# Patient Record
Sex: Male | Born: 1945 | Race: White | Hispanic: No | Marital: Married | Smoking: Never smoker
Health system: Southern US, Community
[De-identification: ages and names within clinical notes are randomized; demographics above are authoritative.]

## PROBLEM LIST (undated history)

## (undated) DIAGNOSIS — I480 Paroxysmal atrial fibrillation: Secondary | ICD-10-CM

## (undated) DIAGNOSIS — I441 Atrioventricular block, second degree: Secondary | ICD-10-CM

## (undated) DIAGNOSIS — I10 Essential (primary) hypertension: Secondary | ICD-10-CM

## (undated) DIAGNOSIS — E7800 Pure hypercholesterolemia, unspecified: Secondary | ICD-10-CM

## (undated) DIAGNOSIS — J90 Pleural effusion, not elsewhere classified: Secondary | ICD-10-CM

## (undated) HISTORY — DX: Paroxysmal atrial fibrillation: I48.0

## (undated) HISTORY — PX: BIOPSY PROSTATE: PRO28

## (undated) HISTORY — DX: Atrioventricular block, second degree: I44.1

## (undated) HISTORY — DX: Pleural effusion, not elsewhere classified: J90

## (undated) HISTORY — PX: COLONOSCOPY: SHX174

---

## 2014-08-10 DIAGNOSIS — G4710 Hypersomnia, unspecified: Secondary | ICD-10-CM

## 2014-08-10 DIAGNOSIS — G4733 Obstructive sleep apnea (adult) (pediatric): Secondary | ICD-10-CM | POA: Insufficient documentation

## 2014-08-10 DIAGNOSIS — R0683 Snoring: Secondary | ICD-10-CM | POA: Insufficient documentation

## 2014-08-10 HISTORY — DX: Obstructive sleep apnea (adult) (pediatric): G47.33

## 2014-08-10 HISTORY — DX: Snoring: R06.83

## 2014-08-10 HISTORY — DX: Hypersomnia, unspecified: G47.10

## 2016-02-14 DIAGNOSIS — G4733 Obstructive sleep apnea (adult) (pediatric): Secondary | ICD-10-CM | POA: Diagnosis not present

## 2016-02-21 DIAGNOSIS — G4710 Hypersomnia, unspecified: Secondary | ICD-10-CM | POA: Diagnosis not present

## 2016-02-21 DIAGNOSIS — G4733 Obstructive sleep apnea (adult) (pediatric): Secondary | ICD-10-CM | POA: Diagnosis not present

## 2016-02-27 DIAGNOSIS — H348312 Tributary (branch) retinal vein occlusion, right eye, stable: Secondary | ICD-10-CM | POA: Diagnosis not present

## 2016-05-29 DIAGNOSIS — N182 Chronic kidney disease, stage 2 (mild): Secondary | ICD-10-CM | POA: Diagnosis not present

## 2016-05-29 DIAGNOSIS — Z87898 Personal history of other specified conditions: Secondary | ICD-10-CM | POA: Diagnosis not present

## 2016-05-29 DIAGNOSIS — E785 Hyperlipidemia, unspecified: Secondary | ICD-10-CM | POA: Diagnosis not present

## 2016-05-29 DIAGNOSIS — Z79899 Other long term (current) drug therapy: Secondary | ICD-10-CM | POA: Diagnosis not present

## 2016-05-29 DIAGNOSIS — I129 Hypertensive chronic kidney disease with stage 1 through stage 4 chronic kidney disease, or unspecified chronic kidney disease: Secondary | ICD-10-CM | POA: Diagnosis not present

## 2016-09-11 DIAGNOSIS — Z23 Encounter for immunization: Secondary | ICD-10-CM | POA: Diagnosis not present

## 2016-09-25 DIAGNOSIS — E785 Hyperlipidemia, unspecified: Secondary | ICD-10-CM | POA: Diagnosis not present

## 2016-09-25 DIAGNOSIS — I10 Essential (primary) hypertension: Secondary | ICD-10-CM | POA: Diagnosis not present

## 2016-09-25 DIAGNOSIS — Z87898 Personal history of other specified conditions: Secondary | ICD-10-CM | POA: Diagnosis not present

## 2016-09-25 DIAGNOSIS — M79674 Pain in right toe(s): Secondary | ICD-10-CM | POA: Diagnosis not present

## 2016-12-25 DIAGNOSIS — Z125 Encounter for screening for malignant neoplasm of prostate: Secondary | ICD-10-CM | POA: Diagnosis not present

## 2016-12-25 DIAGNOSIS — E785 Hyperlipidemia, unspecified: Secondary | ICD-10-CM | POA: Diagnosis not present

## 2016-12-25 DIAGNOSIS — N182 Chronic kidney disease, stage 2 (mild): Secondary | ICD-10-CM | POA: Diagnosis not present

## 2016-12-25 DIAGNOSIS — Z1389 Encounter for screening for other disorder: Secondary | ICD-10-CM | POA: Diagnosis not present

## 2016-12-25 DIAGNOSIS — Z0000 Encounter for general adult medical examination without abnormal findings: Secondary | ICD-10-CM | POA: Diagnosis not present

## 2016-12-25 DIAGNOSIS — Z79899 Other long term (current) drug therapy: Secondary | ICD-10-CM | POA: Diagnosis not present

## 2016-12-25 DIAGNOSIS — R946 Abnormal results of thyroid function studies: Secondary | ICD-10-CM | POA: Diagnosis not present

## 2016-12-25 DIAGNOSIS — Z1211 Encounter for screening for malignant neoplasm of colon: Secondary | ICD-10-CM | POA: Diagnosis not present

## 2016-12-25 DIAGNOSIS — Z9181 History of falling: Secondary | ICD-10-CM | POA: Diagnosis not present

## 2016-12-25 DIAGNOSIS — I129 Hypertensive chronic kidney disease with stage 1 through stage 4 chronic kidney disease, or unspecified chronic kidney disease: Secondary | ICD-10-CM | POA: Diagnosis not present

## 2016-12-31 DIAGNOSIS — Z1389 Encounter for screening for other disorder: Secondary | ICD-10-CM | POA: Diagnosis not present

## 2016-12-31 DIAGNOSIS — E785 Hyperlipidemia, unspecified: Secondary | ICD-10-CM | POA: Diagnosis not present

## 2016-12-31 DIAGNOSIS — Z0000 Encounter for general adult medical examination without abnormal findings: Secondary | ICD-10-CM | POA: Diagnosis not present

## 2016-12-31 DIAGNOSIS — Z136 Encounter for screening for cardiovascular disorders: Secondary | ICD-10-CM | POA: Diagnosis not present

## 2017-01-11 DIAGNOSIS — N401 Enlarged prostate with lower urinary tract symptoms: Secondary | ICD-10-CM | POA: Diagnosis not present

## 2017-01-11 DIAGNOSIS — R972 Elevated prostate specific antigen [PSA]: Secondary | ICD-10-CM | POA: Diagnosis not present

## 2017-02-18 DIAGNOSIS — G4733 Obstructive sleep apnea (adult) (pediatric): Secondary | ICD-10-CM | POA: Diagnosis not present

## 2017-02-19 DIAGNOSIS — G4733 Obstructive sleep apnea (adult) (pediatric): Secondary | ICD-10-CM | POA: Diagnosis not present

## 2017-03-11 DIAGNOSIS — H348312 Tributary (branch) retinal vein occlusion, right eye, stable: Secondary | ICD-10-CM | POA: Diagnosis not present

## 2017-03-12 DIAGNOSIS — G4733 Obstructive sleep apnea (adult) (pediatric): Secondary | ICD-10-CM | POA: Diagnosis not present

## 2017-04-06 DIAGNOSIS — N401 Enlarged prostate with lower urinary tract symptoms: Secondary | ICD-10-CM | POA: Diagnosis not present

## 2017-04-06 DIAGNOSIS — D075 Carcinoma in situ of prostate: Secondary | ICD-10-CM | POA: Diagnosis not present

## 2017-04-06 DIAGNOSIS — R972 Elevated prostate specific antigen [PSA]: Secondary | ICD-10-CM | POA: Diagnosis not present

## 2017-04-13 DIAGNOSIS — R972 Elevated prostate specific antigen [PSA]: Secondary | ICD-10-CM | POA: Diagnosis not present

## 2017-04-13 DIAGNOSIS — N401 Enlarged prostate with lower urinary tract symptoms: Secondary | ICD-10-CM | POA: Diagnosis not present

## 2017-05-28 DIAGNOSIS — Z79899 Other long term (current) drug therapy: Secondary | ICD-10-CM | POA: Diagnosis not present

## 2017-05-28 DIAGNOSIS — N182 Chronic kidney disease, stage 2 (mild): Secondary | ICD-10-CM | POA: Diagnosis not present

## 2017-05-28 DIAGNOSIS — I129 Hypertensive chronic kidney disease with stage 1 through stage 4 chronic kidney disease, or unspecified chronic kidney disease: Secondary | ICD-10-CM | POA: Diagnosis not present

## 2017-05-28 DIAGNOSIS — E785 Hyperlipidemia, unspecified: Secondary | ICD-10-CM | POA: Diagnosis not present

## 2017-07-05 DIAGNOSIS — G4733 Obstructive sleep apnea (adult) (pediatric): Secondary | ICD-10-CM | POA: Diagnosis not present

## 2017-07-15 DIAGNOSIS — R972 Elevated prostate specific antigen [PSA]: Secondary | ICD-10-CM | POA: Diagnosis not present

## 2017-09-10 DIAGNOSIS — Z23 Encounter for immunization: Secondary | ICD-10-CM | POA: Diagnosis not present

## 2017-10-20 DIAGNOSIS — R972 Elevated prostate specific antigen [PSA]: Secondary | ICD-10-CM | POA: Diagnosis not present

## 2017-10-20 DIAGNOSIS — N401 Enlarged prostate with lower urinary tract symptoms: Secondary | ICD-10-CM | POA: Diagnosis not present

## 2017-12-14 DIAGNOSIS — G4733 Obstructive sleep apnea (adult) (pediatric): Secondary | ICD-10-CM | POA: Diagnosis not present

## 2017-12-29 DIAGNOSIS — Z125 Encounter for screening for malignant neoplasm of prostate: Secondary | ICD-10-CM | POA: Diagnosis not present

## 2017-12-29 DIAGNOSIS — R946 Abnormal results of thyroid function studies: Secondary | ICD-10-CM | POA: Diagnosis not present

## 2017-12-29 DIAGNOSIS — I129 Hypertensive chronic kidney disease with stage 1 through stage 4 chronic kidney disease, or unspecified chronic kidney disease: Secondary | ICD-10-CM | POA: Diagnosis not present

## 2017-12-29 DIAGNOSIS — Z1211 Encounter for screening for malignant neoplasm of colon: Secondary | ICD-10-CM | POA: Diagnosis not present

## 2017-12-29 DIAGNOSIS — Z1331 Encounter for screening for depression: Secondary | ICD-10-CM | POA: Diagnosis not present

## 2017-12-29 DIAGNOSIS — Z0000 Encounter for general adult medical examination without abnormal findings: Secondary | ICD-10-CM | POA: Diagnosis not present

## 2017-12-29 DIAGNOSIS — E785 Hyperlipidemia, unspecified: Secondary | ICD-10-CM | POA: Diagnosis not present

## 2018-01-04 DIAGNOSIS — E785 Hyperlipidemia, unspecified: Secondary | ICD-10-CM | POA: Diagnosis not present

## 2018-01-04 DIAGNOSIS — Z136 Encounter for screening for cardiovascular disorders: Secondary | ICD-10-CM | POA: Diagnosis not present

## 2018-01-04 DIAGNOSIS — Z1331 Encounter for screening for depression: Secondary | ICD-10-CM | POA: Diagnosis not present

## 2018-01-04 DIAGNOSIS — Z0000 Encounter for general adult medical examination without abnormal findings: Secondary | ICD-10-CM | POA: Diagnosis not present

## 2018-02-17 DIAGNOSIS — R972 Elevated prostate specific antigen [PSA]: Secondary | ICD-10-CM | POA: Diagnosis not present

## 2018-02-17 DIAGNOSIS — N401 Enlarged prostate with lower urinary tract symptoms: Secondary | ICD-10-CM | POA: Diagnosis not present

## 2018-03-02 ENCOUNTER — Other Ambulatory Visit (HOSPITAL_COMMUNITY): Payer: Self-pay | Admitting: Urology

## 2018-03-02 DIAGNOSIS — R972 Elevated prostate specific antigen [PSA]: Secondary | ICD-10-CM

## 2018-03-22 ENCOUNTER — Ambulatory Visit (HOSPITAL_COMMUNITY)
Admission: RE | Admit: 2018-03-22 | Discharge: 2018-03-22 | Disposition: A | Discharge status: Home / Self Care | Payer: Medicare Other | Source: Ambulatory Visit | Attending: Urology | Admitting: Urology

## 2018-03-22 DIAGNOSIS — K4090 Unilateral inguinal hernia, without obstruction or gangrene, not specified as recurrent: Secondary | ICD-10-CM | POA: Insufficient documentation

## 2018-03-22 DIAGNOSIS — N400 Enlarged prostate without lower urinary tract symptoms: Secondary | ICD-10-CM | POA: Diagnosis not present

## 2018-03-22 DIAGNOSIS — R972 Elevated prostate specific antigen [PSA]: Secondary | ICD-10-CM

## 2018-03-22 LAB — POCT I-STAT CREATININE: CREATININE: 1 mg/dL (ref 0.61–1.24)

## 2018-03-22 MED ORDER — GADOBENATE DIMEGLUMINE 529 MG/ML IV SOLN
20.0000 mL | Freq: Once | INTRAVENOUS | Status: AC | PRN
  Administered 2018-03-22: 15 mL via INTRAVENOUS

## 2018-03-24 DIAGNOSIS — H348312 Tributary (branch) retinal vein occlusion, right eye, stable: Secondary | ICD-10-CM | POA: Diagnosis not present

## 2018-04-11 DIAGNOSIS — K5730 Diverticulosis of large intestine without perforation or abscess without bleeding: Secondary | ICD-10-CM | POA: Diagnosis not present

## 2018-04-11 DIAGNOSIS — K644 Residual hemorrhoidal skin tags: Secondary | ICD-10-CM | POA: Diagnosis not present

## 2018-05-10 DIAGNOSIS — Z7982 Long term (current) use of aspirin: Secondary | ICD-10-CM | POA: Diagnosis not present

## 2018-05-10 DIAGNOSIS — Z79899 Other long term (current) drug therapy: Secondary | ICD-10-CM | POA: Diagnosis not present

## 2018-05-10 DIAGNOSIS — Z86010 Personal history of colonic polyps: Secondary | ICD-10-CM | POA: Diagnosis not present

## 2018-05-10 DIAGNOSIS — K5730 Diverticulosis of large intestine without perforation or abscess without bleeding: Secondary | ICD-10-CM | POA: Diagnosis not present

## 2018-05-10 DIAGNOSIS — D1779 Benign lipomatous neoplasm of other sites: Secondary | ICD-10-CM | POA: Diagnosis not present

## 2018-05-10 DIAGNOSIS — K635 Polyp of colon: Secondary | ICD-10-CM | POA: Diagnosis not present

## 2018-05-10 DIAGNOSIS — I10 Essential (primary) hypertension: Secondary | ICD-10-CM | POA: Diagnosis not present

## 2018-05-10 DIAGNOSIS — D120 Benign neoplasm of cecum: Secondary | ICD-10-CM | POA: Diagnosis not present

## 2018-05-10 DIAGNOSIS — E7800 Pure hypercholesterolemia, unspecified: Secondary | ICD-10-CM | POA: Diagnosis not present

## 2018-05-10 DIAGNOSIS — D126 Benign neoplasm of colon, unspecified: Secondary | ICD-10-CM | POA: Diagnosis not present

## 2018-05-10 DIAGNOSIS — Z1211 Encounter for screening for malignant neoplasm of colon: Secondary | ICD-10-CM | POA: Diagnosis not present

## 2018-05-10 DIAGNOSIS — K648 Other hemorrhoids: Secondary | ICD-10-CM | POA: Diagnosis not present

## 2018-05-10 DIAGNOSIS — G4730 Sleep apnea, unspecified: Secondary | ICD-10-CM | POA: Diagnosis not present

## 2018-05-16 DIAGNOSIS — G4733 Obstructive sleep apnea (adult) (pediatric): Secondary | ICD-10-CM | POA: Diagnosis not present

## 2018-05-17 DIAGNOSIS — R0683 Snoring: Secondary | ICD-10-CM | POA: Diagnosis not present

## 2018-05-17 DIAGNOSIS — G4733 Obstructive sleep apnea (adult) (pediatric): Secondary | ICD-10-CM | POA: Diagnosis not present

## 2018-06-03 DIAGNOSIS — G4733 Obstructive sleep apnea (adult) (pediatric): Secondary | ICD-10-CM | POA: Diagnosis not present

## 2018-06-21 DIAGNOSIS — R972 Elevated prostate specific antigen [PSA]: Secondary | ICD-10-CM | POA: Diagnosis not present

## 2018-06-21 DIAGNOSIS — N401 Enlarged prostate with lower urinary tract symptoms: Secondary | ICD-10-CM | POA: Diagnosis not present

## 2018-06-24 DIAGNOSIS — I129 Hypertensive chronic kidney disease with stage 1 through stage 4 chronic kidney disease, or unspecified chronic kidney disease: Secondary | ICD-10-CM | POA: Diagnosis not present

## 2018-06-24 DIAGNOSIS — Z1339 Encounter for screening examination for other mental health and behavioral disorders: Secondary | ICD-10-CM | POA: Diagnosis not present

## 2018-06-24 DIAGNOSIS — E785 Hyperlipidemia, unspecified: Secondary | ICD-10-CM | POA: Diagnosis not present

## 2018-06-24 DIAGNOSIS — R739 Hyperglycemia, unspecified: Secondary | ICD-10-CM | POA: Diagnosis not present

## 2018-07-15 DIAGNOSIS — H6121 Impacted cerumen, right ear: Secondary | ICD-10-CM | POA: Diagnosis not present

## 2018-09-02 DIAGNOSIS — Z23 Encounter for immunization: Secondary | ICD-10-CM | POA: Diagnosis not present

## 2018-10-03 DIAGNOSIS — G4733 Obstructive sleep apnea (adult) (pediatric): Secondary | ICD-10-CM | POA: Diagnosis not present

## 2018-10-25 DIAGNOSIS — N401 Enlarged prostate with lower urinary tract symptoms: Secondary | ICD-10-CM | POA: Diagnosis not present

## 2018-10-25 DIAGNOSIS — R69 Illness, unspecified: Secondary | ICD-10-CM | POA: Diagnosis not present

## 2018-10-25 DIAGNOSIS — R972 Elevated prostate specific antigen [PSA]: Secondary | ICD-10-CM | POA: Diagnosis not present

## 2019-01-06 DIAGNOSIS — G4733 Obstructive sleep apnea (adult) (pediatric): Secondary | ICD-10-CM | POA: Diagnosis not present

## 2019-02-15 DIAGNOSIS — R972 Elevated prostate specific antigen [PSA]: Secondary | ICD-10-CM | POA: Diagnosis not present

## 2019-02-15 DIAGNOSIS — N401 Enlarged prostate with lower urinary tract symptoms: Secondary | ICD-10-CM | POA: Diagnosis not present

## 2019-04-12 DIAGNOSIS — G4733 Obstructive sleep apnea (adult) (pediatric): Secondary | ICD-10-CM | POA: Diagnosis not present

## 2019-04-17 DIAGNOSIS — R739 Hyperglycemia, unspecified: Secondary | ICD-10-CM | POA: Diagnosis not present

## 2019-04-17 DIAGNOSIS — I129 Hypertensive chronic kidney disease with stage 1 through stage 4 chronic kidney disease, or unspecified chronic kidney disease: Secondary | ICD-10-CM | POA: Diagnosis not present

## 2019-04-17 DIAGNOSIS — N401 Enlarged prostate with lower urinary tract symptoms: Secondary | ICD-10-CM | POA: Diagnosis not present

## 2019-04-17 DIAGNOSIS — E785 Hyperlipidemia, unspecified: Secondary | ICD-10-CM | POA: Diagnosis not present

## 2019-04-26 DIAGNOSIS — I129 Hypertensive chronic kidney disease with stage 1 through stage 4 chronic kidney disease, or unspecified chronic kidney disease: Secondary | ICD-10-CM | POA: Diagnosis not present

## 2019-04-26 DIAGNOSIS — E785 Hyperlipidemia, unspecified: Secondary | ICD-10-CM | POA: Diagnosis not present

## 2019-04-26 DIAGNOSIS — R739 Hyperglycemia, unspecified: Secondary | ICD-10-CM | POA: Diagnosis not present

## 2019-05-17 DIAGNOSIS — Z9181 History of falling: Secondary | ICD-10-CM | POA: Diagnosis not present

## 2019-05-17 DIAGNOSIS — Z125 Encounter for screening for malignant neoplasm of prostate: Secondary | ICD-10-CM | POA: Diagnosis not present

## 2019-05-17 DIAGNOSIS — Z1331 Encounter for screening for depression: Secondary | ICD-10-CM | POA: Diagnosis not present

## 2019-05-17 DIAGNOSIS — Z0000 Encounter for general adult medical examination without abnormal findings: Secondary | ICD-10-CM | POA: Diagnosis not present

## 2019-05-22 DIAGNOSIS — H348312 Tributary (branch) retinal vein occlusion, right eye, stable: Secondary | ICD-10-CM | POA: Diagnosis not present

## 2019-06-22 DIAGNOSIS — H6120 Impacted cerumen, unspecified ear: Secondary | ICD-10-CM | POA: Diagnosis not present

## 2019-06-22 DIAGNOSIS — I129 Hypertensive chronic kidney disease with stage 1 through stage 4 chronic kidney disease, or unspecified chronic kidney disease: Secondary | ICD-10-CM | POA: Diagnosis not present

## 2019-06-22 DIAGNOSIS — Z139 Encounter for screening, unspecified: Secondary | ICD-10-CM | POA: Diagnosis not present

## 2019-06-22 DIAGNOSIS — N182 Chronic kidney disease, stage 2 (mild): Secondary | ICD-10-CM | POA: Diagnosis not present

## 2019-06-29 DIAGNOSIS — N401 Enlarged prostate with lower urinary tract symptoms: Secondary | ICD-10-CM | POA: Diagnosis not present

## 2019-06-29 DIAGNOSIS — R972 Elevated prostate specific antigen [PSA]: Secondary | ICD-10-CM | POA: Diagnosis not present

## 2019-07-10 DIAGNOSIS — G4733 Obstructive sleep apnea (adult) (pediatric): Secondary | ICD-10-CM | POA: Diagnosis not present

## 2019-07-10 DIAGNOSIS — R0683 Snoring: Secondary | ICD-10-CM | POA: Diagnosis not present

## 2019-07-13 DIAGNOSIS — G4733 Obstructive sleep apnea (adult) (pediatric): Secondary | ICD-10-CM | POA: Diagnosis not present

## 2019-10-04 DIAGNOSIS — G4733 Obstructive sleep apnea (adult) (pediatric): Secondary | ICD-10-CM | POA: Diagnosis not present

## 2019-11-03 DIAGNOSIS — G4733 Obstructive sleep apnea (adult) (pediatric): Secondary | ICD-10-CM | POA: Diagnosis not present

## 2019-11-06 DIAGNOSIS — G4733 Obstructive sleep apnea (adult) (pediatric): Secondary | ICD-10-CM | POA: Diagnosis not present

## 2019-12-04 DIAGNOSIS — G4733 Obstructive sleep apnea (adult) (pediatric): Secondary | ICD-10-CM | POA: Diagnosis not present

## 2019-12-26 DIAGNOSIS — N401 Enlarged prostate with lower urinary tract symptoms: Secondary | ICD-10-CM | POA: Diagnosis not present

## 2019-12-26 DIAGNOSIS — R739 Hyperglycemia, unspecified: Secondary | ICD-10-CM | POA: Diagnosis not present

## 2019-12-26 DIAGNOSIS — Z125 Encounter for screening for malignant neoplasm of prostate: Secondary | ICD-10-CM | POA: Diagnosis not present

## 2019-12-26 DIAGNOSIS — I129 Hypertensive chronic kidney disease with stage 1 through stage 4 chronic kidney disease, or unspecified chronic kidney disease: Secondary | ICD-10-CM | POA: Diagnosis not present

## 2019-12-26 DIAGNOSIS — N182 Chronic kidney disease, stage 2 (mild): Secondary | ICD-10-CM | POA: Diagnosis not present

## 2019-12-26 DIAGNOSIS — E785 Hyperlipidemia, unspecified: Secondary | ICD-10-CM | POA: Diagnosis not present

## 2019-12-28 DIAGNOSIS — N401 Enlarged prostate with lower urinary tract symptoms: Secondary | ICD-10-CM | POA: Diagnosis not present

## 2019-12-28 DIAGNOSIS — R972 Elevated prostate specific antigen [PSA]: Secondary | ICD-10-CM | POA: Diagnosis not present

## 2020-01-04 DIAGNOSIS — G4733 Obstructive sleep apnea (adult) (pediatric): Secondary | ICD-10-CM | POA: Diagnosis not present

## 2020-02-06 DIAGNOSIS — G4733 Obstructive sleep apnea (adult) (pediatric): Secondary | ICD-10-CM | POA: Diagnosis not present

## 2020-03-08 DIAGNOSIS — H903 Sensorineural hearing loss, bilateral: Secondary | ICD-10-CM | POA: Diagnosis not present

## 2020-05-23 DIAGNOSIS — H348312 Tributary (branch) retinal vein occlusion, right eye, stable: Secondary | ICD-10-CM | POA: Diagnosis not present

## 2020-05-23 DIAGNOSIS — H2513 Age-related nuclear cataract, bilateral: Secondary | ICD-10-CM | POA: Diagnosis not present

## 2020-05-28 DIAGNOSIS — Z9181 History of falling: Secondary | ICD-10-CM | POA: Diagnosis not present

## 2020-05-28 DIAGNOSIS — Z1331 Encounter for screening for depression: Secondary | ICD-10-CM | POA: Diagnosis not present

## 2020-05-28 DIAGNOSIS — E785 Hyperlipidemia, unspecified: Secondary | ICD-10-CM | POA: Diagnosis not present

## 2020-05-28 DIAGNOSIS — Z0000 Encounter for general adult medical examination without abnormal findings: Secondary | ICD-10-CM | POA: Diagnosis not present

## 2020-07-08 DIAGNOSIS — N401 Enlarged prostate with lower urinary tract symptoms: Secondary | ICD-10-CM | POA: Diagnosis not present

## 2020-07-08 DIAGNOSIS — E785 Hyperlipidemia, unspecified: Secondary | ICD-10-CM | POA: Diagnosis not present

## 2020-07-08 DIAGNOSIS — R739 Hyperglycemia, unspecified: Secondary | ICD-10-CM | POA: Diagnosis not present

## 2020-07-08 DIAGNOSIS — N182 Chronic kidney disease, stage 2 (mild): Secondary | ICD-10-CM | POA: Diagnosis not present

## 2020-07-08 DIAGNOSIS — I129 Hypertensive chronic kidney disease with stage 1 through stage 4 chronic kidney disease, or unspecified chronic kidney disease: Secondary | ICD-10-CM | POA: Diagnosis not present

## 2020-07-09 DIAGNOSIS — G4733 Obstructive sleep apnea (adult) (pediatric): Secondary | ICD-10-CM | POA: Diagnosis not present

## 2020-07-09 DIAGNOSIS — R0683 Snoring: Secondary | ICD-10-CM | POA: Diagnosis not present

## 2020-07-10 DIAGNOSIS — N401 Enlarged prostate with lower urinary tract symptoms: Secondary | ICD-10-CM | POA: Diagnosis not present

## 2020-07-10 DIAGNOSIS — R972 Elevated prostate specific antigen [PSA]: Secondary | ICD-10-CM | POA: Diagnosis not present

## 2020-07-24 DIAGNOSIS — R7989 Other specified abnormal findings of blood chemistry: Secondary | ICD-10-CM | POA: Diagnosis not present

## 2020-09-16 ENCOUNTER — Inpatient Hospital Stay (HOSPITAL_COMMUNITY)
Admission: EM | Admit: 2020-09-16 | Discharge: 2020-09-18 | DRG: 244 | Disposition: A | Discharge status: Home / Self Care | Payer: Medicare Other | Attending: Cardiology | Admitting: Cardiology

## 2020-09-16 ENCOUNTER — Encounter (HOSPITAL_COMMUNITY): Payer: Self-pay

## 2020-09-16 ENCOUNTER — Emergency Department (HOSPITAL_COMMUNITY): Payer: Medicare Other

## 2020-09-16 ENCOUNTER — Other Ambulatory Visit: Payer: Self-pay | Admitting: Cardiology

## 2020-09-16 ENCOUNTER — Inpatient Hospital Stay (HOSPITAL_COMMUNITY): Payer: Medicare Other

## 2020-09-16 DIAGNOSIS — Y92009 Unspecified place in unspecified non-institutional (private) residence as the place of occurrence of the external cause: Secondary | ICD-10-CM | POA: Diagnosis not present

## 2020-09-16 DIAGNOSIS — I10 Essential (primary) hypertension: Secondary | ICD-10-CM | POA: Diagnosis present

## 2020-09-16 DIAGNOSIS — N400 Enlarged prostate without lower urinary tract symptoms: Secondary | ICD-10-CM | POA: Diagnosis present

## 2020-09-16 DIAGNOSIS — R55 Syncope and collapse: Secondary | ICD-10-CM | POA: Diagnosis not present

## 2020-09-16 DIAGNOSIS — E785 Hyperlipidemia, unspecified: Secondary | ICD-10-CM | POA: Diagnosis present

## 2020-09-16 DIAGNOSIS — E7800 Pure hypercholesterolemia, unspecified: Secondary | ICD-10-CM | POA: Diagnosis present

## 2020-09-16 DIAGNOSIS — Z7982 Long term (current) use of aspirin: Secondary | ICD-10-CM

## 2020-09-16 DIAGNOSIS — Z79899 Other long term (current) drug therapy: Secondary | ICD-10-CM

## 2020-09-16 DIAGNOSIS — S0083XA Contusion of other part of head, initial encounter: Secondary | ICD-10-CM | POA: Diagnosis present

## 2020-09-16 DIAGNOSIS — Z83438 Family history of other disorder of lipoprotein metabolism and other lipidemia: Secondary | ICD-10-CM

## 2020-09-16 DIAGNOSIS — W1830XA Fall on same level, unspecified, initial encounter: Secondary | ICD-10-CM | POA: Diagnosis present

## 2020-09-16 DIAGNOSIS — I442 Atrioventricular block, complete: Secondary | ICD-10-CM

## 2020-09-16 DIAGNOSIS — Z8249 Family history of ischemic heart disease and other diseases of the circulatory system: Secondary | ICD-10-CM | POA: Diagnosis not present

## 2020-09-16 DIAGNOSIS — Z20822 Contact with and (suspected) exposure to covid-19: Secondary | ICD-10-CM | POA: Diagnosis present

## 2020-09-16 DIAGNOSIS — Z959 Presence of cardiac and vascular implant and graft, unspecified: Secondary | ICD-10-CM

## 2020-09-16 HISTORY — DX: Essential (primary) hypertension: I10

## 2020-09-16 HISTORY — DX: Pure hypercholesterolemia, unspecified: E78.00

## 2020-09-16 HISTORY — DX: Atrioventricular block, complete: I44.2

## 2020-09-16 LAB — BASIC METABOLIC PANEL
Anion gap: 10 (ref 5–15)
BUN: 20 mg/dL (ref 8–23)
CO2: 25 mmol/L (ref 22–32)
Calcium: 8.6 mg/dL — ABNORMAL LOW (ref 8.9–10.3)
Chloride: 101 mmol/L (ref 98–111)
Creatinine, Ser: 1.09 mg/dL (ref 0.61–1.24)
GFR, Estimated: >60 mL/min (ref >60)
Glucose, Bld: 111 mg/dL — ABNORMAL HIGH (ref 70–99)
Potassium: 4.4 mmol/L (ref 3.5–5.1)
Sodium: 136 mmol/L (ref 135–145)

## 2020-09-16 LAB — COMPREHENSIVE METABOLIC PANEL
ALT: 67 U/L — ABNORMAL HIGH (ref 0–44)
AST: 32 U/L (ref 15–41)
Albumin: 3.2 g/dL — ABNORMAL LOW (ref 3.5–5.0)
Alkaline Phosphatase: 51 U/L (ref 38–126)
Anion gap: 9 (ref 5–15)
BUN: 17 mg/dL (ref 8–23)
CO2: 24 mmol/L (ref 22–32)
Calcium: 8.4 mg/dL — ABNORMAL LOW (ref 8.9–10.3)
Chloride: 102 mmol/L (ref 98–111)
Creatinine, Ser: 1 mg/dL (ref 0.61–1.24)
GFR, Estimated: >60 mL/min (ref >60)
Glucose, Bld: 126 mg/dL — ABNORMAL HIGH (ref 70–99)
Potassium: 4 mmol/L (ref 3.5–5.1)
Sodium: 135 mmol/L (ref 135–145)
Total Bilirubin: 0.9 mg/dL (ref 0.3–1.2)
Total Protein: 5.8 g/dL — ABNORMAL LOW (ref 6.5–8.1)

## 2020-09-16 LAB — CBC
HCT: 41.8 % (ref 39.0–52.0)
HCT: 39.8 % (ref 39.0–52.0)
Hemoglobin: 13.8 g/dL (ref 13.0–17.0)
Hemoglobin: 13.3 g/dL (ref 13.0–17.0)
MCH: 31.1 pg (ref 26.0–34.0)
MCH: 31 pg (ref 26.0–34.0)
MCHC: 33 g/dL (ref 30.0–36.0)
MCHC: 33.4 g/dL (ref 30.0–36.0)
MCV: 94.1 fL (ref 80.0–100.0)
MCV: 92.8 fL (ref 80.0–100.0)
Platelets: 193 10*3/uL (ref 150–400)
Platelets: 174 10*3/uL (ref 150–400)
RBC: 4.44 MIL/uL (ref 4.22–5.81)
RBC: 4.29 MIL/uL (ref 4.22–5.81)
RDW: 12.5 % (ref 11.5–15.5)
RDW: 12.7 % (ref 11.5–15.5)
WBC: 9 10*3/uL (ref 4.0–10.5)
WBC: 11.5 10*3/uL — ABNORMAL HIGH (ref 4.0–10.5)
nRBC: 0 % (ref 0.0–0.2)
nRBC: 0 % (ref 0.0–0.2)

## 2020-09-16 LAB — RESPIRATORY PANEL BY RT PCR (FLU A&B, COVID)
Influenza A by PCR: NEGATIVE
Influenza B by PCR: NEGATIVE
SARS Coronavirus 2 by RT PCR: NEGATIVE

## 2020-09-16 LAB — TSH: TSH: 2.953 u[IU]/mL (ref 0.350–4.500)

## 2020-09-16 MED ORDER — HEPARIN SODIUM (PORCINE) 5000 UNIT/ML IJ SOLN
5000.0000 [IU] | Freq: Three times a day (TID) | INTRAMUSCULAR | Status: DC
  Administered 2020-09-16 – 2020-09-17 (×2): 5000 [IU] via SUBCUTANEOUS
  Filled 2020-09-16 (×2): qty 1

## 2020-09-16 MED ORDER — ONDANSETRON HCL 4 MG/2ML IJ SOLN: 4.0000 mg | Freq: Four times a day (QID) | INTRAMUSCULAR | Status: DC | PRN

## 2020-09-16 MED ORDER — SODIUM CHLORIDE 0.9 % IV SOLN: INTRAVENOUS | Status: DC

## 2020-09-16 MED ORDER — SODIUM CHLORIDE 0.9% FLUSH: 3.0000 mL | INTRAVENOUS | Status: DC | PRN

## 2020-09-16 MED ORDER — HYDRALAZINE HCL 25 MG PO TABS: 25.0000 mg | ORAL_TABLET | Freq: Three times a day (TID) | ORAL | Status: DC | PRN

## 2020-09-16 MED ORDER — ACETAMINOPHEN 325 MG PO TABS: 650.0000 mg | ORAL_TABLET | ORAL | Status: DC | PRN

## 2020-09-16 MED ORDER — SODIUM CHLORIDE 0.9% FLUSH
3.0000 mL | Freq: Two times a day (BID) | INTRAVENOUS | Status: DC
  Administered 2020-09-16: 3 mL via INTRAVENOUS

## 2020-09-16 MED ORDER — SODIUM CHLORIDE 0.9 % IV SOLN: 250.0000 mL | INTRAVENOUS | Status: DC | PRN

## 2020-09-16 NOTE — ED Provider Notes (Addendum)
[facility] EMERGENCY DEPARTMENT Provider Note   CSN: [id number] Arrival date & time: 09/16/20  1714     History Chief Complaint  Patient presents with  . Fall    Ricky Hood is a 74 y.o. male.  HPI Patient here for evaluation of fall with suspected syncope.  The patient was walking to his car, when his wife heard him fall.  He reportedly fell face first, while he was walking down some steps.  His wife describes a ground-level fall.  Patient was able to ambulate, afterwards, and presents for evaluation here by EMS with a cervical collar on.  The patient had been going to see his doctor today, when he fell.  He has had periods of high and low blood pressure, diastolic only, when he measures it.  [place] and had more difficulty doing that than usual and felt short of breath while he was walking.  He is not having chest pain.  He denies headache, weakness or paresthesia.  No prior similar problems.  He is taking his usual medications as directed.  He denies fever, chills, cough, shortness of breath, abdominal pain, nausea or vomiting.  There have been no change or symptoms with defecation and urination.  There are no other known modifying factors.  Past Medical History:  Diagnosis Date  . Hypercholesteremia   . Hypertension       History reviewed. No pertinent surgical history.     No family history on file.  Social History   Tobacco Use  . Smoking status: Not on file  Substance Use Topics  . Alcohol use: Not on file  . Drug use: Not on file    Home Medications Prior to Admission medications   Medication Sig Start Date End Date Taking? Authorizing Provider  aspirin 81 MG EC tablet Take 81 mg by mouth at bedtime.    Yes [provider]  Cholecalciferol 50 MCG (2000 UT) CHEW Chew 2,000 Units by mouth at bedtime.    Yes [provider]  rosuvastatin (CRESTOR) 5 MG tablet Take 5 mg by mouth at  bedtime.  07/26/20  Yes [provider]  TART CHERRY PO Take 10 mLs by mouth in the morning and at bedtime.   Yes [provider]  finasteride (PROSCAR) 5 MG tablet Take by mouth.    [provider]  fish oil-omega-3 fatty acids 1000 MG capsule Take by mouth.    [provider]  lisinopril (ZESTRIL) 10 MG tablet Take by mouth.    [provider]  loratadine (CLARITIN) 10 MG tablet Take by mouth.    [provider]  metaproterenol (ALUPENT) 10 MG tablet Take by mouth.    [provider]    Allergies    Patient has no known allergies.  Review of Systems   Review of Systems  All other systems reviewed and are negative.   Physical Exam Updated Vital Signs BP 138/75   Pulse (!) 28   Temp 98.7 F (37.1 C) (Oral)   Resp 20   SpO2 99%   Physical Exam Vitals and nursing note reviewed.  Constitutional:      General: He is not in acute distress.    Appearance: He is well-developed and normal weight. He is not ill-appearing.  HENT:     Head: Normocephalic.     Comments: Minor contusions midface, nose and cheeks.  No instability or crepitation.  No trismus.  No apparent  dental injury.  No other injuries of the head appreciated.    Right Ear: External ear normal.     Left Ear: External ear normal.     Nose: No congestion.     Comments: No nosebleeding Eyes:     Conjunctiva/sclera: Conjunctivae normal.     Pupils: Pupils are equal, round, and reactive to light.  Neck:     Trachea: Phonation normal.  Cardiovascular:     Rate and Rhythm: Normal rate and regular rhythm.     Heart sounds: Normal heart sounds.  Pulmonary:     Effort: Pulmonary effort is normal.     Breath sounds: Normal breath sounds.  Abdominal:     Palpations: Abdomen is soft.     Tenderness: There is no abdominal tenderness.  Musculoskeletal:        General: No swelling, tenderness, deformity or signs of injury. Normal range of motion.     Cervical  back: Normal range of motion and neck supple.  Skin:    General: Skin is warm and dry.  Neurological:     Mental Status: He is alert and oriented to person, place, and time.     Cranial Nerves: No cranial nerve deficit.     Sensory: No sensory deficit.     Motor: No abnormal muscle tone.     Coordination: Coordination normal.  Psychiatric:        Mood and Affect: Mood normal.        Behavior: Behavior normal.        Thought Content: Thought content normal.        Judgment: Judgment normal.     ED Results / Procedures / Treatments   Labs (all labs ordered are listed, but only abnormal results are displayed) Labs Reviewed  BASIC METABOLIC PANEL - Abnormal; Notable for the following components:      Result Value   Glucose, Bld 111 (*)    Calcium 8.6 (*)    All other components within normal limits  RESPIRATORY PANEL BY RT PCR (FLU A&B, COVID)  CBC    EKG EKG Interpretation  Date/Time:  Monday September 16 2020 17:46:09 EDT Ventricular Rate:  36 PR Interval:  206 QRS Duration: 126 QT Interval:  452 QTC Calculation: 349 R Axis:   -129 Text Interpretation: 3rd degree heart block Right superior axis deviation Non-specific intra-ventricular conduction block Cannot rule out Anteroseptal infarct , age undetermined Abnormal ECG No old tracing for comparison Confirmed by Alona Bene [phone]) on 09/16/2020 5:53:18 PM   Radiology CT Head Wo Contrast  Result Date: 09/16/2020 CLINICAL DATA:  Fall EXAM: CT HEAD WITHOUT CONTRAST TECHNIQUE: Contiguous axial images were obtained from the base of the skull through the vertex without intravenous contrast. COMPARISON:  None. FINDINGS: Brain: There is atrophy and chronic small vessel disease changes. No acute intracranial abnormality. Specifically, no hemorrhage, hydrocephalus, mass lesion, acute infarction, or significant intracranial injury. Vascular: No hyperdense vessel or unexpected calcification. Skull: No acute calvarial abnormality.  Sinuses/Orbits: Air-fluid levels within the maxillary sinuses bilaterally. Other: None IMPRESSION: Atrophy, chronic microvascular disease. No acute intracranial abnormality. Bilateral acute maxillary sinusitis. Electronically Signed   By: Charlett Nose M.D.   On: 09/16/2020 19:19   CT Cervical Spine Wo Contrast  Result Date: 09/16/2020 CLINICAL DATA:  Fall EXAM: CT CERVICAL SPINE WITHOUT CONTRAST TECHNIQUE: Multidetector CT imaging of the cervical spine was performed without intravenous contrast. Multiplanar CT image reconstructions were also generated. COMPARISON:  None. FINDINGS: Alignment: No subluxation. Skull base and  vertebrae: No acute fracture. No primary bone lesion or focal pathologic process. Soft tissues and spinal canal: No prevertebral fluid or swelling. No visible canal hematoma. 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 Upper chest: No acute findings Other: None IMPRESSION: Diffuse degenerative disc and facet disease. No acute bony abnormality. Electronically Signed   By: Charlett Nose M.D.   On: 09/16/2020 19:22   CT Maxillofacial WO CM  Result Date: 09/16/2020 CLINICAL DATA:  Fall, facial trauma.  Laceration under right eye EXAM: CT MAXILLOFACIAL WITHOUT CONTRAST TECHNIQUE: Multidetector CT imaging of the maxillofacial structures was performed. Multiplanar CT image reconstructions were also generated. COMPARISON:  None. FINDINGS: Osseous: No fracture or mandibular dislocation. No destructive process. Orbits: Negative. No traumatic or inflammatory finding. Sinuses: Air-fluid level seen within both maxillary sinuses. Mild mucosal thickening in the ethmoid air cells. Mastoid air cells clear. Soft tissues: Mild soft tissue swelling in the right cheek region. Limited intracranial: No acute findings IMPRESSION: No facial or orbital fracture. Bilateral acute maxillary sinusitis. Electronically Signed   By: Charlett Nose M.D.   On: 09/16/2020 19:21     Procedures .Critical Care Performed by: Mancel Bale, MD Authorized by: Mancel Bale, MD   Critical care provider statement:    Critical care time (minutes):  35   Critical care start time:  09/16/2020 7:10 PM   Critical care end time:  09/16/2020 9:59 PM   Critical care time was exclusive of:  Separately billable procedures and treating other patients   Critical care was necessary to treat or prevent imminent or life-threatening deterioration of the following conditions:  Cardiac failure   Critical care was time spent personally by me on the following activities:  Blood draw for specimens, development of treatment plan with patient or surrogate, discussions with consultants, evaluation of patient's response to treatment, examination of patient, obtaining history from patient or surrogate, ordering and performing treatments and interventions, ordering and review of laboratory studies, pulse oximetry, re-evaluation of patient's condition, review of old charts and ordering and review of radiographic studies   (including critical care time)  Medications Ordered in ED Medications  0.9 %  sodium chloride infusion ( Intravenous New Bag/Given 09/16/20 1841)    ED Course  I have reviewed the triage vital signs and the nursing notes.  Pertinent labs & imaging results that were available during my care of the patient were reviewed by me and considered in my medical decision making (see chart for details).  Clinical Course as of Sep 16 2199  Mon Sep 16, 2020  2008 Normal  Respiratory Panel by RT PCR (Flu A&B, Covid) - Nasopharyngeal Swab [EW]  2008 Normal except glucose high, calcium low  Basic metabolic panel(!) [EW]  2008 Normal  CBC [EW]  2017 normal  Respiratory Panel by RT PCR (Flu A&B, Covid) - Nasopharyngeal Swab [EW]    Clinical Course User Index [EW] Mancel Bale, MD   MDM Rules/Calculators/A&P                           Patient Vitals for the past 24 hrs:  BP  Temp Temp src Pulse Resp SpO2  09/16/20 2100 138/75 -- -- (!) 28 20 99 %  09/16/20 2045 -- -- -- (!) 31 17 100 %  09/16/20 2030 -- -- -- (!) 28 (!) 21 97 %  09/16/20 2015 -- -- -- (!) 41 16 98 %  09/16/20 2000 (!) 165/78 -- -- Marland Kitchen  42 19 98 %  09/16/20 1930 (!) 158/75 -- -- (!) 40 (!) 23 99 %  09/16/20 1915 (!) 174/58 -- -- (!) 32 (!) 23 99 %  09/16/20 1819 -- -- -- (!) 31 14 99 %  09/16/20 1818 -- -- -- (!) 30 (!) 27 99 %  09/16/20 1817 -- -- -- (!) 59 (!) 28 99 %  09/16/20 1816 -- -- -- 89 19 98 %  09/16/20 1815 (!) 162/61 -- -- -- 16 --  09/16/20 1814 -- -- -- -- 20 --  09/16/20 1813 -- -- -- -- 16 --  09/16/20 1812 (!) 153/64 -- -- -- 11 --  09/16/20 1718 (!) 146/62 98.7 F (37.1 C) Oral 81 17 96 %    8:26 PM Reevaluation with update and discussion. After initial assessment and treatment, an updated evaluation reveals he remains alert and fairly comfortable.  He is complaining of nasal congestion that started after the facial injury.  He denies preceding sinus symptoms, in the last days weeks or months.  Patient and wife updated on findings and plan. Mancel BaleElliott Gessica Jawad   Medical Decision Making:  This patient is presenting for evaluation of injuries from fall with syncope, which does require a range of treatment options, and is a complaint that involves a high risk of morbidity and mortality. The differential diagnoses include cardiopulmonary instability, traumatic injury to head or cervical spine, metabolic disorder, acute infectious process.  Nursing Notes Reviewed/ Care Coordinated, and agree without changes. Applicable Imaging Reviewed.  Interpretation of Laboratory Data incorporated into ED treatment. I decided to review old records, and in summary patient with history of high blood pressure, and elevated lipids, presenting with dyspnea, weakness and syncope.  He does not take metoprolol or other beta-blockers..  I obtained additional historical information from his wife at the  bedside.  Clinical Laboratory Tests Ordered, included CBC, Metabolic panel and Covid test. Review indicates normal findings. Radiologic Tests Ordered, included CT head, CT cervical spine, CT face.  I independently Visualized: Radiographic images, which show no acute injuries or stroke  Cardiac Monitor Tracing which shows complete heart block, rate around 35, with occasional nonperfused beats.  Critical Interventions-clinical evaluation, laboratory testing, radiologic imaging, EKG monitoring, interpretation of EKG, observation reassessment  After These Interventions, the Patient was reevaluated and was found stable with persistent complete heart block.  No medication etiology discerned.  He requires cardiology evaluation with likely pacemaker placement.  Patient did not require intervention in the ED.  CRITICAL CARE-yes Performed by: Mancel BaleElliott Tiffney Haughton  Nursing Notes Reviewed/ Care Coordinated Applicable Imaging Reviewed Interpretation of Laboratory Data incorporated into ED treatment  8:15 PM-consultation cardiology for admission.  9:45 PM-Dr. Jacinto HalimGanji returned call for unassigned cardiology and will admit the patient. The patient remained stable at this time.    Final Clinical Impression(s) / ED Diagnoses Final diagnoses:  Complete heart block (HCC)  Syncope, unspecified syncope type  Contusion of face, initial encounter    Rx / DC Orders ED Discharge Orders    None           Mancel BaleWentz, Brighten Buzzelli, MD 09/16/20 2200

## 2020-09-16 NOTE — ED Triage Notes (Signed)
Pt from home with ems for a fall, pt does not remember what happened, small lac to under right eye, bleeding controlled, pt only takes a baby ASA. C collar placed in triage but denies neck pain. Pt c.o facial pain

## 2020-09-16 NOTE — Progress Notes (Addendum)
Patient with stable escape rhythm with no hemodynamic compromise and will admit and start Dopa if needed or place temp pacemaker if needed. 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 D/W Dr. Effie Shy. No change in acute care management tonight and will keep NPO for pacemaker placement tomorrow.      Yates Decamp, MD, [facility] 09/16/2020, 10:01 PM Office: [phone] Pager: [phone]

## 2020-09-16 NOTE — ED Notes (Addendum)
This RN got pt hooked up to the cardiac monitor and pt HR dip down the 30s bouncing around in lows 40, This RN notified MD, MD at beside evaluating pt   1900 This RN accompany pt to CT

## 2020-09-17 ENCOUNTER — Inpatient Hospital Stay (HOSPITAL_COMMUNITY): Payer: Medicare Other

## 2020-09-17 ENCOUNTER — Encounter (HOSPITAL_COMMUNITY): Payer: Self-pay | Admitting: Cardiology

## 2020-09-17 ENCOUNTER — Inpatient Hospital Stay (HOSPITAL_COMMUNITY)
Admission: EM | Disposition: A | Discharge status: Home / Self Care | Payer: Self-pay | Source: Home / Self Care | Attending: Cardiology

## 2020-09-17 ENCOUNTER — Other Ambulatory Visit: Payer: Self-pay

## 2020-09-17 DIAGNOSIS — Z950 Presence of cardiac pacemaker: Secondary | ICD-10-CM | POA: Insufficient documentation

## 2020-09-17 DIAGNOSIS — I442 Atrioventricular block, complete: Secondary | ICD-10-CM | POA: Diagnosis not present

## 2020-09-17 HISTORY — DX: Presence of cardiac pacemaker: Z95.0

## 2020-09-17 HISTORY — PX: PACEMAKER IMPLANT: EP1218

## 2020-09-17 HISTORY — PX: INSERT / REPLACE / REMOVE PACEMAKER: SUR710

## 2020-09-17 LAB — ECHOCARDIOGRAM COMPLETE
Area-P 1/2: 4.36 cm2
S' Lateral: 3.45 cm

## 2020-09-17 LAB — SURGICAL PCR SCREEN
MRSA, PCR: NEGATIVE
Staphylococcus aureus: NEGATIVE

## 2020-09-17 SURGERY — PACEMAKER IMPLANT

## 2020-09-17 MED ORDER — IOHEXOL 350 MG/ML SOLN
INTRAVENOUS | Status: DC | PRN
  Administered 2020-09-17: 15 mL

## 2020-09-17 MED ORDER — CHLORHEXIDINE GLUCONATE 4 % EX LIQD
60.0000 mL | Freq: Once | CUTANEOUS | Status: AC
  Administered 2020-09-17: 4 via TOPICAL
  Filled 2020-09-17 (×2): qty 60

## 2020-09-17 MED ORDER — CEFAZOLIN SODIUM-DEXTROSE 1-4 GM/50ML-% IV SOLN
1.0000 g | Freq: Four times a day (QID) | INTRAVENOUS | Status: AC
  Administered 2020-09-17 – 2020-09-18 (×3): 1 g via INTRAVENOUS
  Filled 2020-09-17 (×3): qty 50

## 2020-09-17 MED ORDER — CEFAZOLIN SODIUM-DEXTROSE 2-4 GM/100ML-% IV SOLN
INTRAVENOUS | Status: AC
  Filled 2020-09-17: qty 100

## 2020-09-17 MED ORDER — SODIUM CHLORIDE 0.9 % IV SOLN
80.0000 mg | INTRAVENOUS | Status: AC
  Administered 2020-09-17: 80 mg
  Filled 2020-09-17: qty 2

## 2020-09-17 MED ORDER — SODIUM CHLORIDE 0.9 % IV SOLN: INTRAVENOUS | Status: DC

## 2020-09-17 MED ORDER — SODIUM CHLORIDE 0.9% FLUSH: 3.0000 mL | INTRAVENOUS | Status: DC | PRN

## 2020-09-17 MED ORDER — LIDOCAINE HCL 1 % IJ SOLN
INTRAMUSCULAR | Status: AC
  Filled 2020-09-17: qty 20

## 2020-09-17 MED ORDER — HEPARIN (PORCINE) IN NACL 1000-0.9 UT/500ML-% IV SOLN
INTRAVENOUS | Status: AC
  Filled 2020-09-17: qty 500

## 2020-09-17 MED ORDER — LISINOPRIL 10 MG PO TABS: 10.0000 mg | ORAL_TABLET | Freq: Every day | ORAL | Status: DC

## 2020-09-17 MED ORDER — SODIUM CHLORIDE 0.9 % IV SOLN
INTRAVENOUS | Status: AC
  Filled 2020-09-17: qty 2

## 2020-09-17 MED ORDER — PSYLLIUM 51.7 % PO PACK: PACK | ORAL | Status: DC

## 2020-09-17 MED ORDER — SODIUM CHLORIDE 0.9% FLUSH
3.0000 mL | Freq: Two times a day (BID) | INTRAVENOUS | Status: DC
  Administered 2020-09-17 – 2020-09-18 (×2): 3 mL via INTRAVENOUS

## 2020-09-17 MED ORDER — LIDOCAINE HCL 1 % IJ SOLN
INTRAMUSCULAR | Status: AC
  Filled 2020-09-17: qty 60

## 2020-09-17 MED ORDER — HYDROCODONE-ACETAMINOPHEN 5-325 MG PO TABS: 1.0000 | ORAL_TABLET | ORAL | Status: DC | PRN

## 2020-09-17 MED ORDER — FINASTERIDE 5 MG PO TABS
5.0000 mg | ORAL_TABLET | Freq: Every day | ORAL | Status: DC
  Administered 2020-09-17: 5 mg via ORAL
  Filled 2020-09-17: qty 1

## 2020-09-17 MED ORDER — ACETAMINOPHEN 325 MG PO TABS
325.0000 mg | ORAL_TABLET | ORAL | Status: DC | PRN
  Administered 2020-09-17: 650 mg via ORAL
  Filled 2020-09-17: qty 2

## 2020-09-17 MED ORDER — ROSUVASTATIN CALCIUM 5 MG PO TABS
5.0000 mg | ORAL_TABLET | Freq: Every day | ORAL | Status: DC
  Administered 2020-09-17: 5 mg via ORAL
  Filled 2020-09-17: qty 1

## 2020-09-17 MED ORDER — MUPIROCIN 2 % EX OINT
TOPICAL_OINTMENT | CUTANEOUS | Status: AC
  Administered 2020-09-17: 1
  Filled 2020-09-17: qty 22

## 2020-09-17 MED ORDER — PSYLLIUM 95 % PO PACK
1.0000 | PACK | Freq: Every day | ORAL | Status: DC
  Filled 2020-09-17 (×2): qty 1

## 2020-09-17 MED ORDER — HEPARIN (PORCINE) IN NACL 1000-0.9 UT/500ML-% IV SOLN
INTRAVENOUS | Status: DC | PRN
  Administered 2020-09-17: 500 mL

## 2020-09-17 MED ORDER — ONDANSETRON HCL 4 MG/2ML IJ SOLN: 4.0000 mg | Freq: Four times a day (QID) | INTRAMUSCULAR | Status: DC | PRN

## 2020-09-17 MED ORDER — SODIUM CHLORIDE 0.9 % IV SOLN: 250.0000 mL | INTRAVENOUS | Status: DC | PRN

## 2020-09-17 MED ORDER — LIDOCAINE HCL (PF) 1 % IJ SOLN
INTRAMUSCULAR | Status: DC | PRN
  Administered 2020-09-17: 60 mL

## 2020-09-17 MED ORDER — CEFAZOLIN SODIUM-DEXTROSE 2-4 GM/100ML-% IV SOLN
2.0000 g | INTRAVENOUS | Status: AC
  Administered 2020-09-17: 2 g via INTRAVENOUS

## 2020-09-17 SURGICAL SUPPLY — 7 items
CABLE SURGICAL S-101-97-12 (CABLE) ×3 IMPLANT
LEAD TENDRIL MRI 46CM LPA1200M (Lead) ×1 IMPLANT
LEAD TENDRIL MRI 58CM LPA1200M (Lead) ×1 IMPLANT
PACEMAKER ASSURITY DR-RF (Pacemaker) ×1 IMPLANT
PAD PRO RADIOLUCENT 2001M-C (PAD) ×2 IMPLANT
SHEATH 8FR PRELUDE SNAP 13 (SHEATH) ×2 IMPLANT
TRAY PACEMAKER INSERTION (PACKS) ×2 IMPLANT

## 2020-09-17 NOTE — Interval H&P Note (Signed)
History and Physical Interval Note:  09/17/2020 2:08 PM  Ricky Hood  has presented today for surgery, with the diagnosis of heart block.  The various methods of treatment have been discussed with the patient and family. After consideration of risks, benefits and other options for treatment, the patient has consented to  Procedure(s): PACEMAKER IMPLANT (N/A) as a surgical intervention.  The patient's history has been reviewed, patient examined, no change in status, stable for surgery.  I have reviewed the patient's chart and labs.  Questions were answered to the patient's satisfaction.     Hillis Range

## 2020-09-17 NOTE — ED Notes (Signed)
Obtained consent for procedure 

## 2020-09-17 NOTE — Consult Note (Addendum)
ELECTROPHYSIOLOGY CONSULT NOTE    Patient ID: Ricky Hood MRN: [id number], DOB/AGE: 1946-09-01 74 y.o.  Admit date: 09/16/2020 Date of Consult: 09/17/2020  Primary Physician: Marlyn Corporal, PA Primary Cardiologist: No primary care provider on file.  Electrophysiologist: New  Referring Provider: Dr. Rosemary Holms  Patient Profile: Ricky Hood is a 74 y.o. male with a history of HTN, HLD, prostate enlargement, h/o retinal vein occlusion who is being seen today for the evaluation of CHB at the request of Dr. Rosemary Holms.  HPI:  Ricky Hood is a 74 y.o. male with medical history as above. He is a retired Sport and exercise psychologist. At baseline, walks 3 miles and does 40 minutes spin sessions on indoor bike. He was at his USOH until Friday of last week.  He did spin class 10/13 without difficulties. On Friday, 10/15, he had a fall backwards, but did not remember how he fell. He took fell again 10/18 while walking to his car. He collapsed suddenly and suffered a laceration to his face. Fall was partly witnessed by his wife.   EKG on arrival to ED showed CHB with escape at 36 bpm. Labs unremarkable. CT head/maxillofacial/cervical with no acute abnormality or fractures.   Pt is feeling OK currently. Denies any symptoms at rest, but with lightheadedness with standing. He repeats story above. After the episode 10/15 he was "shaky and slightly confused" for a few minutes. They made a PCP appointment which they were getting into the car to go to on 10/18 when he had his second syncopal episode. He denies chest pain, palpitations, dyspnea, PND, orthopnea, nausea, vomiting, edema, weight gain, or early satiety before or after either event, or in the recent weeks.   Past Medical History:  Diagnosis Date  . Hypercholesteremia   . Hypertension      Surgical History: History reviewed. No pertinent surgical history.   (Not in a hospital admission)   Inpatient Medications:  . finasteride  5 mg Oral QHS  .  heparin  5,000 Units Subcutaneous Q8H  . [START ON 09/18/2020] lisinopril  10 mg Oral QHS  . psyllium  1 packet Oral Q supper  . rosuvastatin  5 mg Oral QHS  . sodium chloride flush  3 mL Intravenous Q12H    Allergies: No Known Allergies  Social History   Socioeconomic History  . Marital status: Married    Spouse name: Not on file  . Number of children: Not on file  . Years of education: Not on file  . Highest education level: Not on file  Occupational History  . Not on file  Tobacco Use  . Smoking status: Never Smoker  Substance and Sexual Activity  . Alcohol use: Never  . Drug use: Not on file  . Sexual activity: Not on file  Other Topics Concern  . Not on file  Social History Narrative  . Not on file   Social Determinants of Health   Financial Resource Strain:   . Difficulty of Paying Living Expenses: Not on file  Food Insecurity:   . Worried About Programme researcher, broadcasting/film/video in the Last Year: Not on file  . Ran Out of Food in the Last Year: Not on file  Transportation Needs:   . Lack of Transportation (Medical): Not on file  . Lack of Transportation (Non-Medical): Not on file  Physical Activity:   . Days of Exercise per Week: Not on file  . Minutes of Exercise per Session: Not on file  Stress:   . Feeling of  Stress : Not on file  Social Connections:   . Frequency of Communication with Friends and Family: Not on file  . Frequency of Social Gatherings with Friends and Family: Not on file  . Attends Religious Services: Not on file  . Active Member of Clubs or Organizations: Not on file  . Attends Banker Meetings: Not on file  . Marital Status: Not on file  Intimate Partner Violence:   . Fear of Current or Ex-Partner: Not on file  . Emotionally Abused: Not on file  . Physically Abused: Not on file  . Sexually Abused: Not on file     Family History  Problem Relation Age of Onset  . Hypertension Mother   . Hyperlipidemia Mother   . Other Mother         Carotid endarterectomy  . Hypertension Father   . Hyperlipidemia Father      Review of Systems: All other systems reviewed and are otherwise negative except as noted above.  Physical Exam: Vitals:   09/17/20 0530 09/17/20 0545 09/17/20 0600 09/17/20 0700  BP: 119/73 115/72 (!) 144/58 138/60  Pulse: (!) 39 (!) 29 (!) 32 83  Resp: 16 19 12 20   Temp:      TempSrc:      SpO2: 98% 98% 99% 99%    GEN- The patient is well appearing, alert and oriented x 3 today.   HEENT: normocephalic, atraumatic; sclera clear, conjunctiva pink; hearing intact; oropharynx clear; neck supple Lungs- Clear to ausculation bilaterally, normal work of breathing.  No wheezes, rales, rhonchi Heart- Regular rate and rhythm, no murmurs, rubs or gallops GI- soft, non-tender, non-distended, bowel sounds present Extremities- no clubbing, cyanosis, or edema; DP/PT/radial pulses 2+ bilaterally MS- no significant deformity or atrophy Skin- warm and dry, no rash or lesion Psych- euthymic mood, full affect Neuro- strength and sensation are intact  Labs:   Lab Results  Component Value Date   WBC 11.5 (H) 09/16/2020   HGB 13.3 09/16/2020   HCT 39.8 09/16/2020   MCV 92.8 09/16/2020   PLT 174 09/16/2020    Recent Labs  Lab 09/16/20 2200  NA 135  K 4.0  CL 102  CO2 24  BUN 17  CREATININE 1.00  CALCIUM 8.4*  PROT 5.8*  BILITOT 0.9  ALKPHOS 51  ALT 67*  AST 32  GLUCOSE 126*      Radiology/Studies: CT Head Wo Contrast  Result Date: 09/16/2020 CLINICAL DATA:  Fall EXAM: CT HEAD WITHOUT CONTRAST TECHNIQUE: Contiguous axial images were obtained from the base of the skull through the vertex without intravenous contrast. COMPARISON:  None. FINDINGS: Brain: There is atrophy and chronic small vessel disease changes. No acute intracranial abnormality. Specifically, no hemorrhage, hydrocephalus, mass lesion, acute infarction, or significant intracranial injury. Vascular: No hyperdense vessel or unexpected  calcification. Skull: No acute calvarial abnormality. Sinuses/Orbits: Air-fluid levels within the maxillary sinuses bilaterally. Other: None IMPRESSION: Atrophy, chronic microvascular disease. No acute intracranial abnormality. Bilateral acute maxillary sinusitis. Electronically Signed   By: 09/18/2020 M.D.   On: 09/16/2020 19:19   CT Cervical Spine Wo Contrast  Result Date: 09/16/2020 CLINICAL DATA:  Fall EXAM: CT CERVICAL SPINE WITHOUT CONTRAST TECHNIQUE: Multidetector CT imaging of the cervical spine was performed without intravenous contrast. Multiplanar CT image reconstructions were also generated. COMPARISON:  None. FINDINGS: Alignment: No subluxation. Skull base and vertebrae: No acute fracture. No primary bone lesion or focal pathologic process. Soft tissues and spinal canal: No prevertebral fluid or swelling. No  visible canal hematoma. 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 Upper chest: No acute findings Other: None IMPRESSION: Diffuse degenerative disc and facet disease. No acute bony abnormality. Electronically Signed   By: Charlett Nose M.D.   On: 09/16/2020 19:22   DG Chest Port 1 View  Result Date: 09/16/2020 CLINICAL DATA:  Syncope EXAM: PORTABLE CHEST 1 VIEW COMPARISON:  None. FINDINGS: Cardiomegaly. No confluent opacities or effusions. No acute bony abnormality. IMPRESSION: Cardiomegaly.  No active disease. Electronically Signed   By: Charlett Nose M.D.   On: 09/16/2020 22:32   CT Maxillofacial WO CM  Result Date: 09/16/2020 CLINICAL DATA:  Fall, facial trauma.  Laceration under right eye EXAM: CT MAXILLOFACIAL WITHOUT CONTRAST TECHNIQUE: Multidetector CT imaging of the maxillofacial structures was performed. Multiplanar CT image reconstructions were also generated. COMPARISON:  None. FINDINGS: Osseous: No fracture or mandibular dislocation. No destructive process. Orbits: Negative. No traumatic or inflammatory finding. Sinuses:  Air-fluid level seen within both maxillary sinuses. Mild mucosal thickening in the ethmoid air cells. Mastoid air cells clear. Soft tissues: Mild soft tissue swelling in the right cheek region. Limited intracranial: No acute findings IMPRESSION: No facial or orbital fracture. Bilateral acute maxillary sinusitis. Electronically Signed   By: Charlett Nose M.D.   On: 09/16/2020 19:21    EKG: on admission showed CHB with escape at 36 bpm. QRS 126 ms. No baseline EKG (personally reviewed)  TELEMETRY: appears to show CHB vs 3:1 AV block with rates in 20-50s (personally reviewed)  Assessment/Plan: 1.  Advanced AV block with syncope  Not on AV nodal blocking agents.  Echo with LVEF 65-70%. If EF down, consider ischemic evaluation. Otherwise, pt indicated for PPM.  K 4.4. Cr 1.09. COVID negative TSH WNL.  Explained risks, benefits, and alternatives to PPM implantation, including but not limited to bleeding, infection, pneumothorax, pericardial effusion, lead dislodgement, heart attack, stroke, or death.  Pt verbalized understanding and agrees to proceed if indicated.   2. HTN BP running 110-140s  Pt is NPO and will hydrate for potential PPM this afternoon.   For questions or updates, please contact [facility] Please consult [email or web address] for contact info under Cardiology/STEMI.  Signed, Graciella Freer, PA-C  09/17/2020 8:58 AM  I have seen, examined the patient, and reviewed the above assessment and plan.  Changes to above are made where necessary.  On exam, bradycardic.  Ill appearing. The patient has persistent complete heart block since Friday.  No reversible causes. I would therefore recommend pacemaker implantation at this time.  Risks, benefits, alternatives to pacemaker implantation were discussed in detail with the patient today. The patient understands that the risks include but are not limited to bleeding, infection, pneumothorax, perforation, tamponade, vascular damage,  renal failure, MI, stroke, death,  and lead dislodgement and wishes to proceed. We will therefore schedule the procedure at the next available time.   Hillis Range MD, [facility] 09/17/2020 2:07 PM     Co Sign: Hillis Range, MD 09/17/2020 2:07 PM

## 2020-09-17 NOTE — H&P (View-Only) (Signed)
ELECTROPHYSIOLOGY CONSULT NOTE    Patient ID: Ricky Hood MRN: [id number], DOB/AGE: 1946-09-01 74 y.o.  Admit date: 09/16/2020 Date of Consult: 09/17/2020  Primary Physician: Marlyn Corporal, PA Primary Cardiologist: No primary care provider on file.  Electrophysiologist: New  Referring Provider: Dr. Rosemary Holms  Patient Profile: Ricky Hood is a 74 y.o. male with a history of HTN, HLD, prostate enlargement, h/o retinal vein occlusion who is being seen today for the evaluation of CHB at the request of Dr. Rosemary Holms.  HPI:  Ricky Hood is a 74 y.o. male with medical history as above. He is a retired Sport and exercise psychologist. At baseline, walks 3 miles and does 40 minutes spin sessions on indoor bike. He was at his USOH until Friday of last week.  He did spin class 10/13 without difficulties. On Friday, 10/15, he had a fall backwards, but did not remember how he fell. He took fell again 10/18 while walking to his car. He collapsed suddenly and suffered a laceration to his face. Fall was partly witnessed by his wife.   EKG on arrival to ED showed CHB with escape at 36 bpm. Labs unremarkable. CT head/maxillofacial/cervical with no acute abnormality or fractures.   Pt is feeling OK currently. Denies any symptoms at rest, but with lightheadedness with standing. He repeats story above. After the episode 10/15 he was "shaky and slightly confused" for a few minutes. They made a PCP appointment which they were getting into the car to go to on 10/18 when he had his second syncopal episode. He denies chest pain, palpitations, dyspnea, PND, orthopnea, nausea, vomiting, edema, weight gain, or early satiety before or after either event, or in the recent weeks.   Past Medical History:  Diagnosis Date  . Hypercholesteremia   . Hypertension      Surgical History: History reviewed. No pertinent surgical history.   (Not in a hospital admission)   Inpatient Medications:  . finasteride  5 mg Oral QHS  .  heparin  5,000 Units Subcutaneous Q8H  . [START ON 09/18/2020] lisinopril  10 mg Oral QHS  . psyllium  1 packet Oral Q supper  . rosuvastatin  5 mg Oral QHS  . sodium chloride flush  3 mL Intravenous Q12H    Allergies: No Known Allergies  Social History   Socioeconomic History  . Marital status: Married    Spouse name: Not on file  . Number of children: Not on file  . Years of education: Not on file  . Highest education level: Not on file  Occupational History  . Not on file  Tobacco Use  . Smoking status: Never Smoker  Substance and Sexual Activity  . Alcohol use: Never  . Drug use: Not on file  . Sexual activity: Not on file  Other Topics Concern  . Not on file  Social History Narrative  . Not on file   Social Determinants of Health   Financial Resource Strain:   . Difficulty of Paying Living Expenses: Not on file  Food Insecurity:   . Worried About Programme researcher, broadcasting/film/video in the Last Year: Not on file  . Ran Out of Food in the Last Year: Not on file  Transportation Needs:   . Lack of Transportation (Medical): Not on file  . Lack of Transportation (Non-Medical): Not on file  Physical Activity:   . Days of Exercise per Week: Not on file  . Minutes of Exercise per Session: Not on file  Stress:   . Feeling of  Stress : Not on file  Social Connections:   . Frequency of Communication with Friends and Family: Not on file  . Frequency of Social Gatherings with Friends and Family: Not on file  . Attends Religious Services: Not on file  . Active Member of Clubs or Organizations: Not on file  . Attends Banker Meetings: Not on file  . Marital Status: Not on file  Intimate Partner Violence:   . Fear of Current or Ex-Partner: Not on file  . Emotionally Abused: Not on file  . Physically Abused: Not on file  . Sexually Abused: Not on file     Family History  Problem Relation Age of Onset  . Hypertension Mother   . Hyperlipidemia Mother   . Other Mother         Carotid endarterectomy  . Hypertension Father   . Hyperlipidemia Father      Review of Systems: All other systems reviewed and are otherwise negative except as noted above.  Physical Exam: Vitals:   09/17/20 0530 09/17/20 0545 09/17/20 0600 09/17/20 0700  BP: 119/73 115/72 (!) 144/58 138/60  Pulse: (!) 39 (!) 29 (!) 32 83  Resp: 16 19 12 20   Temp:      TempSrc:      SpO2: 98% 98% 99% 99%    GEN- The patient is well appearing, alert and oriented x 3 today.   HEENT: normocephalic, atraumatic; sclera clear, conjunctiva pink; hearing intact; oropharynx clear; neck supple Lungs- Clear to ausculation bilaterally, normal work of breathing.  No wheezes, rales, rhonchi Heart- Regular rate and rhythm, no murmurs, rubs or gallops GI- soft, non-tender, non-distended, bowel sounds present Extremities- no clubbing, cyanosis, or edema; DP/PT/radial pulses 2+ bilaterally MS- no significant deformity or atrophy Skin- warm and dry, no rash or lesion Psych- euthymic mood, full affect Neuro- strength and sensation are intact  Labs:   Lab Results  Component Value Date   WBC 11.5 (H) 09/16/2020   HGB 13.3 09/16/2020   HCT 39.8 09/16/2020   MCV 92.8 09/16/2020   PLT 174 09/16/2020    Recent Labs  Lab 09/16/20 2200  NA 135  K 4.0  CL 102  CO2 24  BUN 17  CREATININE 1.00  CALCIUM 8.4*  PROT 5.8*  BILITOT 0.9  ALKPHOS 51  ALT 67*  AST 32  GLUCOSE 126*      Radiology/Studies: CT Head Wo Contrast  Result Date: 09/16/2020 CLINICAL DATA:  Fall EXAM: CT HEAD WITHOUT CONTRAST TECHNIQUE: Contiguous axial images were obtained from the base of the skull through the vertex without intravenous contrast. COMPARISON:  None. FINDINGS: Brain: There is atrophy and chronic small vessel disease changes. No acute intracranial abnormality. Specifically, no hemorrhage, hydrocephalus, mass lesion, acute infarction, or significant intracranial injury. Vascular: No hyperdense vessel or unexpected  calcification. Skull: No acute calvarial abnormality. Sinuses/Orbits: Air-fluid levels within the maxillary sinuses bilaterally. Other: None IMPRESSION: Atrophy, chronic microvascular disease. No acute intracranial abnormality. Bilateral acute maxillary sinusitis. Electronically Signed   By: 09/18/2020 M.D.   On: 09/16/2020 19:19   CT Cervical Spine Wo Contrast  Result Date: 09/16/2020 CLINICAL DATA:  Fall EXAM: CT CERVICAL SPINE WITHOUT CONTRAST TECHNIQUE: Multidetector CT imaging of the cervical spine was performed without intravenous contrast. Multiplanar CT image reconstructions were also generated. COMPARISON:  None. FINDINGS: Alignment: No subluxation. Skull base and vertebrae: No acute fracture. No primary bone lesion or focal pathologic process. Soft tissues and spinal canal: No prevertebral fluid or swelling. No  visible canal hematoma. 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 Upper chest: No acute findings Other: None IMPRESSION: Diffuse degenerative disc and facet disease. No acute bony abnormality. Electronically Signed   By: Charlett Nose M.D.   On: 09/16/2020 19:22   DG Chest Port 1 View  Result Date: 09/16/2020 CLINICAL DATA:  Syncope EXAM: PORTABLE CHEST 1 VIEW COMPARISON:  None. FINDINGS: Cardiomegaly. No confluent opacities or effusions. No acute bony abnormality. IMPRESSION: Cardiomegaly.  No active disease. Electronically Signed   By: Charlett Nose M.D.   On: 09/16/2020 22:32   CT Maxillofacial WO CM  Result Date: 09/16/2020 CLINICAL DATA:  Fall, facial trauma.  Laceration under right eye EXAM: CT MAXILLOFACIAL WITHOUT CONTRAST TECHNIQUE: Multidetector CT imaging of the maxillofacial structures was performed. Multiplanar CT image reconstructions were also generated. COMPARISON:  None. FINDINGS: Osseous: No fracture or mandibular dislocation. No destructive process. Orbits: Negative. No traumatic or inflammatory finding. Sinuses:  Air-fluid level seen within both maxillary sinuses. Mild mucosal thickening in the ethmoid air cells. Mastoid air cells clear. Soft tissues: Mild soft tissue swelling in the right cheek region. Limited intracranial: No acute findings IMPRESSION: No facial or orbital fracture. Bilateral acute maxillary sinusitis. Electronically Signed   By: Charlett Nose M.D.   On: 09/16/2020 19:21    EKG: on admission showed CHB with escape at 36 bpm. QRS 126 ms. No baseline EKG (personally reviewed)  TELEMETRY: appears to show CHB vs 3:1 AV block with rates in 20-50s (personally reviewed)  Assessment/Plan: 1.  Advanced AV block with syncope  Not on AV nodal blocking agents.  Echo with LVEF 65-70%. If EF down, consider ischemic evaluation. Otherwise, pt indicated for PPM.  K 4.4. Cr 1.09. COVID negative TSH WNL.  Explained risks, benefits, and alternatives to PPM implantation, including but not limited to bleeding, infection, pneumothorax, pericardial effusion, lead dislodgement, heart attack, stroke, or death.  Pt verbalized understanding and agrees to proceed if indicated.   2. HTN BP running 110-140s  Pt is NPO and will hydrate for potential PPM this afternoon.   For questions or updates, please contact [facility] Please consult [email or web address] for contact info under Cardiology/STEMI.  Signed, Graciella Freer, PA-C  09/17/2020 8:58 AM  I have seen, examined the patient, and reviewed the above assessment and plan.  Changes to above are made where necessary.  On exam, bradycardic.  Ill appearing. The patient has persistent complete heart block since Friday.  No reversible causes. I would therefore recommend pacemaker implantation at this time.  Risks, benefits, alternatives to pacemaker implantation were discussed in detail with the patient today. The patient understands that the risks include but are not limited to bleeding, infection, pneumothorax, perforation, tamponade, vascular damage,  renal failure, MI, stroke, death,  and lead dislodgement and wishes to proceed. We will therefore schedule the procedure at the next available time.   Hillis Range MD, [facility] 09/17/2020 2:07 PM     Co Sign: Hillis Range, MD 09/17/2020 2:07 PM

## 2020-09-17 NOTE — Progress Notes (Signed)
Bladder scan performed as patient states he has not voided in several hours. Bladder scan showed . In and out catheterization performed per protocol, urine drained.

## 2020-09-17 NOTE — Progress Notes (Addendum)
   09/17/20 1300  Assess: MEWS Score  Temp 98.6 F (37 C)  BP (!) 124/57  Pulse Rate (!) 31  ECG Heart Rate (!) 31  Resp 17  SpO2 98 %  O2 Device Room Air  Assess: MEWS Score  MEWS Temp 0  MEWS Systolic 0  MEWS Pulse 2  MEWS RR 0  MEWS LOC 0  MEWS Score 2  MEWS Score Color Yellow  Assess: if the MEWS score is Yellow or Red  Were vital signs taken at a resting state? Yes  Focused Assessment No change from prior assessment  Early Detection of Sepsis Score *See Row Information* Low  MEWS guidelines implemented *See Row Information* Yes  Treat  Pain Scale 0-10  Pain Score 0  Patients Stated Pain Goal 0  Take Vital Signs  Increase Vital Sign Frequency  Yellow: Q 2hr X 2 then Q 4hr X 2, if remains yellow, continue Q 4hrs  Escalate  MEWS: Escalate Yellow: discuss with charge nurse/RN and consider discussing with provider and RRT  Notify: Charge Nurse/RN  Name of Charge Nurse/RN Notified Kristen  Date Charge Nurse/RN Notified 09/17/20  Time Charge Nurse/RN Notified 1300  Document  Patient Outcome Not stable and remains on department (awaiting PPM procedure this afternoon)  Progress note created (see row info) Yes   Update 0313 PM: Patient left the floor and went to procedural area during the q2 hour vital signs window, so RN will not be able to obtain q2 hour vital signs. Patient will be on post-procedure sequenced vital signs upon return to the department.

## 2020-09-17 NOTE — H&P (Addendum)
Ricky Hood is an 74 y.o. male.   Chief Complaint: Fall/syncope HPI:   74 y.o. Caucasian male  with hypertension, hyperlipidemia, prostate enlargement, h/o retinal vein occlusion, admitted with fall and syncope.  Patient is a retired Sport and exercise psychologist, stays active at baseline with 3 miles walk as well as 40-minute spin cycling sessions. He had absolutely no symptoms until last few days. He did a spin session last Wednesday without difficulty. On Friday, he had a fall backwards, does not recollect how he fell. That did not cause any major injury, he took another fall on 09/16/2020. He was walking to his car, when he suddenly fell flat on his face, and lacerated part of his periorbital area. Fortunately, he did not sustain any fractures. Again, he does not have any recollection of how he fell. Fall was partly witnessed by his wife.  Work-up in the ED showed sinus rhythm with third-degree AV block and ventricular escape rhythm and occasional PVCs. He has stayed hemodynamically stable.   At baseline, he is on lisinopril for hypertension. He is not on any AV nodal blocking agents. TSH is 2.2.  Past Medical History:  Diagnosis Date  . Hypercholesteremia   . Hypertension     History reviewed. No pertinent surgical history.   Family History  Problem Relation Age of Onset  . Hypertension Mother   . Hyperlipidemia Mother   . Other Mother        Carotid endarterectomy  . Hypertension Father   . Hyperlipidemia Father     Social History:  reports that he has never smoked. He does not have any smokeless tobacco history on file. He reports that he does not drink alcohol. No history on file for drug use.  Allergies: No Known Allergies  Review of Systems  Constitutional: Negative for decreased appetite, malaise/fatigue, weight gain and weight loss.  HENT: Negative for congestion.        Superficial facial injuries   Eyes: Negative for visual disturbance.  Cardiovascular: Positive for syncope.  Negative for chest pain, dyspnea on exertion, leg swelling and palpitations.  Respiratory: Negative for cough.   Endocrine: Negative for cold intolerance.  Hematologic/Lymphatic: Does not bruise/bleed easily.  Skin: Negative for itching and rash.  Musculoskeletal: Positive for falls. Negative for myalgias.  Gastrointestinal: Negative for abdominal pain, nausea and vomiting.  Genitourinary: Negative for dysuria.  Neurological: Negative for dizziness and weakness.  Psychiatric/Behavioral: The patient is not nervous/anxious.   All other systems reviewed and are negative.    Blood pressure 138/60, pulse 83, temperature 98.6 F (37 C), temperature source Oral, resp. rate 20, SpO2 99 %. There is no height or weight on file to calculate BMI.  Physical Exam Vitals and nursing note reviewed.  Constitutional:      General: He is not in acute distress.    Appearance: He is well-developed.  HENT:     Head: Normocephalic.     Comments: Superficial laceration right periorbital area Eyes:     Conjunctiva/sclera: Conjunctivae normal.     Pupils: Pupils are equal, round, and reactive to light.  Neck:     Vascular: No JVD.  Cardiovascular:     Rate and Rhythm: Regular rhythm. Bradycardia present.     Pulses: Normal pulses and intact distal pulses.     Heart sounds: No murmur heard.   Pulmonary:     Effort: Pulmonary effort is normal.     Breath sounds: Normal breath sounds. No wheezing or rales.  Abdominal:     General:  Bowel sounds are normal.     Palpations: Abdomen is soft.     Tenderness: There is no rebound.  Musculoskeletal:        General: No tenderness. Normal range of motion.     Left lower leg: No edema.  Lymphadenopathy:     Cervical: No cervical adenopathy.  Skin:    General: Skin is warm and dry.  Neurological:     Mental Status: He is alert and oriented to person, place, and time.     Cranial Nerves: No cranial nerve deficit.     Results for orders placed or  performed during the hospital encounter of 09/16/20 (from the past 48 hour(s))  Basic metabolic panel     Status: Abnormal   Collection Time: 09/16/20  5:44 PM  Result Value Ref Range   Sodium 136 135 - 145 mmol/L   Potassium 4.4 3.5 - 5.1 mmol/L   Chloride 101 98 - 111 mmol/L   CO2 25 22 - 32 mmol/L   Glucose, Bld 111 (H) 70 - 99 mg/dL    Comment: Glucose reference range applies only to samples taken after fasting for at least 8 hours.   BUN 20 8 - 23 mg/dL   Creatinine, Ser 5.85 0.61 - 1.24 mg/dL   Calcium 8.6 (L) 8.9 - 10.3 mg/dL   GFR, Estimated >27 >78 mL/min   Anion gap 10 5 - 15    Comment: Performed at [facility], [address] [place], [place] [address]  CBC     Status: None   Collection Time: 09/16/20  5:44 PM  Result Value Ref Range   WBC 9.0 4.0 - 10.5 K/uL   RBC 4.44 4.22 - 5.81 MIL/uL   Hemoglobin 13.8 13.0 - 17.0 g/dL   HCT 36.1 39 - 52 %   MCV 94.1 80.0 - 100.0 fL   MCH 31.1 26.0 - 34.0 pg   MCHC 33.0 30.0 - 36.0 g/dL   RDW 44.3 15.4 - 00.8 %   Platelets 193 150 - 400 K/uL   nRBC 0.0 0.0 - 0.2 %    Comment: Performed at [facility], [address] [place], [place] [address]  Respiratory Panel by RT PCR (Flu A&B, Covid) - Nasopharyngeal Swab     Status: None   Collection Time: 09/16/20  6:43 PM   Specimen: Nasopharyngeal Swab  Result Value Ref Range   SARS Coronavirus 2 by RT PCR NEGATIVE NEGATIVE    Comment: (NOTE) SARS-CoV-2 target nucleic acids are NOT DETECTED.  The SARS-CoV-2 RNA is generally detectable in upper respiratoy specimens during the acute phase of infection. The lowest concentration of SARS-CoV-2 viral copies this assay can detect is 131 copies/mL. A negative result does not preclude SARS-Cov-2 infection and should not be used as the sole basis for treatment or other patient management decisions. A negative result may occur with  improper specimen collection/handling, submission of specimen other than nasopharyngeal swab,  presence of viral mutation(s) within the areas targeted by this assay, and inadequate number of viral copies (<131 copies/mL). A negative result must be combined with clinical observations, patient history, and epidemiological information. The expected result is Negative.  Fact Sheet for Patients:  https://www.moore.com/  Fact Sheet for Healthcare Providers:  https://www.young.biz/  This test is no t yet approved or cleared by the [place] FDA and  has been authorized for detection and/or diagnosis of SARS-CoV-2 by FDA under an Emergency Use Authorization (EUA). This EUA will remain  in effect (meaning this test can be used) for the duration of the COVID-19 declaration under Section 564(b)(1) of the Act, 21 U.S.C. section 360bbb-3(b)(1), unless the authorization is terminated or revoked sooner.     Influenza A by PCR NEGATIVE NEGATIVE   Influenza B by PCR NEGATIVE NEGATIVE    Comment: (NOTE) The Xpert Xpress SARS-CoV-2/FLU/RSV assay is intended as an aid in  the diagnosis of influenza from Nasopharyngeal swab specimens and  should not be used as a sole basis for treatment. Nasal washings and  aspirates are unacceptable for Xpert Xpress SARS-CoV-2/FLU/RSV  testing.  Fact Sheet for Patients: https://www.moore.com/  Fact Sheet for Healthcare Providers: https://www.young.biz/  This test is not yet approved or cleared by the [place] FDA and  has been authorized for detection and/or diagnosis of SARS-CoV-2 by  FDA under an Emergency Use Authorization (EUA). This EUA will remain  in effect (meaning this test can be used) for the duration of the  Covid-19 declaration under Section 564(b)(1) of the Act, 21  U.S.C. section 360bbb-3(b)(1), unless the authorization is  terminated or revoked. Performed at [facility], [address] [place], [place] [address]   CBC     Status: Abnormal    Collection Time: 09/16/20 10:00 PM  Result Value Ref Range   WBC 11.5 (H) 4.0 - 10.5 K/uL   RBC 4.29 4.22 - 5.81 MIL/uL   Hemoglobin 13.3 13.0 - 17.0 g/dL   HCT 48.1 39 - 52 %   MCV 92.8 80.0 - 100.0 fL   MCH 31.0 26.0 - 34.0 pg   MCHC 33.4 30.0 - 36.0 g/dL   RDW 85.6 31.4 - 97.0 %   Platelets 174 150 - 400 K/uL   nRBC 0.0 0.0 - 0.2 %    Comment: Performed at [facility], [address] [place], [place] [address]  Comprehensive metabolic panel     Status: Abnormal   Collection Time: 09/16/20 10:00 PM  Result Value Ref Range   Sodium 135 135 - 145 mmol/L   Potassium 4.0 3.5 - 5.1 mmol/L   Chloride 102 98 - 111 mmol/L   CO2 24 22 - 32 mmol/L   Glucose, Bld 126 (H) 70 - 99 mg/dL    Comment: Glucose reference range applies only to samples taken after fasting for at least 8 hours.   BUN 17 8 - 23 mg/dL   Creatinine, Ser 5.88 0.61 - 1.24 mg/dL   Calcium 8.4 (L) 8.9 - 10.3 mg/dL   Total Protein 5.8 (L) 6.5 - 8.1 g/dL   Albumin 3.2 (L) 3.5 - 5.0 g/dL   AST 32 15 - 41 U/L   ALT 67 (H) 0 - 44 U/L   Alkaline Phosphatase 51 38 - 126 U/L   Total Bilirubin 0.9 0.3 - 1.2 mg/dL   GFR, Estimated >50 >27 mL/min   Anion gap 9 5 - 15    Comment: Performed at [facility], [address] [place], [place] [address]  TSH     Status: None   Collection Time: 09/16/20 10:00 PM  Result Value Ref Range   TSH 2.953 0.350 - 4.500 uIU/mL    Comment: Performed by a 3rd Generation assay with a functional sensitivity of <=0.01 uIU/mL. Performed at [facility], [address], [place], [place] [address]     Labs:   Lab Results  Component Value Date   WBC 11.5 (H) 09/16/2020   HGB 13.3 09/16/2020  HCT 39.8 09/16/2020   MCV 92.8 09/16/2020   PLT 174 09/16/2020    Recent Labs  Lab 09/16/20 2200  NA 135  K 4.0  CL 102  CO2 24  BUN 17  CREATININE 1.00  CALCIUM 8.4*  PROT 5.8*  BILITOT 0.9  ALKPHOS 51  ALT 67*  AST 32  GLUCOSE 126*     TSH Recent Labs     09/16/20 2200  TSH 2.953      Current Facility-Administered Medications:  .  0.9 %  sodium chloride infusion, , Intravenous, Continuous, Mancel Bale, MD, Stopped at 09/17/20 0203 .  0.9 %  sodium chloride infusion, 250 mL, Intravenous, PRN, Yates Decamp, MD .  acetaminophen (TYLENOL) tablet 650 mg, 650 mg, Oral, Q4H PRN, Yates Decamp, MD .  heparin injection 5,000 Units, 5,000 Units, Subcutaneous, Q8H, Yates Decamp, MD, 5,000 Units at 09/17/20 0612 .  hydrALAZINE (APRESOLINE) tablet 25 mg, 25 mg, Oral, Q8H PRN, Yates Decamp, MD .  ondansetron (ZOFRAN) injection 4 mg, 4 mg, Intravenous, Q6H PRN, Yates Decamp, MD .  sodium chloride flush (NS) 0.9 % injection 3 mL, 3 mL, Intravenous, Q12H, Yates Decamp, MD, 3 mL at 09/16/20 2242 .  sodium chloride flush (NS) 0.9 % injection 3 mL, 3 mL, Intravenous, PRN, Yates Decamp, MD  Current Outpatient Medications:  .  aspirin 81 MG EC tablet, Take 81 mg by mouth at bedtime. , Disp: , Rfl:  .  Cholecalciferol (VITAMIN D3) 50 MCG (2000 UT) TABS, Take 2,000 Units by mouth at bedtime., Disp: , Rfl:  .  finasteride (PROSCAR) 5 MG tablet, Take 5 mg by mouth at bedtime. , Disp: , Rfl:  .  fish oil-omega-3 fatty acids 1000 MG capsule, Take 3 g by mouth at bedtime. , Disp: , Rfl:  .  lisinopril (ZESTRIL) 10 MG tablet, Take 10 mg by mouth at bedtime. , Disp: , Rfl:  .  loratadine (CLARITIN) 10 MG tablet, Take 10 mg by mouth at bedtime. , Disp: , Rfl:  .  Multiple Vitamins-Minerals (CENTRUM SILVER 50+MEN) TABS, Take 1 tablet by mouth at bedtime., Disp: , Rfl:  .  Psyllium (METAMUCIL FIBER PO), Take by mouth See admin instructions. Mix 1 teaspoonful of powder into 6-8 ounces of juice and drink in the morning and before supper, Disp: , Rfl:  .  rosuvastatin (CRESTOR) 5 MG tablet, Take 5 mg by mouth at bedtime. , Disp: , Rfl:  .  TART CHERRY PO, Take 10 mLs by mouth in the morning and at bedtime., Disp: , Rfl:    Today's Vitals   09/17/20 0545 09/17/20 0600 09/17/20 0700  09/17/20 0753  BP: 115/72 (!) 144/58 138/60   Pulse: (!) 29 (!) 32 83   Resp: Temp:      TempSrc:      SpO2: 98% 99% 99%   PainSc:    0-No pain   There is no height or weight on file to calculate BMI.    CARDIAC STUDIES:  EKG 09/16/2020: Sinus rhythm with third degree AV block Ventricular escape rhythm  Echocardiogram pending:   Assessment/Plan  74 y.o. Caucasian male  with hypertension, hyperlipidemia, prostate enlargement, h/o retinal vein occlusion, admitted with fall and syncope, third degree AV block  Third degree AV block: Two syncopal episodes. Ventricular escape rhythm.No reversible cause.  Consulted EP for permanent pacemaker today. Echocardiogram pending.   Hypertension: Stable. Hold lisinopril for now.   H/o retinal vein occlusion: On aspirin at baseline.  Hold for now pending pacemaker placement.  Hyperlipidemia: Continue Crestor 5 mg.    Elder NegusManish J Shanetra Blumenstock, MD Pager: [phone] Office: [phone]

## 2020-09-18 ENCOUNTER — Inpatient Hospital Stay (HOSPITAL_COMMUNITY): Payer: Medicare Other

## 2020-09-18 ENCOUNTER — Encounter (HOSPITAL_COMMUNITY): Payer: Self-pay | Admitting: Internal Medicine

## 2020-09-18 LAB — BASIC METABOLIC PANEL
Anion gap: 7 (ref 5–15)
BUN: 11 mg/dL (ref 8–23)
CO2: 25 mmol/L (ref 22–32)
Calcium: 8.3 mg/dL — ABNORMAL LOW (ref 8.9–10.3)
Chloride: 100 mmol/L (ref 98–111)
Creatinine, Ser: 1.01 mg/dL (ref 0.61–1.24)
GFR, Estimated: >60 mL/min (ref >60)
Glucose, Bld: 105 mg/dL — ABNORMAL HIGH (ref 70–99)
Potassium: 4.3 mmol/L (ref 3.5–5.1)
Sodium: 132 mmol/L — ABNORMAL LOW (ref 135–145)

## 2020-09-18 MED ORDER — DILTIAZEM HCL ER COATED BEADS 180 MG PO CP24: 180.0000 mg | ORAL_CAPSULE | Freq: Every day | ORAL | 1 refills | Status: DC

## 2020-09-18 NOTE — Discharge Summary (Signed)
Physician Discharge Summary  Patient ID: Ricky Hood MRN: [id number] DOB/AGE: [id number] 74 y.o. Ricky Corporal, PA   Admit date: 09/16/2020 Discharge date: 09/18/2020  Primary Discharge Diagnosis Complete heart block Syncope due to heart block Primary hypertension   Significant Diagnostic Studies:  Pacemaker implantation Oct 12, 2020:   1. Successful implantation of a [employer]urity MRI conditional  dual-chamber pacemaker for symptomatic complete heart block    Echocardiogram 2020/10/12:   1. Left ventricular ejection fraction, by estimation, is 65 to 70%. The left ventricle has hyperdynamic function. The left ventricle has no regional wall motion abnormalities. Left ventricular diastolic function could not be evaluated.  2. Right ventricular systolic function is normal. The right ventricular size is normal. There is mildly elevated pulmonary artery systolic pressure. Estimated RVSP 39 mmHg.  3. Mild mitral regurgitation. Moderate to severe tricuspid regurgitation.   Radiology:  DG Chest 2 View Result Date: 09/18/2020 CLINICAL DATA:  Cardiac device EXAM: CHEST - 2 VIEW COMPARISON:  Two days ago FINDINGS: New dual-chamber pacer from the left with leads conforming to the right ventricle and right atrial appendage. No pneumothorax or mediastinal widening. Stable cardiomegaly. Large lung volumes with diaphragm flattening. Calcified pulmonary nodule at the right base. Artifact from EKG leads IMPRESSION: Dual-chamber pacer implant without complicating feature. Electronically Signed   By: Marnee Spring M.D.   On: 09/18/2020 05:59   CT Head Wo Contrast Result Date: 09/16/2020 CLINICAL DATA:  Fall EXAM: CT HEAD WITHOUT CONTRAST TECHNIQUE: Contiguous axial images were obtained from the base of the skull through the vertex without intravenous contrast. COMPARISON:  None. FINDINGS: Brain: There is atrophy and chronic small vessel disease changes. No acute intracranial abnormality.  Specifically, no hemorrhage, hydrocephalus, mass lesion, acute infarction, or significant intracranial injury. Vascular: No hyperdense vessel or unexpected calcification. Skull: No acute calvarial abnormality. Sinuses/Orbits: Air-fluid levels within the maxillary sinuses bilaterally. Other: None IMPRESSION: Atrophy, chronic microvascular disease. No acute intracranial abnormality. Bilateral acute maxillary sinusitis. Electronically Signed   By: Charlett Nose M.D.   On: 09/16/2020 19:19   CT Cervical Spine Wo Contrast Result Date: 09/16/2020 CLINICAL DATA:  Fall EXAM: CT CERVICAL SPINE WITHOUT CONTRAST TECHNIQUE: Multidetector CT imaging of the cervical spine was performed without intravenous contrast. Multiplanar CT image reconstructions were also generated. COMPARISON:  None. FINDINGS: Alignment: No subluxation. Skull base and vertebrae: No acute fracture. No primary bone lesion or focal pathologic process. Soft tissues and spinal canal: No prevertebral fluid or swelling. No visible canal hematoma. 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 Upper chest: No acute findings Other: None IMPRESSION: Diffuse degenerative disc and facet disease. No acute bony abnormality. Electronically Signed   By: Charlett Nose M.D.   On: 09/16/2020 19:22   Hospital Course: Ricky Hood is a 74 y.o. 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  His wife activated EMS and was brought to the emergency room where he was found to be in complete heart block but with stable hemodynamics.  He was admitted to the hospital for observation and placement of pacemaker.  The following morning he underwent successful pacemaker implantation by Dr. Hillis Range by implantation of dual-chamber MRI safe [employer] pacemaker.  This morning patient is feeling  well and is asymptomatic.  His wife is present and he would like to return home.  No apparent complications from the procedure.  Recommendations on discharge: I have added diltiazem for hypertension.  He is now atrially paced and occasionally ventricularly paced.  He should be safe to be discharged home and tolerated all the procedures well.  I will arrange for a outpatient office visit within a week.  All questions answered.  Patient has scheduled appointment for wound check and also follow-up with Dr. Johney Frame in 3 months for pacemaker evaluation.  There is no indication for aspirin hence have discontinued this.  Discharge Exam: Vitals with BMI 09/18/2020 09/17/2020 09/17/2020  Height - - -  Weight 169 lbs 5 oz - -  BMI 26.51 - -  Systolic 151 142 161  Diastolic 70 70 67  Pulse 60 65 60    Physical Exam Constitutional:      Appearance: Normal appearance.  Cardiovascular:     Rate and Rhythm: Normal rate and regular rhythm.     Pulses: Intact distal pulses.     Heart sounds: Normal heart sounds. No murmur heard.  No gallop.      Comments: No leg edema, no JVD. Pulmonary:     Effort: Pulmonary effort is normal.     Breath sounds: Normal breath sounds.     Comments: Pacemaker/ICD site noted  in the left infraclavicular fossa.   Abdominal:     General: Bowel sounds are normal.     Palpations: Abdomen is soft.  Musculoskeletal:        General: Normal range of motion.  Skin:    Findings: Bruising (face from fall) present.  Neurological:     General: No focal deficit present.     Mental Status: He is alert and oriented to person, place, and time.  Psychiatric:        Mood and Affect: Mood normal.    Labs:   Lab Results  Component Value Date   WBC 11.5 (H) 09/16/2020   HGB 13.3 09/16/2020   HCT 39.8 09/16/2020   MCV 92.8 09/16/2020   PLT 174 09/16/2020    Recent Labs  Lab 09/16/20 2200 09/16/20 2200 09/18/20 0317  NA 135   < > 132*  K 4.0   < > 4.3  CL 102   < > 100   CO2 24   < > 25  BUN 17   < > 11  CREATININE 1.00   < > 1.01  CALCIUM 8.4*   < > 8.3*  PROT 5.8*  --   --   BILITOT 0.9  --   --   ALKPHOS 51  --   --   ALT 67*  --   --   AST 32  --   --   GLUCOSE 126*   < > 105*   < > = values in this interval not displayed.   Lipid Panel  No results found for: CHOL, TRIG, HDL, CHOLHDL, VLDL, LDLCALC  BNP (last 3 results) No results for input(s): BNP in the last 8760 hours.  HEMOGLOBIN A1C No results found for: HGBA1C, MPG TSH Recent Labs    09/16/20 2200  TSH 2.953   FOLLOW UP PLANS AND APPOINTMENTS  Allergies as of 09/18/2020   No Known Allergies     Medication List    STOP taking these medications   aspirin 81 MG EC tablet     TAKE these medications   Centrum Silver 50+Men Tabs Take 1 tablet by mouth at bedtime.   diltiazem 180 MG 24 hr capsule Commonly known  as: Cardizem CD Take 1 capsule (180 mg total) by mouth daily.   finasteride 5 MG tablet Commonly known as: PROSCAR Take 5 mg by mouth at bedtime.   fish oil-omega-3 fatty acids 1000 MG capsule Take 3 g by mouth at bedtime.   lisinopril 10 MG tablet Commonly known as: ZESTRIL Take 10 mg by mouth at bedtime.   loratadine 10 MG tablet Commonly known as: CLARITIN Take 10 mg by mouth at bedtime.   METAMUCIL FIBER PO Take by mouth See admin instructions. Mix 1 teaspoonful of powder into 6-8 ounces of juice and drink in the morning and before supper   rosuvastatin 5 MG tablet Commonly known as: CRESTOR Take 5 mg by mouth at bedtime.   TART CHERRY PO Take 10 mLs by mouth in the morning and at bedtime.   Vitamin D3 50 MCG (2000 UT) Tabs Take 2,000 Units by mouth at bedtime.       Follow-up Information    [facility] Follow up on 10/01/2020.   Why: at 240 pm for post pacemaker wound check Contact information: [address] [place] [phone]       Hillis Range, MD Follow up on 12/20/2020.   Specialty: Cardiology Why: at 315 for 3 month pacemaker check.  Contact information: [address] [place] [address] [phone]        Rayford Halsted, PA-C Follow up on 09/25/2020.   Specialty: Cardiology Why: 9AM and bring all medication Contact information: [address] [place] [address] [phone]                Yates Decamp, MD, [facility] 09/18/2020, 6:19 PM Office: [phone]

## 2020-09-18 NOTE — Discharge Instructions (Signed)
After Your Pacemaker   . You have a St. Jude Pacemaker  ACTIVITY . Do not lift your arm above shoulder height for 1 week after your procedure. After 7 days, you may progress as below.     Tuesday September 24, 2020  Wednesday September 25, 2020 Thursday September 26, 2020 Friday September 27, 2020   . Do not lift, push, pull, or carry anything over 10 pounds with the affected arm until 6 weeks (Tuesday October 29, 2020 ) after your procedure.   . Do NOT DRIVE until you have been seen for your wound check, or as long as instructed by your healthcare provider.   . Ask your healthcare provider when you can go back to work   INCISION/Dressing . If you are on a blood thinner such as Coumadin, Xarelto, Eliquis, Plavix, or Pradaxa please confirm with your provider when this should be resumed.   . Monitor your Pacemaker site for redness, swelling, and drainage. Call the device clinic at [phone] if you experience these symptoms or fever/chills.  . If your incision is sealed with Steri-strips or staples, you may shower 10 days after your procedure or when told by your provider. Do not remove the steri-strips or let the shower hit directly on your site. You may wash around your site with soap and water.    Marland Kitchen Avoid lotions, ointments, or perfumes over your incision until it is well-healed.  . You may use a hot tub or a pool AFTER your wound check appointment if the incision is completely closed.  Marland Kitchen PAcemaker Alerts:  Some alerts are vibratory and others beep. These are NOT emergencies. Please call our office to let us know. If this occurs at night or on weekends, it can wait until the next business day. Send a remote transmission.  . If your device is capable of reading fluid status (for heart failure), you will be offered monthly monitoring to review this with you.   DEVICE MANAGEMENT . Remote monitoring is used to monitor your pacemaker from home. This monitoring is scheduled every 91 days by  our office. It allows [place] to keep an eye on the functioning of your device to ensure it is working properly. You will routinely see your Electrophysiologist annually (more often if necessary).   . You should receive your ID card for your new device in 4-8 weeks. Keep this card with you at all times once received. Consider wearing a medical alert bracelet or necklace.  . Your Pacemaker may be MRI compatible. This will be discussed at your next office visit/wound check.  You should avoid contact with strong electric or magnetic fields.    Do not use amateur (ham) radio equipment or electric (arc) welding torches. MP3 player headphones with magnets should not be used. Some devices are safe to use if held at least 12 inches (30 cm) from your Pacemaker. These include power tools, lawn mowers, and speakers. If you are unsure if something is safe to use, ask your health care provider.   When using your cell phone, hold it to the ear that is on the opposite side from the Pacemaker. Do not leave your cell phone in a pocket over the Pacemaker.   You may safely use electric blankets, heating pads, computers, and microwave ovens.  Call the office right away if:  You have chest pain.  You feel more short of breath than you have felt before.  You feel more light-headed than you have felt before.  Your incision starts to open up.  This information is not intended to replace advice given to you by your health care provider. Make sure you discuss any questions you have with your health care provider.   

## 2020-09-18 NOTE — Progress Notes (Addendum)
Electrophysiology Rounding Note  Patient Name: Ricky Hood Date of Encounter: 09/18/2020  Primary Cardiologist: Dr. Rosemary Holms Electrophysiologist: New to Dr. Johney Frame   Subjective   The patient is doing well today.  At this time, the patient denies chest pain, shortness of breath, or any new concerns.  CXR 09/18/20 with stable lead placement and no PTx.   Inpatient Medications    Scheduled Meds: . finasteride  5 mg Oral QHS  . lisinopril  10 mg Oral QHS  . psyllium  1 packet Oral Q supper  . rosuvastatin  5 mg Oral QHS  . sodium chloride flush  3 mL Intravenous Q12H   Continuous Infusions: . sodium chloride    .  ceFAZolin (ANCEF) IV 1 g (09/18/20 0317)   PRN Meds: sodium chloride, acetaminophen, hydrALAZINE, HYDROcodone-acetaminophen, ondansetron (ZOFRAN) IV, sodium chloride flush   Vital Signs    Vitals:   09/17/20 1652 09/17/20 1707 09/17/20 2131 09/18/20 0426  BP: (!) 153/94 (!) 155/67 (!) 142/70 (!) 151/70  Pulse: 64 60 65 60  Resp: (!) 28 16 (!) 22 15  Temp:   97.8 F (36.6 C) 98.5 F (36.9 C)  TempSrc:   Axillary Oral  SpO2: 97% 98% 94% 94%  Weight:    76.8 kg  Height:        Intake/Output Summary (Last 24 hours) at 09/18/2020 0734 Last data filed at 09/18/2020 0317 Gross per 24 hour  Intake 820.97 ml  Output 600 ml  Net 220.97 ml   Filed Weights   09/17/20 1025 09/18/20 0426  Weight: 76.7 kg 76.8 kg    Physical Exam    GEN- The patient is well appearing, alert and oriented x 3 today.   Head- normocephalic, atraumatic Eyes-  Sclera clear, conjunctiva pink Ears- hearing intact Oropharynx- clear Neck- supple Lungs- Clear to ausculation bilaterally, normal work of breathing Heart- Regular rate and rhythm, no murmurs, rubs or gallops. PPM site stable.  GI- soft, NT, ND, + BS Extremities- no clubbing or cyanosis. No edema Skin- no rash or lesion Psych- euthymic mood, full affect Neuro- strength and sensation are intact  Labs     CBC Recent Labs    09/16/20 1744 09/16/20 2200  WBC 9.0 11.5*  HGB 13.8 13.3  HCT 41.8 39.8  MCV 94.1 92.8  PLT 193 174   Basic Metabolic Panel Recent Labs    33/35/45 2200 09/18/20 0317  NA 135 132*  K 4.0 4.3  CL 102 100  CO2 24 25  GLUCOSE 126* 105*  BUN 17 11  CREATININE 1.00 1.01  CALCIUM 8.4* 8.3*   Liver Function Tests Recent Labs    09/16/20 2200  AST 32  ALT 67*  ALKPHOS 51  BILITOT 0.9  PROT 5.8*  ALBUMIN 3.2*   No results for input(s): LIPASE, AMYLASE in the last 72 hours. Cardiac Enzymes No results for input(s): CKTOTAL, CKMB, CKMBINDEX, TROPONINI in the last 72 hours.   Telemetry    V pacing and periods of A/V pacing in 60s (personally reviewed)  Radiology    DG Chest 2 View  Result Date: 09/18/2020 CLINICAL DATA:  Cardiac device EXAM: CHEST - 2 VIEW COMPARISON:  Two days ago FINDINGS: New dual-chamber pacer from the left with leads conforming to the right ventricle and right atrial appendage. No pneumothorax or mediastinal widening. Stable cardiomegaly. Large lung volumes with diaphragm flattening. Calcified pulmonary nodule at the right base. Artifact from EKG leads IMPRESSION: Dual-chamber pacer implant without complicating feature. Electronically Signed  By: Marnee Spring M.D.   On: 09/18/2020 05:59   CT Head Wo Contrast  Result Date: 09/16/2020 CLINICAL DATA:  Fall EXAM: CT HEAD WITHOUT CONTRAST TECHNIQUE: Contiguous axial images were obtained from the base of the skull through the vertex without intravenous contrast. COMPARISON:  None. FINDINGS: Brain: There is atrophy and chronic small vessel disease changes. No acute intracranial abnormality. Specifically, no hemorrhage, hydrocephalus, mass lesion, acute infarction, or significant intracranial injury. Vascular: No hyperdense vessel or unexpected calcification. Skull: No acute calvarial abnormality. Sinuses/Orbits: Air-fluid levels within the maxillary sinuses bilaterally. Other: None  IMPRESSION: Atrophy, chronic microvascular disease. No acute intracranial abnormality. Bilateral acute maxillary sinusitis. Electronically Signed   By: Charlett Nose M.D.   On: 09/16/2020 19:19   CT Cervical Spine Wo Contrast  Result Date: 09/16/2020 CLINICAL DATA:  Fall EXAM: CT CERVICAL SPINE WITHOUT CONTRAST TECHNIQUE: Multidetector CT imaging of the cervical spine was performed without intravenous contrast. Multiplanar CT image reconstructions were also generated. COMPARISON:  None. FINDINGS: Alignment: No subluxation. Skull base and vertebrae: No acute fracture. No primary bone lesion or focal pathologic process. Soft tissues and spinal canal: No prevertebral fluid or swelling. No visible canal hematoma. 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 Upper chest: No acute findings Other: None IMPRESSION: Diffuse degenerative disc and facet disease. No acute bony abnormality. Electronically Signed   By: Charlett Nose M.D.   On: 09/16/2020 19:22   EP PPM/ICD IMPLANT  Result Date: 09/17/2020 SURGEON:  Hillis Range, MD   PREPROCEDURE DIAGNOSIS:  Symptomatic complete heart block   POSTPROCEDURE DIAGNOSIS:  Symptomatic complete heart block    PROCEDURES:  1. Left upper extremity venography.  2. Pacemaker implantation.   INTRODUCTION:  Ricky Hood is a 74 y.o. male with a history of symptomatic complete heart block who presents today for pacemaker implantation.  The patient reports intermittent episodes of dizziness syncope over the past 3-4 days.  He is found to have complete heart block as the cause. No reversible causes have been identified.  The patient therefore presents today for pacemaker implantation.   DESCRIPTION OF PROCEDURE:  Informed written consent was obtained, and  the patient was brought to the electrophysiology lab in a fasting state.  The patient required no sedation for the procedure today.  The patients left chest was prepped and draped in the  usual sterile fashion by the EP lab staff. The skin overlying the left deltopectoral region was infiltrated with lidocaine for local analgesia.  A 4-cm incision was made over the left deltopectoral region.  A left subcutaneous pacemaker pocket was fashioned using a combination of sharp and blunt dissection. Electrocautery was required to assure hemostasis.  Left Upper Extremity Venography: A venogram of the left upper extremity was performed, which revealed a large left axillary vein, which emptied into a large left subclavian vein.  The left cephalic vein was small in size.  RA/RV Lead Placement: The left axillary vein was therefore cannulated. Through the left axillary vein, a St Jude Medical Tendril MRI model [id number]- 46 (serial number  [id number]) right atrial lead and a St Jude Medical Tendril MRI model [id number]  (serial number  [id number]) right ventricular lead were advanced with fluoroscopic visualization into the right atrial appendage and right ventricular apical septal positions respectively.  Initial atrial lead P- waves measured 3 mV with impedance of 489 ohms and a threshold of 1.3 V at 0.5 msec.  Right ventricular lead R-waves measured 5 mV with an impedance of  566 ohms and a threshold of 1 V at 0.5 msec.  Both leads were secured to the pectoralis fascia using #2-0 silk over the suture sleeves. Device Placement:  The leads were then connected to a [facility] Assurity MRI model [id number] (serial number  [id number]) pacemaker.  The pocket was irrigated with copious gentamicin solution.  The pacemaker was then placed into the pocket.  The pocket was then closed in 2 layers with 2.0 Vicryl suture for the subcutaneous and subcuticular layers.  Steri-  Strips and a sterile dressing were then applied. EBL<37ml.  There were no early apparent complications. Permanent Pacemaker Indication: Documented non-reversible symptomatic bradycardia due to second degree and/or third degree atrioventricular block.    CONCLUSIONS:  1. Successful implantation of a St Jude Medical Assurity MRI conditional  dual-chamber pacemaker for symptomatic complete heart block  2. No early apparent complications.       Hillis Range, MD 09/17/2020 4:18 PM   DG Chest Port 1 View  Result Date: 09/16/2020 CLINICAL DATA:  Syncope EXAM: PORTABLE CHEST 1 VIEW COMPARISON:  None. FINDINGS: Cardiomegaly. No confluent opacities or effusions. No acute bony abnormality. IMPRESSION: Cardiomegaly.  No active disease. Electronically Signed   By: Charlett Nose M.D.   On: 09/16/2020 22:32   ECHOCARDIOGRAM COMPLETE  Result Date: 09/17/2020    ECHOCARDIOGRAM REPORT   Patient Name:   Ricky Hood Date of Exam: 09/17/2020 Medical Rec #:  [id number]      Height: Accession #:    [id number]     Weight: Date of Birth:  [birth year]      BSA: Patient Age:    74 years       BP:           133/80 mmHg Patient Gender: M              HR:           35 bpm. Exam Location:  Inpatient Procedure: 2D Echo Indications:     Syncope 780.2 / R55  History:         Patient has no prior history of Echocardiogram examinations.                  Risk Factors:Hypertension and Dyslipidemia. Fall and syncope.  Sonographer:     Leta Jungling RDCS Referring Phys:  [id number] [facility] J PATWARDHAN Diagnosing Phys: Truett Mainland MD IMPRESSIONS  1. Left ventricular ejection fraction, by estimation, is 65 to 70%. The left ventricle has hyperdynamic function. The left ventricle has no regional wall motion abnormalities. Left ventricular diastolic function could not be evaluated.  2. Right ventricular systolic function is normal. The right ventricular size is normal. There is mildly elevated pulmonary artery systolic pressure. Estimated RVSP 39 mmHg.  3. Mild mitral regurgitation. Moderate to severe tricuspid regurgitation. FINDINGS  Left Ventricle: Left ventricular ejection fraction, by estimation, is 65 to 70%. The left ventricle has hyperdynamic function. The left ventricle has no regional  wall motion abnormalities. The left ventricular internal cavity size was normal in size. There is no left ventricular hypertrophy. Left ventricular diastolic function could not be evaluated. Right Ventricle: The right ventricular size is normal. No increase in right ventricular wall thickness. Right ventricular systolic function is normal. There is mildly elevated pulmonary artery systolic pressure. The tricuspid regurgitant velocity is 2.99  m/s, and with an assumed right atrial pressure of 3 mmHg, the estimated right ventricular systolic pressure is 38.8 mmHg. Left Atrium: Left atrial size was normal in size.  Right Atrium: Right atrial size was normal in size. Pericardium: There is no evidence of pericardial effusion. Mitral Valve: The mitral valve is normal in structure. Mild mitral valve regurgitation. No evidence of mitral valve stenosis. Tricuspid Valve: The tricuspid valve is grossly normal. Tricuspid valve regurgitation is moderate to severe. No evidence of tricuspid stenosis. Aortic Valve: The aortic valve is normal in structure. Aortic valve regurgitation is not visualized. No aortic stenosis is present. Pulmonic Valve: The pulmonic valve was normal in structure. Pulmonic valve regurgitation is not visualized. No evidence of pulmonic stenosis. Aorta: The aortic root is normal in size and structure. Venous: The inferior vena cava is normal in size with greater than 50% respiratory variability, suggesting right atrial pressure of 3 mmHg. IAS/Shunts: No atrial level shunt detected by color flow Doppler.  LEFT VENTRICLE PLAX 2D LVIDd:         4.50 cm LVIDs:         3.45 cm LV PW:         1.00 cm LV IVS:        0.90 cm LVOT diam:     1.80 cm LV SV:         33 LVOT Area:     2.54 cm  RIGHT VENTRICLE RV S prime:     15.20 cm/s TAPSE (M-mode): 2.1 cm LEFT ATRIUM           RIGHT ATRIUM LA diam:      3.85 cm RA Area:     21.60 cm LA Vol (A4C): 64.5 ml RA Volume:   68.00 ml  AORTIC VALVE LVOT Vmax:   84.20 cm/s  LVOT Vmean:  59.300 cm/s LVOT VTI:    0.130 m  AORTA Ao Root diam: 3.60 cm MITRAL VALVE               TRICUSPID VALVE MV Area (PHT): 4.36 cm    TR Peak grad:   35.8 mmHg MV Decel Time: 174 msec    TR Vmax:        299.00 cm/s MV E velocity: 83.60 cm/s MV A velocity: 89.10 cm/s  SHUNTS MV E/A ratio:  0.94        Systemic VTI:  0.13 m                            Systemic Diam: 1.80 cm Truett MainlandManish Patwardhan MD Electronically signed by Truett MainlandManish Patwardhan MD Signature Date/Time: 09/17/2020/9:38:01 AM    Final    CT Maxillofacial WO CM  Result Date: 09/16/2020 CLINICAL DATA:  Fall, facial trauma.  Laceration under right eye EXAM: CT MAXILLOFACIAL WITHOUT CONTRAST TECHNIQUE: Multidetector CT imaging of the maxillofacial structures was performed. Multiplanar CT image reconstructions were also generated. COMPARISON:  None. FINDINGS: Osseous: No fracture or mandibular dislocation. No destructive process. Orbits: Negative. No traumatic or inflammatory finding. Sinuses: Air-fluid level seen within both maxillary sinuses. Mild mucosal thickening in the ethmoid air cells. Mastoid air cells clear. Soft tissues: Mild soft tissue swelling in the right cheek region. Limited intracranial: No acute findings IMPRESSION: No facial or orbital fracture. Bilateral acute maxillary sinusitis. Electronically Signed   By: Charlett NoseKevin  Dover M.D.   On: 09/16/2020 19:21    Patient Profile     Ricky Hood is a 74 y.o. male with a history of HTN, HLD, prostate enlargement, h/o retinal vein occlusion who is being seen today for the evaluation of CHB at the request of Dr. Rosemary HolmsPatwardhan.  Assessment & Plan    1. Symptomatic complete heart block s/p ST Jude Dual chamber PPM 09/17/2020 Stable overnight.  CXR with stable lead placement and without ptx.  Rate sensor turned on with frequent A pacing overnight.  Pt has wound check in 2 weeks with 3 month follow up with Dr. Johney Frame in AVS.  Wound care and arm restrictions reviewed personally with  patient and placed in AVS as well.  2. HTN Per Dr. Rosemary Holms  For questions or updates, please contact [facility] Please consult [email or web address] for contact info under Cardiology/STEMI.  Signed, Graciella Freer, PA-C  09/18/2020, 7:34 AM      Device interrogation is personally reviewed and normal.  CXR reveals stable leads, no ptx.  Hillis Range MD, [facility]

## 2020-09-19 MED FILL — Lidocaine HCl Local Inj 1%: INTRAMUSCULAR | Qty: 60 | Status: AC

## 2020-09-19 MED FILL — Heparin Sod (Porcine)-NaCl IV Soln 1000 Unit/500ML-0.9%: INTRAVENOUS | Qty: 500 | Status: AC

## 2020-09-24 DIAGNOSIS — Z950 Presence of cardiac pacemaker: Secondary | ICD-10-CM | POA: Insufficient documentation

## 2020-09-24 HISTORY — DX: Presence of cardiac pacemaker: Z95.0

## 2020-09-24 NOTE — Progress Notes (Signed)
Primary Physician/Referring:  Marlyn Corporal, PA  Patient ID: Ricky Hood, male    DOB: 1946-10-26, 74 y.o.   MRN: [id number]  Chief Complaint  Patient presents with  . Heart block  . Hospitalization Follow-up  . New Patient (Initial Visit)   HPI:    Ricky Hood  is a 74 y.o. Caucasian male with history of hypertension, hyperlipidemia, prostate enlargement, retinal vein occlusion.  He was admitted to the hospital 09/16/2020 after he presented with fall and syncope and evaluation in the emergency department revealed EKG with sinus rhythm and third-degree AV block with ventricular escape rhythm and occasional PVCs.  Patient underwent successful implantation of Saint Jude dual-chamber pacemaker for symptomatic complete heart block on 09/17/2020.  He now presents for follow-up.  Since discharge patient has been doing well without recurrence of syncope.  Denies chest pain, shortness of breath, palpitations.  Reports the right side of his jaw is still bruised and sore as when he fell before going to the hospital he hit that side of his face.  States that he continues to slowly feel better.  He reports pacemaker implantation site is also mildly tender to touch, dressings have remained clean dry and intact.  Patient reports he is tolerating diltiazem 180 mg daily well since starting it upon discharge.  States that his home blood pressure readings are well controlled, however he does not bring a written log with him today.  Discharge summary via Dr. Jacinto Halim 09/18/2020: Hospital Course: Ricky Hood is a 74 y.o. 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  His wife activated EMS and was brought to the emergency room where he was found to be in complete heart block but with stable hemodynamics.  He was admitted to the hospital for observation and placement  of pacemaker.  The following morning he underwent successful pacemaker implantation by Dr. Hillis Range by implantation of dual-chamber MRI safe [facility] pacemaker.  This morning patient is feeling well and is asymptomatic.  His wife is present and he would like to return home.  No apparent complications from the procedure.  Recommendations on discharge: I have added diltiazem for hypertension.  He is now atrially paced and occasionally ventricularly paced.  He should be safe to be discharged home and tolerated all the procedures well.  I will arrange for a outpatient office visit within a week.  All questions answered.  Patient has scheduled appointment for wound check and also follow-up with Dr. Johney Frame in 3 months for pacemaker evaluation.  There is no indication for aspirin hence have discontinued this.  Past Medical History:  Diagnosis Date  . Hypercholesteremia   . Hypertension   . Presence of permanent cardiac pacemaker 09/17/2020   Past Surgical History:  Procedure Laterality Date  . BIOPSY PROSTATE    . COLONOSCOPY    . INSERT / REPLACE / REMOVE PACEMAKER  09/17/2020  . PACEMAKER IMPLANT N/A 09/17/2020   Procedure: PACEMAKER IMPLANT;  Surgeon: Hillis Range, MD;  Location: [facility]IVE [facility];  Service: Cardiovascular;  Laterality: N/A;   Family History  Problem Relation Age of Onset  . Hypertension Mother   . Hyperlipidemia Mother   . Other Mother        Carotid endarterectomy  . Hypertension Father   . Hyperlipidemia Father     Social History   Tobacco  Use  . Smoking status: Never Smoker  . Smokeless tobacco: Never Used  Substance Use Topics  . Alcohol use: Never   Marital Status: Married   ROS  Review of Systems  Constitutional: Negative for malaise/fatigue and weight gain.  Cardiovascular: Negative for chest pain, claudication, leg swelling, near-syncope, orthopnea, palpitations, paroxysmal nocturnal dyspnea and syncope.  Respiratory: Negative for shortness  of breath.   Hematologic/Lymphatic: Does not bruise/bleed easily.  Gastrointestinal: Negative for melena.  Neurological: Negative for dizziness and weakness.    Objective  Blood pressure (!) 144/78, pulse 70, resp. rate 16, height 5\' 7"  (1.702 m), weight 168 lb (76.2 kg), SpO2 98 %.  Vitals with BMI 09/25/2020 09/18/2020 09/17/2020  Height 5\' 7"  - -  Weight 168 lbs 169 lbs 5 oz -  BMI 26.31 26.51 -  Systolic 144 151 [id number]  Diastolic 78 70 70  Pulse 70 60 65     Physical Exam Vitals reviewed.  HENT:     Head: Normocephalic and atraumatic.     Comments: Tenderness and bruising along right jaw line  Cardiovascular:     Rate and Rhythm: Normal rate and regular rhythm.     Pulses: Intact distal pulses.          Carotid pulses are 2+ on the right side and 2+ on the left side.      Radial pulses are 2+ on the right side and 2+ on the left side.       Femoral pulses are 1+ on the right side and 1+ on the left side.      Popliteal pulses are 1+ on the right side and 1+ on the left side.       Dorsalis pedis pulses are 2+ on the right side and 2+ on the left side.       Posterior tibial pulses are 2+ on the right side and 2+ on the left side.     Heart sounds: S1 normal and S2 normal. Murmur heard.  Blowing systolic murmur is present at the apex.  No gallop.      Comments: No leg edema, no JVD Pulmonary:     Effort: Pulmonary effort is normal. No respiratory distress.     Breath sounds: No wheezing, rhonchi or rales.  Chest:     Comments: Pacemaker/ICD site noted  in the left infraclavicular fossa Musculoskeletal:     Right lower leg: No edema.     Left lower leg: No edema.  Skin:    Findings: Bruising (left chest and upper arm, surrounding pacemaker implantation site) present.  Neurological:     Mental Status: He is alert.     Laboratory examination:   Recent Labs    09/16/20 1744 09/16/20 2200 09/18/20 0317  NA 136 135 132*  K 4.4 4.0 4.3  CL 101 102 100  CO2 25 24  25   GLUCOSE 111* 126* 105*  BUN 20 17 11   CREATININE 1.09 1.00 1.01  CALCIUM 8.6* 8.4* 8.3*  GFRNONAA >60 >60 >60   CBC Latest Ref Rng & Units 09/16/2020 09/16/2020  WBC 4.0 - 10.5 K/uL 11.5(H) 9.0  Hemoglobin 13.0 - 17.0 g/dL 25.913.3 56.313.8  Hematocrit 39 - 52 % 39.8 41.8  Platelets 150 - 400 K/uL 174 193    Lipid Panel No results for input(s): CHOL, TRIG, LDLCALC, VLDL, HDL, CHOLHDL, LDLDIRECT in the last 8760 hours.  HEMOGLOBIN A1C No results found for: HGBA1C, MPG TSH Recent Labs    09/16/20 2200  TSH  2.953    External labs:   07/08/2020: HDL 57, LDL 59, total cholesterol 300, triglycerides 90 A1c 5.5%  Medications and allergies  No Known Allergies   Outpatient Medications Prior to Visit  Medication Sig Dispense Refill  . Cholecalciferol (VITAMIN D3) 50 MCG (2000 UT) TABS Take 2,000 Units by mouth at bedtime.    Marland Kitchen co-enzyme Q-10 50 MG capsule Take 50 mg by mouth daily.    Marland Kitchen diltiazem (CARDIZEM CD) 180 MG 24 hr capsule Take 1 capsule (180 mg total) by mouth daily. 30 capsule 1  . finasteride (PROSCAR) 5 MG tablet Take 5 mg by mouth at bedtime.     . fish oil-omega-3 fatty acids 1000 MG capsule Take 3 g by mouth at bedtime.     Marland Kitchen lisinopril (ZESTRIL) 10 MG tablet Take 10 mg by mouth at bedtime.     Marland Kitchen loratadine (CLARITIN) 10 MG tablet Take 10 mg by mouth at bedtime.     . Multiple Vitamins-Minerals (CENTRUM SILVER 50+MEN) TABS Take 1 tablet by mouth at bedtime.    . Psyllium (METAMUCIL FIBER PO) Take by mouth See admin instructions. Mix 1 teaspoonful of powder into 6-8 ounces of juice and drink in the morning and before supper    . rosuvastatin (CRESTOR) 5 MG tablet Take 5 mg by mouth at bedtime.     Marland Kitchen TART CHERRY PO Take 10 mLs by mouth in the morning and at bedtime.     No facility-administered medications prior to visit.     Radiology:   No results found. DG Chest 2 View Result Date: 09/18/2020 CLINICAL DATA:  Cardiac device EXAM: CHEST - 2 VIEW COMPARISON:  Two  days ago FINDINGS: New dual-chamber pacer from the left with leads conforming to the right ventricle and right atrial appendage. No pneumothorax or mediastinal widening. Stable cardiomegaly. Large lung volumes with diaphragm flattening. Calcified pulmonary nodule at the right base. Artifact from EKG leads IMPRESSION: Dual-chamber pacer implant without complicating feature. Electronically Signed   By: Marnee Spring M.D.   On: 09/18/2020 05:59   CT Head Wo Contrast Result Date: 09/16/2020 CLINICAL DATA:  Fall EXAM: CT HEAD WITHOUT CONTRAST TECHNIQUE: Contiguous axial images were obtained from the base of the skull through the vertex without intravenous contrast. COMPARISON:  None. FINDINGS: Brain: There is atrophy and chronic small vessel disease changes. No acute intracranial abnormality. Specifically, no hemorrhage, hydrocephalus, mass lesion, acute infarction, or significant intracranial injury. Vascular: No hyperdense vessel or unexpected calcification. Skull: No acute calvarial abnormality. Sinuses/Orbits: Air-fluid levels within the maxillary sinuses bilaterally. Other: None IMPRESSION: Atrophy, chronic microvascular disease. No acute intracranial abnormality. Bilateral acute maxillary sinusitis. Electronically Signed   By: Charlett Nose M.D.   On: 09/16/2020 19:19   CT Cervical Spine Wo Contrast Result Date: 09/16/2020 CLINICAL DATA:  Fall EXAM: CT CERVICAL SPINE WITHOUT CONTRAST TECHNIQUE: Multidetector CT imaging of the cervical spine was performed without intravenous contrast. Multiplanar CT image reconstructions were also generated. COMPARISON:  None. FINDINGS: Alignment: No subluxation. Skull base and vertebrae: No acute fracture. No primary bone lesion or focal pathologic process. Soft tissues and spinal canal: No prevertebral fluid or swelling. No visible canal hematoma. 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 Upper chest: No acute findings Other:  None IMPRESSION: Diffuse degenerative disc and facet disease. No acute bony abnormality. Electronically Signed   By: Charlett Nose M.D.   On: 09/16/2020 19:22   Cardiac Studies:   Pacemaker implantation 09/17/2020:  1. Successful implantation of a St Jude Medical Assurity MRI conditional  dual-chamber pacemaker for symptomatic complete heart block    Echocardiogram 09/17/2020:  1. Left ventricular ejection fraction, by estimation, is 65 to 70%. The left ventricle has hyperdynamic function. The left ventricle has no regional wall motion abnormalities. Left ventricular diastolic function could not be evaluated. 2. Right ventricular systolic function is normal. The right ventricular size is normal. There is mildly elevated pulmonary artery systolic pressure. Estimated RVSP 39 mmHg. 3. Mild mitral regurgitation. Moderate to severe tricuspid regurgitation.  EKG:   EKG 09/18/2020: AV dual paced rhythm at a rate of 62 bpm  EKG 09/16/2020: Sinus bradycardia at a rate of 36 bpm with third-degree AV block.  Right axis deviation.  Poor R wave progression, cannot exclude anterior septal infarct old.  IVCD.  Assessment     ICD-10-CM   1. AV block, 3rd degree (HCC)  I44.2 EKG 12-Lead  2. St Jude Medical Assurity MRI conditional  dual-chamber pacemaker for symptomatic complete heart block    Z95.0   3. Essential hypertension  I10   4. Mixed hyperlipidemia  E78.2      There are no discontinued medications.  No orders of the defined types were placed in this encounter.   Recommendations:   Ricky Hood is a 74 y.o. Caucasian male with history of hypertension, hyperlipidemia, prostate enlargement, retinal vein occlusion.  He was admitted to the hospital 09/16/2020 after he presented with fall and syncope and evaluation in the emergency department revealed EKG with sinus rhythm and third-degree AV block with ventricular escape rhythm and occasional PVCs.  Patient underwent successful implantation  of Saint Jude dual-chamber pacemaker for symptomatic complete heart block on 09/17/2020.  Patient presents for follow-up after pacemaker implantation 09/17/2020 for third-degree AV block.  Patient is presently doing well without symptoms or recurrence of syncope.  He has been tolerating diltiazem 180 mg well since initiation upon discharge for hypertension.  Blood pressure was elevated in the office today, upon recheck it did improve some but was still above goal.  Patient reports that his blood pressures are well controlled at home, however unfortunately he does not have a written log for reference today.  Discussed with patient the importance of good control of hypertension.  Patient will monitor blood pressure at home on a daily basis and keep a written log, I will call him to discuss these readings in 1 week and reevaluate need for medication adjustments or additions.  Patient expressed understanding and is agreeable with this plan.  I personally reviewed external labs, lipids are under excellent control.  Will continue rosuvastatin 5 mg daily, 3 g fish oil omega-3 fatty acids daily, and Metamucil.  Kidney function remains stable within normal limits.  However patient sodium level was still low at 132 on 09/18/2020. Will monitor this as needed.  As patient is still concerned about bruising and tenderness along his jawline, recommend he follow up with his PCP.  During this visit I reviewed and updated: Tobacco history  allergies medication reconciliation  medical history  surgical history  family history  social history.  This note was created using a voice recognition software as a result there may be grammatical errors inadvertently enclosed that do not reflect the nature of this encounter. Every attempt is made to correct such errors.   Rayford Halsted, PA-C 09/25/2020, 10:08 AM Office: [phone]

## 2020-09-25 ENCOUNTER — Other Ambulatory Visit: Payer: Self-pay

## 2020-09-25 ENCOUNTER — Encounter: Payer: Self-pay | Admitting: Student

## 2020-09-25 ENCOUNTER — Ambulatory Visit: Payer: Medicare Other | Admitting: Student

## 2020-09-25 VITALS — BP 144/78 | HR 70 | Resp 16 | Ht 67.0 in | Wt 168.0 lb

## 2020-09-25 DIAGNOSIS — Z950 Presence of cardiac pacemaker: Secondary | ICD-10-CM

## 2020-09-25 DIAGNOSIS — I10 Essential (primary) hypertension: Secondary | ICD-10-CM

## 2020-09-25 DIAGNOSIS — I442 Atrioventricular block, complete: Secondary | ICD-10-CM

## 2020-09-25 DIAGNOSIS — E782 Mixed hyperlipidemia: Secondary | ICD-10-CM

## 2020-09-27 ENCOUNTER — Telehealth: Payer: Self-pay | Admitting: Student

## 2020-09-27 NOTE — Telephone Encounter (Signed)
° ° °  Pt and his wife is coming for pt's wound check on 11/02. However, they're taking care of their grandchildren 15 and 74 years old. They said they need to bring them since no one will watch them. She also said she needs to be with the pt since pt is hard of hearing.

## 2020-09-27 NOTE — Telephone Encounter (Signed)
Returning patients phone.   Patient states they have it worked out where the children will stay with the other grandparents. Advised patient if he has any further questions to please call the device clinic.

## 2020-10-01 ENCOUNTER — Ambulatory Visit (INDEPENDENT_AMBULATORY_CARE_PROVIDER_SITE_OTHER): Payer: Medicare Other | Admitting: Emergency Medicine

## 2020-10-01 ENCOUNTER — Other Ambulatory Visit: Payer: Self-pay

## 2020-10-01 DIAGNOSIS — I442 Atrioventricular block, complete: Secondary | ICD-10-CM | POA: Diagnosis not present

## 2020-10-01 LAB — CUP PACEART INCLINIC DEVICE CHECK
Battery Remaining Longevity: 85 mo
Battery Voltage: 2.98 V
Brady Statistic RA Percent Paced: 60 %
Brady Statistic RV Percent Paced: 99.18 %
Date Time Interrogation Session: 20211102151103
Implantable Lead Implant Date: 20211019
Implantable Lead Implant Date: 20211019
Implantable Lead Location: 753859
Implantable Lead Location: 753860
Implantable Pulse Generator Implant Date: 20211019
Lead Channel Impedance Value: 437.5 Ohm
Lead Channel Impedance Value: 512.5 Ohm
Lead Channel Pacing Threshold Amplitude: 0.75 V
Lead Channel Pacing Threshold Amplitude: 0.75 V
Lead Channel Pacing Threshold Amplitude: 1 V
Lead Channel Pacing Threshold Pulse Width: 0.5 ms
Lead Channel Pacing Threshold Pulse Width: 0.5 ms
Lead Channel Pacing Threshold Pulse Width: 0.5 ms
Lead Channel Sensing Intrinsic Amplitude: 5 mV
Lead Channel Setting Pacing Amplitude: 1.25 V
Lead Channel Setting Pacing Amplitude: 3.5 V
Lead Channel Setting Pacing Pulse Width: 0.5 ms
Lead Channel Setting Sensing Sensitivity: 4 mV
Pulse Gen Model: 2272
Pulse Gen Serial Number: 6220383

## 2020-10-01 NOTE — Progress Notes (Signed)
Wound check appointment. Steri-strips removed. Wound without redness or edema. Incision edges approximated, 1 cm section on proximal end of incision presents with scab, pt educated to wash with soap and water/ monitor.  Normal device function. Thresholds, P wave sensing, and impedances consistent with implant measurements. Device programmed at 3.5V/auto capture programmed on for extra safety margin until 3 month visit. Histogram distribution appropriate for patient and level of activity. 1 AMS episode, appears AT lasting 10 seconds with peak A rate of 199 and V rate of 72, AMS burden <1%.  No high ventricular rates noted. Patient educated about wound care, arm mobility, lifting restrictions. Patient enrolled in remote monitoring, next scheduled check 12/18/20.  ROV with Dr. Johney Frame on 12/20/20.

## 2020-10-04 ENCOUNTER — Telehealth: Payer: Self-pay

## 2020-10-04 NOTE — Telephone Encounter (Signed)
Called pt asking for bp reading from a week ago, with an average of 60 for pulse.  10/23: 128/63, 129/61, 130/60  10/27: 140/63, 144/66, 139/65-right arm, 133/68, 133/65, 137/65-Left  10/28: 121/58, 124/60, 117/50  10/30: 114/57, 122/62, 122/62  10/31: 120/57, 113/54, 111/54  11/01: 125/62, 126/62, 125/60  11/02: 121/57, 126/59, 124/56  11/03: 119/63, 118/62, 120/60  11/05: 122/58, 117/58, 120/55

## 2020-10-04 NOTE — Telephone Encounter (Signed)
I reviewed patient's home blood pressure readings, which are well controlled.  Will not make changes to his antihypertensive medications at this time.  We will follow-up with patient in January as previously scheduled.

## 2020-10-04 NOTE — Telephone Encounter (Signed)
-----   Message from Rayford Halsted, [place] sent at 10/04/2020  4:20 PM EDT ----- Regarding: HTN Please call patient request home blood pressure readings for the last 1 week.  Please report these blood pressure readings to me.

## 2020-10-14 ENCOUNTER — Other Ambulatory Visit: Payer: Self-pay | Admitting: Student

## 2020-10-14 ENCOUNTER — Other Ambulatory Visit: Payer: Self-pay

## 2020-10-14 ENCOUNTER — Telehealth: Payer: Self-pay

## 2020-10-14 MED ORDER — DILTIAZEM HCL ER COATED BEADS 180 MG PO CP24: 180.0000 mg | ORAL_CAPSULE | Freq: Every day | ORAL | 0 refills | Status: DC

## 2020-10-14 MED ORDER — DILTIAZEM HCL ER COATED BEADS 180 MG PO CP24: 180.0000 mg | ORAL_CAPSULE | Freq: Every day | ORAL | 3 refills | Status: DC

## 2020-10-14 NOTE — Telephone Encounter (Signed)
Patient called requesting that order for diltiazem be sent to mail order pharmacy in 90 day supply. Will send prescription.

## 2020-11-06 ENCOUNTER — Telehealth: Payer: Self-pay

## 2020-11-06 NOTE — Telephone Encounter (Signed)
Patient returned phone call.  He reports his current symptoms started about 12/3.  He has felt very fatigued with episodes of lightheadedness.  BP has been low.  He attributed these symptoms to taking Diltiazem so he stopped taking Diltiazem with some improvement in BP and lightheadedness but fatigue remains.  Educated patient on AF and risks associated with it, including stroke risk.  Advised will need ongoing management with Cardiology.  Pt states he has personal connection to Dr. Tomie China in the [facility] office and ultimately would like him to be his Cardiologist.   Advised pt to continue to hold Diltiazem at this time.  Also reviewed ED precautions with pt, including new or worsening SOB or CP.    Scheduled pt for in-clinic appt with Dr. Johney Frame on 12/10 in [place].

## 2020-11-06 NOTE — Telephone Encounter (Signed)
Merlin alert- Alert for exceeded AT/AF burden; 6.6%; EGMs suggest A Fib; V rates controlled; ongoing; no history noted and no OAC. RV threshold noted to be elevated.    Chart review shows patient of [facility]. Telephone encounter today indicates patient is symptomatic.  Pt recently started Diltiazem 180mg  daily, although encounter indicates pt was advised to stop Diltiazem today.        Onset

## 2020-11-06 NOTE — Telephone Encounter (Signed)
Per Dr. Johney Frame request, reviewed report with Dr. Lalla Brothers in office.  He advised to contact patient, determine who he would liek to follow-up with whether it be [place] or [place] [facility].  Either way pt needs an inclinic appt to discuss new onset AF, OAC and ongoing management.    Attempted to reach pt, no answer, LVM with Device Clinic number and hours.

## 2020-11-06 NOTE — Telephone Encounter (Signed)
Patient called to report that he feels he has been having side effects secondary to diltiazem.  States his blood pressure has been in the 90s/50s and he has been having lightheadedness, fatigue, dizziness.  For the last 2 days patient has stopped taking diltiazem, and reports symptoms have significantly improved and blood pressure has been 115-120/60s.   I have advised patient to discontinue diltiazem at this time.  Patient will continue to monitor his blood pressure on a daily basis and notify the office if it is >130/90.  Patient will also notify the office if symptoms fail to improve or worsen.  He will be here for an office visit in 1 month, will reevaluate blood pressure control at that time.

## 2020-11-06 NOTE — Telephone Encounter (Signed)
Patient called stating that he thinks he may be having the side effects of the Diltiazem. Patient stated that when he takes it, he gets lightheaded, tired and feels like it worsens everyday. Patient is a requesting a phone call from you. Thank you.

## 2020-11-08 ENCOUNTER — Other Ambulatory Visit: Payer: Self-pay

## 2020-11-08 ENCOUNTER — Ambulatory Visit: Payer: Medicare Other | Admitting: Internal Medicine

## 2020-11-08 ENCOUNTER — Encounter: Payer: Self-pay | Admitting: Internal Medicine

## 2020-11-08 VITALS — BP 160/84 | HR 73 | Ht 67.0 in | Wt 182.0 lb

## 2020-11-08 DIAGNOSIS — I442 Atrioventricular block, complete: Secondary | ICD-10-CM

## 2020-11-08 DIAGNOSIS — D6869 Other thrombophilia: Secondary | ICD-10-CM

## 2020-11-08 DIAGNOSIS — I10 Essential (primary) hypertension: Secondary | ICD-10-CM

## 2020-11-08 DIAGNOSIS — I4819 Other persistent atrial fibrillation: Secondary | ICD-10-CM | POA: Diagnosis not present

## 2020-11-08 LAB — CUP PACEART INCLINIC DEVICE CHECK
Battery Remaining Longevity: 56 mo
Battery Remaining Longevity: 56 mo
Battery Voltage: 3.01 V
Battery Voltage: 3.01 V
Brady Statistic RA Percent Paced: 49 %
Brady Statistic RA Percent Paced: 49 %
Brady Statistic RV Percent Paced: 99.75 %
Brady Statistic RV Percent Paced: 99.75 %
Date Time Interrogation Session: 20211210125300
Date Time Interrogation Session: 20211210125300
Implantable Lead Implant Date: 20211019
Implantable Lead Implant Date: 20211019
Implantable Lead Implant Date: 20211019
Implantable Lead Implant Date: 20211019
Implantable Lead Location: 753859
Implantable Lead Location: 753860
Implantable Lead Location: 753859
Implantable Lead Location: 753860
Implantable Pulse Generator Implant Date: 20211019
Implantable Pulse Generator Implant Date: 20211019
Lead Channel Impedance Value: 425 Ohm
Lead Channel Impedance Value: 487.5 Ohm
Lead Channel Impedance Value: 425 Ohm
Lead Channel Impedance Value: 487.5 Ohm
Lead Channel Pacing Threshold Amplitude: 1 V
Lead Channel Pacing Threshold Amplitude: 1 V
Lead Channel Pacing Threshold Pulse Width: 0.5 ms
Lead Channel Pacing Threshold Pulse Width: 0.5 ms
Lead Channel Sensing Intrinsic Amplitude: 1.7 mV
Lead Channel Sensing Intrinsic Amplitude: 1.7 mV
Lead Channel Setting Pacing Amplitude: 3.5 V
Lead Channel Setting Pacing Amplitude: 3.5 V
Lead Channel Setting Pacing Amplitude: 3.5 V
Lead Channel Setting Pacing Amplitude: 3.5 V
Lead Channel Setting Pacing Pulse Width: 0.5 ms
Lead Channel Setting Pacing Pulse Width: 0.5 ms
Lead Channel Setting Sensing Sensitivity: 4 mV
Lead Channel Setting Sensing Sensitivity: 4 mV
Pulse Gen Model: 2272
Pulse Gen Model: 2272
Pulse Gen Serial Number: 6220383
Pulse Gen Serial Number: 6220383

## 2020-11-08 MED ORDER — FLECAINIDE ACETATE 100 MG PO TABS: 100.0000 mg | ORAL_TABLET | Freq: Two times a day (BID) | ORAL | 3 refills | Status: DC

## 2020-11-08 MED ORDER — RIVAROXABAN 20 MG PO TABS: 20.0000 mg | ORAL_TABLET | Freq: Every day | ORAL | 3 refills | Status: DC

## 2020-11-08 NOTE — Progress Notes (Signed)
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  Since having his PPM implanted, the patient reports doing very well.   No further syncope.  Denies procedure related complications.  Recently found to have afib.  He reports symptoms of fatigue and decreased exercise tolerance.  This appears to correlate to AF on his ppm interrogation.  Today, he denies symptoms of palpitations, chest pain, shortness of breath,  lower extremity edema, dizziness, presyncope, or syncope.  The patient is otherwise without complaint today.   Past Medical History:  Diagnosis Date  . Hypercholesteremia   . Hypertension   . Paroxysmal atrial fibrillation (HCC)   . 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 Second degree AV block    Past Surgical History:  Procedure Laterality Date  . BIOPSY PROSTATE    . COLONOSCOPY    . PACEMAKER IMPLANT N/A 09/17/2020   SJM Assurity DR implanted by Dr Johney Frame for AV block and syncope    ROS- all systems are reviewed and negative except as per HPI above  Current Outpatient Medications  Medication Sig Dispense Refill  . Cholecalciferol (VITAMIN D3) 50 MCG (2000 UT) TABS Take 2,000 Units by mouth at bedtime.    Marland Kitchen co-enzyme Q-10 50 MG capsule Take 50 mg by mouth daily.    . finasteride (PROSCAR) 5 MG tablet Take 5 mg by mouth at bedtime.     . fish oil-omega-3 fatty acids 1000 MG capsule Take 3 g by mouth at bedtime.     Marland Kitchen lisinopril (ZESTRIL) 10 MG tablet Take 10 mg by mouth at bedtime.     Marland Kitchen loratadine (CLARITIN) 10 MG tablet Take 10 mg by mouth at bedtime.     . Multiple Vitamins-Minerals (CENTRUM SILVER 50+MEN) TABS Take 1 tablet by mouth at bedtime.    . Psyllium (METAMUCIL FIBER PO) Take by mouth See admin instructions. Mix 1 teaspoonful of powder into 6-8 ounces of juice and drink in the morning and before supper    . rosuvastatin (CRESTOR) 5 MG tablet Take 5 mg  by mouth at bedtime.     Marland Kitchen TART CHERRY PO Take 10 mLs by mouth in the morning and at bedtime.     No current facility-administered medications for this visit.    Physical Exam: Vitals:   11/08/20 1157  BP: (!) 160/84  Pulse: 73  SpO2: 94%  Weight: 182 lb (82.6 kg)  Height: 5\' 7"  (1.702 m)    GEN- The patient is well appearing, alert and oriented x 3 today.   Head- normocephalic, atraumatic Eyes-  Sclera clear, conjunctiva pink Ears- hearing intact Oropharynx- clear Lungs-  normal work of breathing Chest- pacemaker pocket is well healed Heart- irregular rate and rhythm  GI- soft, NT, ND, + BS Extremities- no clubbing, cyanosis, or edema  Pacemaker interrogation- reviewed in detail today,  See PACEART report  ekg tracing ordered today is personally reviewed and shows afib with V pacing  Assessment and Plan:  1. Symptomatic complete heart block Normal pacemaker function See Art report he is device dependant today  2. New onset afib chads2vasc score is at least 2 and will be increased once he is 75 in March. We discussed at length today risks and benefits to [facility] therapy.  We will start on xarelto 20mg  daily Start flecainide 100mg  BID for rhythm control  3. HTN Stable No change required today  Risks, benefits and potential toxicities for medications prescribed and/or refilled reviewed with patient today.   Return to see me in 4 weeks   Hillis Range MD, [facility] 11/08/2020 12:09 PM

## 2020-11-08 NOTE — Patient Instructions (Addendum)
Medication Instructions:  1) Start Xarelto 20 mg daily with supper  2) Flecainide 100 mg two times daily.   *If you need a refill on your cardiac medications before your next appointment, please call your pharmacy*  Lab Work: None ordered.  If you have labs (blood work) drawn today and your tests are completely normal, you will receive your results only by: Marland Kitchen MyChart Message (if you have MyChart) OR . A paper copy in the mail If you have any lab test that is abnormal or we need to change your treatment, we will call you to review the results.  Testing/Procedures: None ordered.  Follow-Up: At [facility], you and your health needs are our priority.  As part of our continuing mission to provide you with exceptional heart care, we have created designated Provider Care Teams.  These Care Teams include your primary Cardiologist (physician) and Advanced Practice Providers (APPs -  Physician Assistants and Nurse Practitioners) who all work together to provide you with the care you need, when you need it.  We recommend signing up for the patient portal called "MyChart".  Sign up information is provided on this After Visit Summary.  MyChart is used to connect with patients for Virtual Visits (Telemedicine).  Patients are able to view lab/test results, encounter notes, upcoming appointments, etc.  Non-urgent messages can be sent to your provider as well.   To learn more about what you can do with MyChart, go to [email or web address].    Your next appointment:   Your physician wants you to follow-up in:  Scheduled for 12/20/20 at 3:15 pm with Dr. Johney Frame.    Other Instructions:

## 2020-11-12 ENCOUNTER — Telehealth: Payer: Self-pay

## 2020-11-12 NOTE — Telephone Encounter (Signed)
Manual transmission reviewed  Pt saw Dr. Johney Frame on 12/10 and as started on Flecainide, since he started flecainide he has only had 1 AF episodes lasting about on hour on 12/12.  AF burden is decreasing from 9% on 12/10 down to 5.6% today.  Explained to pt that this is a sign that the medication is working.  Pt concerned that "very now and then" he gets out of breath when walking.  This is not a new symptoms.  He has had it since prior to PM implant and it does not occur at rest or everytime he walks, just sometimes.  Confirmed with pt that he should keep taking meds as ordered and monitor, notify us if any new symptoms occur or frequency of symptoms increases.

## 2020-11-12 NOTE — Telephone Encounter (Signed)
The pt called states since he started the Xarelto and Flecainide, his breathing has been more rapid. He states he did good over the weekend but it started Sunday with the heavy breathing. I had him send in a manual transmission with his home remote monitor for the nurse to review. I let him speak with Amy, rn.

## 2020-11-14 ENCOUNTER — Telehealth: Payer: Self-pay | Admitting: Internal Medicine

## 2020-11-14 NOTE — Telephone Encounter (Signed)
C/O SOB, wheezing, coughing and swelling.. O2 stat is 93 currently but has went into the 80's. Can't lay flat even with cpap on at night. Weight is 191 lbs at home and in office visit 12/10 weight was 182 lbs This started after he started his new flecainide and Xarelto. His wife states he seems to be getting worse every day. RN can hear patient wheezing when he answered the phone.  Arliss Journey I would reach out to Dr. Johney Frame for advisement.  Wife verbalized understanding.   Dr. Johney Frame wants to bring him in to the first available at the [address] office or Afib clinic.  Set up appointment at the Afib clinic 12/17 at 8:30 am.  Gave ED precautions Wife in agreement and verbalized understanding.

## 2020-11-14 NOTE — Telephone Encounter (Signed)
Yes, dyspnea in 10% of patients and swelling in 4% of patients

## 2020-11-14 NOTE — Telephone Encounter (Signed)
Pt c/o Shortness Of Breath: STAT if SOB developed within the last 24 hours or pt is noticeably SOB on the phone  1. Are you currently SOB (can you hear that pt is SOB on the phone)?  Not sure. Pt is basically asleep now  2. How long have you been experiencing SOB? Has been a little SOB for a while, but it has been worse     since starting the new medicine 11/08/20.   3. Are you SOB when sitting or when up moving around? All the time  4. Are you currently experiencing any other symptoms? Wheezing and coughing   Pt c/o swelling: STAT is pt has developed SOB within 24 hours  1) How much weight have you gained and in what time span?   2) If swelling, where is the swelling located? Face and ankles  3) Are you currently taking a fluid pill? no  4) Are you currently SOB? no  5) Do you have a log of your daily weights (if so, list)? no  6) Have you gained 3 pounds in a day or 5 pounds in a week? no  7) Have you traveled recently? no   Wife of the patient called. The wife is concerned because the patient gets SOB waling anywhere. He can walk 10 feet from room to room and gets extremely winded. HE lays in bed at night and will be wheezing as well. The wife worries that he will stop breathing so she has not slept well trying to listen to him breathe.  The patient's kids were over last night and noticed the patient seemed like he was more swollen in his face and they were also concerned about how SOB the patient was. The wife is not sure what to do.

## 2020-11-15 ENCOUNTER — Other Ambulatory Visit: Payer: Self-pay

## 2020-11-15 ENCOUNTER — Inpatient Hospital Stay (HOSPITAL_COMMUNITY)
Admission: EM | Admit: 2020-11-15 | Discharge: 2020-11-24 | DRG: 291 | Disposition: A | Discharge status: Home / Self Care | Payer: Medicare Other | Attending: Cardiology | Admitting: Cardiology

## 2020-11-15 ENCOUNTER — Ambulatory Visit (HOSPITAL_BASED_OUTPATIENT_CLINIC_OR_DEPARTMENT_OTHER)
Admission: RE | Admit: 2020-11-15 | Discharge: 2020-11-15 | Disposition: A | Discharge status: Home / Self Care | Payer: Medicare Other | Source: Ambulatory Visit | Attending: Nurse Practitioner | Admitting: Nurse Practitioner

## 2020-11-15 ENCOUNTER — Encounter (HOSPITAL_COMMUNITY): Payer: Self-pay | Admitting: Pharmacy Technician

## 2020-11-15 ENCOUNTER — Emergency Department (HOSPITAL_COMMUNITY): Payer: Medicare Other

## 2020-11-15 VITALS — BP 196/102 | HR 74 | Ht 67.0 in | Wt 189.4 lb

## 2020-11-15 DIAGNOSIS — I5021 Acute systolic (congestive) heart failure: Secondary | ICD-10-CM | POA: Diagnosis not present

## 2020-11-15 DIAGNOSIS — I480 Paroxysmal atrial fibrillation: Secondary | ICD-10-CM

## 2020-11-15 DIAGNOSIS — Z950 Presence of cardiac pacemaker: Secondary | ICD-10-CM | POA: Diagnosis present

## 2020-11-15 DIAGNOSIS — J90 Pleural effusion, not elsewhere classified: Secondary | ICD-10-CM

## 2020-11-15 DIAGNOSIS — E8809 Other disorders of plasma-protein metabolism, not elsewhere classified: Secondary | ICD-10-CM | POA: Diagnosis present

## 2020-11-15 DIAGNOSIS — I110 Hypertensive heart disease with heart failure: Principal | ICD-10-CM | POA: Diagnosis present

## 2020-11-15 DIAGNOSIS — I4819 Other persistent atrial fibrillation: Secondary | ICD-10-CM | POA: Diagnosis not present

## 2020-11-15 DIAGNOSIS — D6869 Other thrombophilia: Secondary | ICD-10-CM | POA: Diagnosis not present

## 2020-11-15 DIAGNOSIS — I5031 Acute diastolic (congestive) heart failure: Secondary | ICD-10-CM | POA: Diagnosis present

## 2020-11-15 DIAGNOSIS — Z8261 Family history of arthritis: Secondary | ICD-10-CM

## 2020-11-15 DIAGNOSIS — I77810 Thoracic aortic ectasia: Secondary | ICD-10-CM

## 2020-11-15 DIAGNOSIS — E7800 Pure hypercholesterolemia, unspecified: Secondary | ICD-10-CM | POA: Diagnosis present

## 2020-11-15 DIAGNOSIS — R609 Edema, unspecified: Secondary | ICD-10-CM | POA: Diagnosis not present

## 2020-11-15 DIAGNOSIS — E785 Hyperlipidemia, unspecified: Secondary | ICD-10-CM | POA: Diagnosis present

## 2020-11-15 DIAGNOSIS — I10 Essential (primary) hypertension: Secondary | ICD-10-CM

## 2020-11-15 DIAGNOSIS — T501X5A Adverse effect of loop [high-ceiling] diuretics, initial encounter: Secondary | ICD-10-CM | POA: Diagnosis present

## 2020-11-15 DIAGNOSIS — I7121 Aneurysm of the ascending aorta, without rupture: Secondary | ICD-10-CM

## 2020-11-15 DIAGNOSIS — Y92239 Unspecified place in hospital as the place of occurrence of the external cause: Secondary | ICD-10-CM | POA: Diagnosis present

## 2020-11-15 DIAGNOSIS — I442 Atrioventricular block, complete: Secondary | ICD-10-CM | POA: Diagnosis present

## 2020-11-15 DIAGNOSIS — I313 Pericardial effusion (noninflammatory): Secondary | ICD-10-CM | POA: Diagnosis present

## 2020-11-15 DIAGNOSIS — Z79899 Other long term (current) drug therapy: Secondary | ICD-10-CM | POA: Insufficient documentation

## 2020-11-15 DIAGNOSIS — E871 Hypo-osmolality and hyponatremia: Secondary | ICD-10-CM | POA: Diagnosis present

## 2020-11-15 DIAGNOSIS — R0600 Dyspnea, unspecified: Secondary | ICD-10-CM

## 2020-11-15 DIAGNOSIS — Z20822 Contact with and (suspected) exposure to covid-19: Secondary | ICD-10-CM | POA: Diagnosis present

## 2020-11-15 DIAGNOSIS — I712 Thoracic aortic aneurysm, without rupture: Secondary | ICD-10-CM

## 2020-11-15 DIAGNOSIS — Z9889 Other specified postprocedural states: Secondary | ICD-10-CM

## 2020-11-15 DIAGNOSIS — Z8249 Family history of ischemic heart disease and other diseases of the circulatory system: Secondary | ICD-10-CM | POA: Insufficient documentation

## 2020-11-15 DIAGNOSIS — J810 Acute pulmonary edema: Secondary | ICD-10-CM

## 2020-11-15 DIAGNOSIS — R0609 Other forms of dyspnea: Secondary | ICD-10-CM

## 2020-11-15 DIAGNOSIS — I3139 Other pericardial effusion (noninflammatory): Secondary | ICD-10-CM

## 2020-11-15 DIAGNOSIS — Z7901 Long term (current) use of anticoagulants: Secondary | ICD-10-CM | POA: Insufficient documentation

## 2020-11-15 HISTORY — DX: Acute diastolic (congestive) heart failure: I50.31

## 2020-11-15 LAB — URINALYSIS, ROUTINE W REFLEX MICROSCOPIC
Bacteria, UA: NONE SEEN
Bilirubin Urine: NEGATIVE
Glucose, UA: NEGATIVE mg/dL
Hgb urine dipstick: NEGATIVE
Ketones, ur: 20 mg/dL — AB
Nitrite: NEGATIVE
Protein, ur: NEGATIVE mg/dL
Specific Gravity, Urine: 1.019 (ref 1.005–1.030)
pH: 6 (ref 5.0–8.0)

## 2020-11-15 LAB — BASIC METABOLIC PANEL
Anion gap: 11 (ref 5–15)
BUN: 12 mg/dL (ref 8–23)
CO2: 25 mmol/L (ref 22–32)
Calcium: 8.5 mg/dL — ABNORMAL LOW (ref 8.9–10.3)
Chloride: 95 mmol/L — ABNORMAL LOW (ref 98–111)
Creatinine, Ser: 0.98 mg/dL (ref 0.61–1.24)
GFR, Estimated: >60 mL/min (ref >60)
Glucose, Bld: 103 mg/dL — ABNORMAL HIGH (ref 70–99)
Potassium: 4.4 mmol/L (ref 3.5–5.1)
Sodium: 131 mmol/L — ABNORMAL LOW (ref 135–145)

## 2020-11-15 LAB — TROPONIN I (HIGH SENSITIVITY): Troponin I (High Sensitivity): 18 ng/L — ABNORMAL HIGH (ref <18)

## 2020-11-15 LAB — RESP PANEL BY RT-PCR (FLU A&B, COVID) ARPGX2
Influenza A by PCR: NEGATIVE
Influenza B by PCR: NEGATIVE
SARS Coronavirus 2 by RT PCR: NEGATIVE

## 2020-11-15 LAB — CBC
HCT: 35.6 % — ABNORMAL LOW (ref 39.0–52.0)
Hemoglobin: 12 g/dL — ABNORMAL LOW (ref 13.0–17.0)
MCH: 31.1 pg (ref 26.0–34.0)
MCHC: 33.7 g/dL (ref 30.0–36.0)
MCV: 92.2 fL (ref 80.0–100.0)
Platelets: 479 10*3/uL — ABNORMAL HIGH (ref 150–400)
RBC: 3.86 MIL/uL — ABNORMAL LOW (ref 4.22–5.81)
RDW: 12.7 % (ref 11.5–15.5)
WBC: 8.8 10*3/uL (ref 4.0–10.5)
nRBC: 0 % (ref 0.0–0.2)

## 2020-11-15 LAB — HEPATIC FUNCTION PANEL
ALT: 48 U/L — ABNORMAL HIGH (ref 0–44)
AST: 22 U/L (ref 15–41)
Albumin: 2.3 g/dL — ABNORMAL LOW (ref 3.5–5.0)
Alkaline Phosphatase: 122 U/L (ref 38–126)
Bilirubin, Direct: 0.1 mg/dL (ref 0.0–0.2)
Indirect Bilirubin: 1 mg/dL — ABNORMAL HIGH (ref 0.3–0.9)
Total Bilirubin: 1.1 mg/dL (ref 0.3–1.2)
Total Protein: 6 g/dL — ABNORMAL LOW (ref 6.5–8.1)

## 2020-11-15 MED ORDER — RIVAROXABAN 20 MG PO TABS
20.0000 mg | ORAL_TABLET | Freq: Every day | ORAL | Status: DC
  Administered 2020-11-15 – 2020-11-16 (×2): 20 mg via ORAL
  Filled 2020-11-15 (×2): qty 1

## 2020-11-15 MED ORDER — ACETAMINOPHEN 325 MG PO TABS: 650.0000 mg | ORAL_TABLET | ORAL | Status: DC | PRN

## 2020-11-15 MED ORDER — FLECAINIDE ACETATE 100 MG PO TABS: 100.0000 mg | ORAL_TABLET | Freq: Two times a day (BID) | ORAL | Status: DC

## 2020-11-15 MED ORDER — PSYLLIUM 51.7 % PO PACK: PACK | ORAL | Status: DC

## 2020-11-15 MED ORDER — LISINOPRIL 10 MG PO TABS
10.0000 mg | ORAL_TABLET | Freq: Every day | ORAL | Status: DC
  Administered 2020-11-15 – 2020-11-23 (×9): 10 mg via ORAL
  Filled 2020-11-15 (×9): qty 1

## 2020-11-15 MED ORDER — FUROSEMIDE 10 MG/ML IJ SOLN
40.0000 mg | Freq: Two times a day (BID) | INTRAMUSCULAR | Status: DC
  Administered 2020-11-16 – 2020-11-18 (×5): 40 mg via INTRAVENOUS
  Filled 2020-11-15 (×5): qty 4

## 2020-11-15 MED ORDER — SODIUM CHLORIDE 0.9% FLUSH
3.0000 mL | INTRAVENOUS | Status: DC | PRN
  Administered 2020-11-16: 3 mL via INTRAVENOUS

## 2020-11-15 MED ORDER — SODIUM CHLORIDE 0.9% FLUSH
3.0000 mL | Freq: Two times a day (BID) | INTRAVENOUS | Status: DC
  Administered 2020-11-15 – 2020-11-24 (×15): 3 mL via INTRAVENOUS

## 2020-11-15 MED ORDER — SODIUM CHLORIDE 0.9 % IV SOLN: 250.0000 mL | INTRAVENOUS | Status: DC | PRN

## 2020-11-15 MED ORDER — ONDANSETRON HCL 4 MG/2ML IJ SOLN: 4.0000 mg | Freq: Four times a day (QID) | INTRAMUSCULAR | Status: DC | PRN

## 2020-11-15 MED ORDER — NITROGLYCERIN 0.4 MG/HR TD PT24
0.4000 mg | MEDICATED_PATCH | Freq: Every day | TRANSDERMAL | Status: DC
  Administered 2020-11-15 – 2020-11-22 (×6): 0.4 mg via TRANSDERMAL
  Filled 2020-11-15 (×9): qty 1

## 2020-11-15 MED ORDER — FUROSEMIDE 10 MG/ML IJ SOLN
40.0000 mg | Freq: Once | INTRAMUSCULAR | Status: AC
  Administered 2020-11-15: 40 mg via INTRAVENOUS
  Filled 2020-11-15: qty 4

## 2020-11-15 MED ORDER — ROSUVASTATIN CALCIUM 5 MG PO TABS
5.0000 mg | ORAL_TABLET | Freq: Every day | ORAL | Status: DC
  Administered 2020-11-15 – 2020-11-23 (×9): 5 mg via ORAL
  Filled 2020-11-15 (×9): qty 1

## 2020-11-15 NOTE — H&P (Addendum)
Ricky Hood is an 74 y.o. male.   Chief Complaint: Shortness of breath HPI:    74 y.o. Caucasian male  with hypertension, hyperlipidemia, prostate enlargement, h/o retinal vein occlusion, s/p dual chamber pacemaker for pacemaker for syncope (09/2020), paroxysmal Afib-now on flecainide, admitted with shortness of breath.  Patient was seen at Afib clinic this morning, had severe shortness of breath. He was thus sent to the ED. Workup in the has thus far showed paced rhythm on EKG, pulmonary edema on chest Xray.   Patient has been having worsening shortness of breath and leg edema for past few days. On 12/10, he was found to be in Afib and was started on Flecainide by Dr. Johney Frame. While he converted to sinus rhythm then, his symptoms of shortness of breath and leg edema have continued to worsen. Wife reports that he wheezes a lot, especially at night. He denies any chest pain.  Past Medical History:  Diagnosis Date  . Hypercholesteremia   . Hypertension   . Paroxysmal atrial fibrillation (HCC)   . 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 Second degree AV block     Past Surgical History:  Procedure Laterality Date  . BIOPSY PROSTATE    . COLONOSCOPY    . PACEMAKER IMPLANT N/A 09/17/2020   SJM Assurity DR implanted by Dr Johney Frame for AV block and syncope     Family History  Problem Relation Age of Onset  . Hypertension Mother   . Hyperlipidemia Mother   . Other Mother        Carotid endarterectomy  . Hypertension Father   . Hyperlipidemia Father     Social History:  reports that he has never smoked. He has never used smokeless tobacco. He reports that he does not drink alcohol and does not use drugs.  Allergies: No Known Allergies  Review of Systems  Constitutional: Negative for decreased appetite, malaise/fatigue, weight gain and weight loss.  HENT: Negative for congestion.   Eyes: Negative for visual disturbance.  Cardiovascular: Positive for dyspnea on  exertion. Negative for chest pain, leg swelling, palpitations and syncope.  Respiratory: Positive for shortness of breath. Negative for cough.   Endocrine: Negative for cold intolerance.  Hematologic/Lymphatic: Does not bruise/bleed easily.  Skin: Negative for itching and rash.  Musculoskeletal: Negative for myalgias.  Gastrointestinal: Negative for abdominal pain, nausea and vomiting.  Genitourinary: Negative for dysuria.  Neurological: Negative for dizziness and weakness.  Psychiatric/Behavioral: The patient is not nervous/anxious.   All other systems reviewed and are negative.    Blood pressure (!) 155/77, pulse 75, temperature 98.3 F (36.8 C), resp. rate (!) 23, height 5\' 7"  (1.702 m), weight 85.7 kg, SpO2 94 %. Body mass index is 29.6 kg/m.  Physical Exam Vitals and nursing note reviewed.  Constitutional:      General: He is in acute distress.     Appearance: He is well-developed.  HENT:     Head: Normocephalic and atraumatic.  Eyes:     Conjunctiva/sclera: Conjunctivae normal.     Pupils: Pupils are equal, round, and reactive to light.  Neck:     Vascular: No JVD.  Cardiovascular:     Rate and Rhythm: Normal rate and regular rhythm.     Pulses: Normal pulses and intact distal pulses.     Heart sounds: No murmur heard.   Pulmonary:     Effort: Tachypnea, accessory muscle usage and respiratory distress present.     Breath sounds: Examination of the right-lower field reveals wheezing.  Examination of the left-lower field reveals wheezing. Wheezing present. No rales.  Abdominal:     General: Bowel sounds are normal.     Palpations: Abdomen is soft.     Tenderness: There is no rebound.  Musculoskeletal:        General: No tenderness. Normal range of motion.     Right lower leg: Edema (2+) present.     Left lower leg: Edema (2+) present.  Lymphadenopathy:     Cervical: No cervical adenopathy.  Skin:    General: Skin is warm and dry.  Neurological:     Mental  Status: He is alert and oriented to person, place, and time.     Cranial Nerves: No cranial nerve deficit.     Results for orders placed or performed during the hospital encounter of 11/15/20 (from the past 48 hour(s))  Basic metabolic panel     Status: Abnormal   Collection Time: 11/15/20  9:53 AM  Result Value Ref Range   Sodium 131 (L) 135 - 145 mmol/L   Potassium 4.4 3.5 - 5.1 mmol/L   Chloride 95 (L) 98 - 111 mmol/L   CO2 25 22 - 32 mmol/L   Glucose, Bld 103 (H) 70 - 99 mg/dL    Comment: Glucose reference range applies only to samples taken after fasting for at least 8 hours.   BUN 12 8 - 23 mg/dL   Creatinine, Ser 7.65 0.61 - 1.24 mg/dL   Calcium 8.5 (L) 8.9 - 10.3 mg/dL   GFR, Estimated >46 >50 mL/min    Comment: (NOTE) Calculated using the CKD-EPI Creatinine Equation (2021)    Anion gap 11 5 - 15    Comment: Performed at [facility], [address], [place], [place] [address]  CBC     Status: Abnormal   Collection Time: 11/15/20  9:53 AM  Result Value Ref Range   WBC 8.8 4.0 - 10.5 K/uL   RBC 3.86 (L) 4.22 - 5.81 MIL/uL   Hemoglobin 12.0 (L) 13.0 - 17.0 g/dL   HCT 68.1 (L) 27.5 - 17.0 %   MCV 92.2 80.0 - 100.0 fL   MCH 31.1 26.0 - 34.0 pg   MCHC 33.7 30.0 - 36.0 g/dL   RDW 01.7 49.4 - 49.6 %   Platelets 479 (H) 150 - 400 K/uL   nRBC 0.0 0.0 - 0.2 %    Comment: Performed at [facility], [address], [place], [place] [address]    Labs:   Lab Results  Component Value Date   WBC 8.8 11/15/2020   HGB 12.0 (L) 11/15/2020   HCT 35.6 (L) 11/15/2020   MCV 92.2 11/15/2020   PLT 479 (H) 11/15/2020    Recent Labs  Lab 11/15/20 0953  NA 131*  K 4.4  CL 95*  CO2 25  BUN 12  CREATININE 0.98  CALCIUM 8.5*  GLUCOSE 103*    TSH Recent Labs    09/16/20 2200  TSH 2.953     (Not in a hospital admission)     Current Facility-Administered Medications:  .  furosemide (LASIX) injection 40 mg, 40 mg, Intravenous, Once, Blane Ohara,  MD  Current Outpatient Medications:  .  Cholecalciferol (VITAMIN D3) 50 MCG (2000 UT) TABS, Take 2,000 Units by mouth at bedtime., Disp: , Rfl:  .  co-enzyme Q-10 50 MG capsule, Take 50 mg by mouth daily., Disp: , Rfl:  .  finasteride (PROSCAR) 5 MG tablet, Take 5 mg by mouth at  bedtime. , Disp: , Rfl:  .  fish oil-omega-3 fatty acids 1000 MG capsule, Take 3 g by mouth at bedtime. , Disp: , Rfl:  .  flecainide (TAMBOCOR) 100 MG tablet, Take 1 tablet (100 mg total) by mouth 2 (two) times daily., Disp: 180 tablet, Rfl: 3 .  lisinopril (ZESTRIL) 10 MG tablet, Take 10 mg by mouth at bedtime. , Disp: , Rfl:  .  loratadine (CLARITIN) 10 MG tablet, Take 10 mg by mouth at bedtime. , Disp: , Rfl:  .  Multiple Vitamins-Minerals (CENTRUM SILVER 50+MEN) TABS, Take 1 tablet by mouth at bedtime., Disp: , Rfl:  .  Psyllium (METAMUCIL FIBER PO), Take by mouth See admin instructions. Mix 1 teaspoonful of powder into 6-8 ounces of juice and drink in the morning and before supper, Disp: , Rfl:  .  rivaroxaban (XARELTO) 20 MG TABS tablet, Take 1 tablet (20 mg total) by mouth daily with supper., Disp: 90 tablet, Rfl: 3 .  rosuvastatin (CRESTOR) 5 MG tablet, Take 5 mg by mouth at bedtime. , Disp: , Rfl:  .  TART CHERRY PO, Take 10 mLs by mouth in the morning and at bedtime., Disp: , Rfl:    Today's Vitals   11/15/20 1645 11/15/20 1700 11/15/20 1715 11/15/20 1759  BP: (!) 150/80 (!) 169/86 (!) 155/77   Pulse: 75 75 75   Resp: (!) 26 (!) 22 (!) 23   Temp:      TempSrc:      SpO2: 95% 95% 95% 94%  Weight:      Height:       Body mass index is 29.6 kg/m.    CARDIAC STUDIES:  EKG 11/15/2020: Sinus rhythm Ventricular paced rhythm  Echocardiogram 09/17/2020: 1. Left ventricular ejection fraction, by estimation, is 65 to 70%. The  left ventricle has hyperdynamic function. The left ventricle has no  regional wall motion abnormalities. Left ventricular diastolic function  could not be evaluated.  2.  Right ventricular systolic function is normal. The right ventricular  size is normal. There is mildly elevated pulmonary artery systolic  pressure. Estimated RVSP 39 mmHg.  3. Mild mitral regurgitation. Moderate to severe tricuspid regurgitation.   Assessment/Plan  74 y.o. Caucasian male  with hypertension, hyperlipidemia, prostate enlargement, h/o retinal vein occlusion, s/p dual chamber pacemaker for pacemaker for syncope (09/2020), paroxysmal Afib-now on flecainide, now with pulmonary edema  Pulmonary edema: New finding. Acute diastolic heart failure. Previously normal EF. Admitted to telemetry. Etiology unclear at this time. May need to consider cath after diuresing.  Hypertensive urgency could be possible, but blood pressure is generally well controlled. Use home medication lisinopril 10 mg for now.  IV lasix 40 mg bid.  Will use nitro paste for preload reduction. Strict I/O, daily weights Echocardiogram pending BNP pending  Trop HS elevation: Do not suspect ACS. Secondary to heart failure  Hyponatremia: Likely dilutional in the setting of heart failure Continue diuresis.  Paroxysmal Afib: CHA2DS2VASc score at least 2, stroke risk at least 2% Continue Xarelto 20 mg daily.  In light of new diagnosis of heart failure, will stop flecainide. After management of heart failure, could consider amiodarone or tikosyn.   Patient was admitted by myself in 09/2020, pacemaker placed by Dr. Johney Frame. Patient had plans to follow up with Dr. Tomie China on long term, since he lives in [place]. I will request [facility] team to take over his care for continuity of care.    Elder Negus, MD Pager: [phone] Office: [phone]

## 2020-11-15 NOTE — ED Notes (Signed)
EKG given to Dr. Zavitz 

## 2020-11-15 NOTE — ED Provider Notes (Signed)
[facility] EMERGENCY DEPARTMENT Provider Note   CSN: [id number] Arrival date & time: 11/15/20  0947     History Chief Complaint  Patient presents with   Shortness of Breath    Anthone Prieur is a 74 y.o. male.  Patient with history of dual-chamber pacemaker placed in October, high blood pressure, high cholesterol, recent atrial fibrillation detected and placed on flecainide and Xarelto presents with worsening exertional shortness of breath and 20 pounds weight gain over the past 10 days. Patient very dyspneic with exertion especially last 3 days. No history of similar. No chest pain, fever or productive cough. Mild wheezing and dry cough. No blood clot in the lung history or legs. No major surgery recently.        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   Second degree AV block     Patient Active Problem List   Diagnosis Date Noted   [employer] MRI conditional  dual-chamber pacemaker for symptomatic complete heart block   09/24/2020   Complete heart block (HCC) 09/16/2020    Past Surgical History:  Procedure Laterality Date   BIOPSY PROSTATE     COLONOSCOPY     PACEMAKER IMPLANT N/A 09/17/2020   SJM Assurity DR implanted by Dr Johney Frame for AV block and syncope       Family History  Problem Relation Age of Onset   Hypertension Mother    Hyperlipidemia Mother    Other Mother        Carotid endarterectomy   Hypertension Father    Hyperlipidemia Father     Social History   Tobacco Use   Smoking status: Never Smoker   Smokeless tobacco: Never Used  Vaping Use   Vaping Use: Never used  Substance Use Topics   Alcohol use: Never   Drug use: Never    Home Medications Prior to Admission medications   Medication Sig Start Date End Date Taking? Authorizing Provider   Cholecalciferol (VITAMIN D3) 50 MCG (2000 UT) TABS Take 2,000 Units by mouth at bedtime.    [provider]  co-enzyme Q-10 50 MG capsule Take 50 mg by mouth daily.    [provider]  finasteride (PROSCAR) 5 MG tablet Take 5 mg by mouth at bedtime.     [provider]  fish oil-omega-3 fatty acids 1000 MG capsule Take 3 g by mouth at bedtime.     [provider]  flecainide (TAMBOCOR) 100 MG tablet Take 1 tablet (100 mg total) by mouth 2 (two) times daily. 11/08/20   Allred, Fayrene Fearing, MD  lisinopril (ZESTRIL) 10 MG tablet Take 10 mg by mouth at bedtime.     [provider]  loratadine (CLARITIN) 10 MG tablet Take 10 mg by mouth at bedtime.     [provider]  Multiple Vitamins-Minerals (CENTRUM SILVER 50+MEN) TABS Take 1 tablet by mouth at bedtime.    [provider]  Psyllium (METAMUCIL FIBER PO) Take by mouth See admin instructions. Mix 1 teaspoonful of powder into 6-8 ounces of juice and drink in the morning and before supper    [provider]  rivaroxaban (XARELTO) 20 MG TABS tablet Take 1 tablet (20 mg total) by mouth daily with supper. 11/08/20   Allred, Fayrene Fearing, MD  rosuvastatin (CRESTOR) 5 MG tablet Take 5 mg by mouth at bedtime.  07/26/20  [provider]  TART CHERRY PO Take 10 mLs by mouth in the morning and at bedtime.    [provider]    Allergies    Patient has no known allergies.  Review of Systems   Review of Systems  Constitutional: Positive for fatigue. Negative for chills and fever.  HENT: Negative for congestion.   Eyes: Negative for visual disturbance.  Respiratory: Positive for cough and shortness of breath.   Cardiovascular: Positive for leg swelling. Negative for chest pain.  Gastrointestinal: Negative for abdominal pain and vomiting.  Genitourinary: Negative for dysuria and flank pain.  Musculoskeletal: Negative for back pain, neck pain and neck stiffness.  Skin: Negative  for rash.  Neurological: Negative for light-headedness and headaches.    Physical Exam Updated Vital Signs BP (!) 155/77    Pulse 75    Temp 98.3 F (36.8 C)    Resp (!) 23    Ht 5\' 7"  (1.702 m)    Wt 85.7 kg    SpO2 95%    BMI 29.60 kg/m   Physical Exam Vitals and nursing note reviewed.  Constitutional:      Appearance: He is well-developed and well-nourished.  HENT:     Head: Normocephalic and atraumatic.  Eyes:     General:        Right eye: No discharge.        Left eye: No discharge.     Conjunctiva/sclera: Conjunctivae normal.  Neck:     Trachea: No tracheal deviation.  Cardiovascular:     Rate and Rhythm: Normal rate and regular rhythm.  Pulmonary:     Effort: Pulmonary effort is normal.     Breath sounds: Examination of the right-middle field reveals wheezing. Examination of the left-middle field reveals wheezing. Examination of the right-lower field reveals wheezing and rhonchi. Examination of the left-lower field reveals wheezing and rhonchi. Wheezing and rhonchi present.  Abdominal:     General: There is no distension.     Palpations: Abdomen is soft.     Tenderness: There is no abdominal tenderness. There is no guarding.  Musculoskeletal:     Cervical back: Normal range of motion and neck supple.     Right lower leg: Edema present.     Left lower leg: Edema present.  Skin:    General: Skin is warm.     Findings: No rash.  Neurological:     Mental Status: He is alert and oriented to person, place, and time.  Psychiatric:        Mood and Affect: Mood and affect normal.     ED Results / Procedures / Treatments   Labs (all labs ordered are listed, but only abnormal results are displayed) Labs Reviewed  BASIC METABOLIC PANEL - Abnormal; Notable for the following components:      Result Value   Sodium 131 (*)    Chloride 95 (*)    Glucose, Bld 103 (*)    Calcium 8.5 (*)    All other components within normal limits  CBC - Abnormal; Notable for the  following components:   RBC 3.86 (*)    Hemoglobin 12.0 (*)    HCT 35.6 (*)    Platelets 479 (*)    All other components within normal limits  RESP PANEL BY RT-PCR (FLU A&B, COVID) ARPGX2  BRAIN NATRIURETIC PEPTIDE  HEPATIC FUNCTION PANEL  URINALYSIS, ROUTINE W REFLEX MICROSCOPIC  TROPONIN I (HIGH SENSITIVITY)    EKG None  Radiology DG Chest 2  View  Result Date: 11/15/2020 CLINICAL DATA:  shob EXAM: CHEST - 2 VIEW COMPARISON:  10/2o/2021 and prior. FINDINGS: Dual lead left chest wall pacing device. New patchy right upper lung and bibasilar opacities. No pneumothorax. Trace right and moderate left pleural effusions. Partially obscured cardiomediastinal silhouette. No acute osseous abnormality. IMPRESSION: Patchy bilateral pulmonary opacities, infection versus edema. New moderate left and trace right pleural effusions. Electronically Signed   By: Stana Bunting M.D.   On: 11/15/2020 10:12    Procedures Procedures (including critical care time)  Medications Ordered in ED Medications  furosemide (LASIX) injection 40 mg (has no administration in time range)    ED Course  I have reviewed the triage vital signs and the nursing notes.  Pertinent labs & imaging results that were available during my care of the patient were reviewed by me and considered in my medical decision making (see chart for details).    MDM Rules/Calculators/A&P                          Patient presents with clinical concern for pulmonary edema, no specific cause at this time. This is new for this patient, patient hypertensive in the room 170 systolic, oxygen 41% However dyspneic even with talking. X-ray reviewed considered pulmonary edema in addition to exam. Blood work reviewed showed mild hyponatremia 131, mild anemia 12. Troponin and BNP added on. Discussed with nursing to repeat EKG as it was not put through the system properly initially. Patient has no active chest pain. Discussed with cardiology,  repaged Dr. Jacinto Halim for admission. 40 mg IV Lasix ordered. Kidney function normal, potassium normal. Covid test sent for admission.  Final Clinical Impression(s) / ED Diagnoses Final diagnoses:  Acute pulmonary edema (HCC)  Dyspnea on exertion    Rx / DC Orders ED Discharge Orders    None       Blane Ohara, MD 11/16/20 0013

## 2020-11-15 NOTE — Progress Notes (Signed)
Primary Care Physician: Marlyn Corporal, PA Referring Physician:Dr. Johney Frame   Ricky Hood is a 74 y.o. male with a h/o PPM for CHB. Was seen  by Dr. Johney Frame 12/10 and found to be in new onset afib. He is device dependant. He was started on flecainide 100 mg bid and xarelto 20 mg daily. He called to the office and was c/o of PND/orthopnea with weight gain and has not slept x 4 nights. Pulse ox drops into the 80's with walking. He feels miserable and the wife is very concerned about him to the point she does not want to take him home. I calculated that his weight is up 21 lbs since the last of October. He is  usually very active but can not do anything but sit recently. The good news is that he appears to be in rhythm today being A sensed v paced at 74 ms.   Today, he denies symptoms of palpitations, chest pain, shortness of breath, orthopnea, PND, lower extremity edema, dizziness, presyncope, syncope, or neurologic sequela. The patient is tolerating medications without difficulties and is otherwise without complaint today.   Past Medical History:  Diagnosis Date  . Hypercholesteremia   . Hypertension   . Paroxysmal atrial fibrillation (HCC)   . 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 Second degree AV block    Past Surgical History:  Procedure Laterality Date  . BIOPSY PROSTATE    . COLONOSCOPY    . PACEMAKER IMPLANT N/A 09/17/2020   SJM Assurity DR implanted by Dr Johney Frame for AV block and syncope    Current Outpatient Medications  Medication Sig Dispense Refill  . Cholecalciferol (VITAMIN D3) 50 MCG (2000 UT) TABS Take 2,000 Units by mouth at bedtime.    Marland Kitchen co-enzyme Q-10 50 MG capsule Take 50 mg by mouth daily.    . finasteride (PROSCAR) 5 MG tablet Take 5 mg by mouth at bedtime.     . fish oil-omega-3 fatty acids 1000 MG capsule Take 3 g by mouth at bedtime.     . flecainide (TAMBOCOR) 100 MG tablet Take 1 tablet (100 mg total) by mouth 2 (two) times daily. 180 tablet 3   . lisinopril (ZESTRIL) 10 MG tablet Take 10 mg by mouth at bedtime.     Marland Kitchen loratadine (CLARITIN) 10 MG tablet Take 10 mg by mouth at bedtime.     . Multiple Vitamins-Minerals (CENTRUM SILVER 50+MEN) TABS Take 1 tablet by mouth at bedtime.    . Psyllium (METAMUCIL FIBER PO) Take by mouth See admin instructions. Mix 1 teaspoonful of powder into 6-8 ounces of juice and drink in the morning and before supper    . rivaroxaban (XARELTO) 20 MG TABS tablet Take 1 tablet (20 mg total) by mouth daily with supper. 90 tablet 3  . rosuvastatin (CRESTOR) 5 MG tablet Take 5 mg by mouth at bedtime.     Marland Kitchen TART CHERRY PO Take 10 mLs by mouth in the morning and at bedtime.     No current facility-administered medications for this encounter.    No Known Allergies  Social History   Socioeconomic History  . Marital status: Married    Spouse name: Not on file  . Number of children: 3  . Years of education: Not on file  . Highest education level: Not on file  Occupational History  . Not on file  Tobacco Use  . Smoking status: Never Smoker  . Smokeless tobacco: Never Used  Vaping Use  .  Vaping Use: Never used  Substance and Sexual Activity  . Alcohol use: Never  . Drug use: Never  . Sexual activity: Not on file  Other Topics Concern  . Not on file  Social History Narrative  . Not on file   Social Determinants of Health   Financial Resource Strain: Not on file  Food Insecurity: Not on file  Transportation Needs: Not on file  Physical Activity: Not on file  Stress: Not on file  Social Connections: Not on file  Intimate Partner Violence: Not on file    Family History  Problem Relation Age of Onset  . Hypertension Mother   . Hyperlipidemia Mother   . Other Mother        Carotid endarterectomy  . Hypertension Father   . Hyperlipidemia Father     ROS- All systems are reviewed and negative except as per the HPI above  Physical Exam: Vitals:   11/15/20 0829  Pulse: 74  Weight: 85.9 kg   Height: 5\' 7"  (1.702 m)   Wt Readings from Last 3 Encounters:  11/15/20 85.9 kg  11/08/20 82.6 kg  09/25/20 76.2 kg    Labs: Lab Results  Component Value Date   NA 132 (L) 09/18/2020   K 4.3 09/18/2020   CL 100 09/18/2020   CO2 25 09/18/2020   GLUCOSE 105 (H) 09/18/2020   BUN 11 09/18/2020   CREATININE 1.01 09/18/2020   CALCIUM 8.3 (L) 09/18/2020   No results found for: INR No results found for: CHOL, HDL, LDLCALC, TRIG   GEN- The patient is well appearing, alert and oriented x 3 today.   Head- normocephalic, atraumatic Eyes-  Sclera clear, conjunctiva pink Ears- hearing intact Oropharynx- clear Neck- supple, no JVP Lymph- no cervical lymphadenopathy Lungs- fine rales bilateral bases  Heart- Regular rate and rhythm, no murmurs, rubs or gallops, PMI not laterally displaced Abdomen  mildly distended  Extremities-2+ edema  MS- no significant deformity or atrophy Skin- no rash or lesion Psych- euthymic mood, full affect Neuro- strength and sensation are intact  EKG-a sensed v paced at 74 bpm    Assessment and Plan: 1. afib Recent onset afib Started on flecainide 100 mg bid  and xarelto 20 mg daily with a CHA2DS2VASc of 1 He has gained around 21 lbs of  fluid since the last of October now with ambulatory hypoxia, PND/orthopnea and has not slept x 4 nights I discussed with pt/wife and Dr. November and will send him to  ER as a bed is not available for direct admit for diuresis Pt is aware that the ER is very busy and he will have a wait  To ER   Johney Frame C. [facility] [address] [place], [place] [phone]

## 2020-11-15 NOTE — ED Notes (Signed)
Attempted report x1. 

## 2020-11-15 NOTE — ED Notes (Signed)
Xarelto and nitro are currently unverified by pharmacist

## 2020-11-15 NOTE — ED Notes (Signed)
Urinal placed at bedside pt unable to void at this time

## 2020-11-15 NOTE — ED Triage Notes (Signed)
Pt here with reports of 20lb fluid retention over the last few weeks. Pt endorses shob X1 week. Pt states when he walks his oxygen level falls low. Denies hx CHF.

## 2020-11-16 ENCOUNTER — Inpatient Hospital Stay (HOSPITAL_COMMUNITY): Payer: Medicare Other

## 2020-11-16 ENCOUNTER — Encounter (HOSPITAL_COMMUNITY): Payer: Self-pay | Admitting: Cardiology

## 2020-11-16 DIAGNOSIS — I5021 Acute systolic (congestive) heart failure: Secondary | ICD-10-CM

## 2020-11-16 LAB — ECHOCARDIOGRAM COMPLETE
Area-P 1/2: 1.69 cm2
Height: 67 in
S' Lateral: 2.4 cm
Weight: 3024 oz

## 2020-11-16 LAB — BASIC METABOLIC PANEL
Anion gap: 11 (ref 5–15)
BUN: 14 mg/dL (ref 8–23)
CO2: 28 mmol/L (ref 22–32)
Calcium: 8.3 mg/dL — ABNORMAL LOW (ref 8.9–10.3)
Chloride: 95 mmol/L — ABNORMAL LOW (ref 98–111)
Creatinine, Ser: 0.94 mg/dL (ref 0.61–1.24)
GFR, Estimated: >60 mL/min (ref >60)
Glucose, Bld: 105 mg/dL — ABNORMAL HIGH (ref 70–99)
Potassium: 4.1 mmol/L (ref 3.5–5.1)
Sodium: 134 mmol/L — ABNORMAL LOW (ref 135–145)

## 2020-11-16 LAB — TSH: TSH: 1.706 u[IU]/mL (ref 0.350–4.500)

## 2020-11-16 LAB — BRAIN NATRIURETIC PEPTIDE: B Natriuretic Peptide: 481.4 pg/mL — ABNORMAL HIGH (ref 0.0–100.0)

## 2020-11-16 LAB — SEDIMENTATION RATE: Sed Rate: 70 mm/hr — ABNORMAL HIGH (ref 0–16)

## 2020-11-16 NOTE — Progress Notes (Signed)
Progress Note  Patient Name: Ricky Hood Date of Encounter: 11/16/2020  [facility] Cardiologist:  establishing with Dr Tomie China. EP Dr Johney Frame  Subjective   Ongoing SOB  Inpatient Medications    Scheduled Meds: . furosemide  40 mg Intravenous BID  . lisinopril  10 mg Oral QHS  . nitroGLYCERIN  0.4 mg Transdermal Daily  . rivaroxaban  20 mg Oral Q supper  . rosuvastatin  5 mg Oral QHS  . sodium chloride flush  3 mL Intravenous Q12H   Continuous Infusions: . sodium chloride     PRN Meds: sodium chloride, acetaminophen, ondansetron (ZOFRAN) IV, sodium chloride flush   Vital Signs    Vitals:   11/15/20 2245 11/15/20 2300 11/15/20 2330 11/16/20 0753  BP: 129/65 131/67  134/64  Pulse: 70 69 70 85  Resp: (!) 22 (!) 23 (!) 21 18  Temp:    98.4 F (36.9 C)  TempSrc:    Oral  SpO2: 93% 92% 94% 97%  Weight:      Height:        Intake/Output Summary (Last 24 hours) at 11/16/2020 0814 Last data filed at 11/15/2020 2345 Gross per 24 hour  Intake 40 ml  Output 2025 ml  Net -1985 ml   Last 3 Weights 11/15/2020 11/15/2020 11/15/2020  Weight (lbs) 189 lb 189 lb 189 lb 6.4 oz  Weight (kg) 85.73 kg 85.73 kg 85.911 kg      Telemetry    A sensed V paced - Personally Reviewed  ECG    n/a- Personally Reviewed  Physical Exam   GEN: No acute distress.   Neck: No JVD Cardiac: RRR, no murmurs, rubs, or gallops.  Respiratory: Clear to auscultation bilaterally. GI: Soft, nontender, non-distended  MS: 1-2+ bilateral LE edema. Neuro:  Nonfocal  Psych: Normal affect   Labs    High Sensitivity Troponin:   Recent Labs  Lab 11/15/20 1639  TROPONINIHS 18*      Chemistry Recent Labs  Lab 11/15/20 0953 11/15/20 1639 11/16/20 0159  NA 131*  --  134*  K 4.4  --  4.1  CL 95*  --  95*  CO2 25  --  28  GLUCOSE 103*  --  105*  BUN 12  --  14  CREATININE 0.98  --  0.94  CALCIUM 8.5*  --  8.3*  PROT  --  6.0*  --   ALBUMIN  --  2.3*  --   AST  --  22  --    ALT  --  48*  --   ALKPHOS  --  122  --   BILITOT  --  1.1  --   GFRNONAA >60  --  >60  ANIONGAP 11  --  11     Hematology Recent Labs  Lab 11/15/20 0953  WBC 8.8  RBC 3.86*  HGB 12.0*  HCT 35.6*  MCV 92.2  MCH 31.1  MCHC 33.7  RDW 12.7  PLT 479*    BNP Recent Labs  Lab 11/16/20 0159  BNP 481.4*     DDimer No results for input(s): DDIMER in the last 168 hours.   Radiology    DG Chest 2 View  Result Date: 11/15/2020 CLINICAL DATA:  shob EXAM: CHEST - 2 VIEW COMPARISON:  10/2o/2021 and prior. FINDINGS: Dual lead left chest wall pacing device. New patchy right upper lung and bibasilar opacities. No pneumothorax. Trace right and moderate left pleural effusions. Partially obscured cardiomediastinal silhouette. No acute osseous abnormality. IMPRESSION:  Patchy bilateral pulmonary opacities, infection versus edema. New moderate left and trace right pleural effusions. Electronically Signed   By: Stana Bunting M.D.   On: 11/15/2020 10:12    Cardiac Studies     Patient Profile     74 y.o.Caucasianmalewith hypertension, hyperlipidemia,prostate enlargement,h/o retinal vein occlusion,s/p dual chamber pacemaker for pacemaker for syncope (09/2020), paroxysmal Afib-now on flecainide, admitted with shortness of breath.  Assessment & Plan    Patient admitted initially by [place], followed by [facility] EP clinic and will be establishing in general cardiology clinic in [place] with Dr Tomie China, care transferred to [facility] as primary.    1. Acute heart failure - new clinical diagnosis, presented with volume overload. From report 21 lbs weight gain at home.  - BNP 481, CXR patchy opacities. , moderate left and trace right effusion - 08/2020 echo LVEF 65-70%, indet DDx, normal RV, mod to severe TR, mild MR - repeat echo is pending  - received IV lasix 40mg  x 1 yesterday, due for 40mg  bid today. Limited I/Os thus far but negative around 2L overnight.  Renal function is stable, continue IV diuresis today  2. Hyponatremia - presentation consistent with hypervolemic hyponatreemia, improving with diuresis  3. PAF - new diagnosis 11/08/20, started on flecanide - in setting of clinical HF flecanide stopped on admission   4. History of complete heart blocker - has PPM followed by EP   For questions or updates, please contact [facility] Please consult [email or web address] for contact info under        Signed, , MD  11/16/2020, 8:14 AM

## 2020-11-17 DIAGNOSIS — I5031 Acute diastolic (congestive) heart failure: Secondary | ICD-10-CM

## 2020-11-17 LAB — BASIC METABOLIC PANEL
Anion gap: 12 (ref 5–15)
BUN: 19 mg/dL (ref 8–23)
CO2: 28 mmol/L (ref 22–32)
Calcium: 8.2 mg/dL — ABNORMAL LOW (ref 8.9–10.3)
Chloride: 94 mmol/L — ABNORMAL LOW (ref 98–111)
Creatinine, Ser: 1.07 mg/dL (ref 0.61–1.24)
GFR, Estimated: >60 mL/min (ref >60)
Glucose, Bld: 120 mg/dL — ABNORMAL HIGH (ref 70–99)
Potassium: 4 mmol/L (ref 3.5–5.1)
Sodium: 134 mmol/L — ABNORMAL LOW (ref 135–145)

## 2020-11-17 LAB — CBC
HCT: 35.2 % — ABNORMAL LOW (ref 39.0–52.0)
Hemoglobin: 11.6 g/dL — ABNORMAL LOW (ref 13.0–17.0)
MCH: 29.4 pg (ref 26.0–34.0)
MCHC: 33 g/dL (ref 30.0–36.0)
MCV: 89.3 fL (ref 80.0–100.0)
Platelets: 478 10*3/uL — ABNORMAL HIGH (ref 150–400)
RBC: 3.94 MIL/uL — ABNORMAL LOW (ref 4.22–5.81)
RDW: 12.6 % (ref 11.5–15.5)
WBC: 10.7 10*3/uL — ABNORMAL HIGH (ref 4.0–10.5)
nRBC: 0 % (ref 0.0–0.2)

## 2020-11-17 LAB — MAGNESIUM: Magnesium: 2 mg/dL (ref 1.7–2.4)

## 2020-11-17 MED ORDER — PSYLLIUM 95 % PO PACK
1.0000 | PACK | Freq: Every day | ORAL | Status: DC | PRN
  Filled 2020-11-17: qty 1

## 2020-11-17 MED ORDER — ORAL CARE MOUTH RINSE
15.0000 mL | Freq: Two times a day (BID) | OROMUCOSAL | Status: DC
  Administered 2020-11-17 – 2020-11-22 (×7): 15 mL via OROMUCOSAL

## 2020-11-17 NOTE — Progress Notes (Signed)
Progress Note  Patient Name: Ricky Hood Date of Encounter: 11/17/2020  [facility] Cardiologist: establishing with Dr Geraldo Pitter. EP Dr Rayann Heman  Subjective   SOB improving.   Inpatient Medications    Scheduled Meds: . furosemide  40 mg Intravenous BID  . lisinopril  10 mg Oral QHS  . nitroGLYCERIN  0.4 mg Transdermal Daily  . rosuvastatin  5 mg Oral QHS  . sodium chloride flush  3 mL Intravenous Q12H   Continuous Infusions: . sodium chloride     PRN Meds: sodium chloride, acetaminophen, ondansetron (ZOFRAN) IV, psyllium, sodium chloride flush   Vital Signs    Vitals:   11/16/20 2104 11/16/20 2330 11/17/20 0522 11/17/20 0800  BP: 135/75  (!) 148/75 (!) 145/60  Pulse: 92 76  78  Resp: 19 (!) 22  20  Temp: 98.4 F (36.9 C)  98 F (36.7 C) 98 F (36.7 C)  TempSrc: Oral  Oral Oral  SpO2: 94% 90%  93%  Weight:   78.6 kg   Height:        Intake/Output Summary (Last 24 hours) at 11/17/2020 0841 Last data filed at 11/17/2020 0500 Gross per 24 hour  Intake 129 ml  Output 3350 ml  Net -3221 ml   Last 3 Weights 11/17/2020 11/15/2020 11/15/2020  Weight (lbs) 173 lb 4.8 oz 189 lb 189 lb  Weight (kg) 78.608 kg 85.73 kg 85.73 kg      Telemetry    AV paced - Personally Reviewed  ECG    n/a - Personally Reviewed  Physical Exam   GEN: No acute distress.   Neck: No JVD Cardiac: RRR, no murmurs, rubs, or gallops.  Respiratory: faint crackles bilateral bases GI: Soft, nontender, non-distended  MS: No edema; No deformity. Neuro:  Nonfocal  Psych: Normal affect   Labs    High Sensitivity Troponin:   Recent Labs  Lab 11/15/20 1639  TROPONINIHS 18*      Chemistry Recent Labs  Lab 11/15/20 0953 11/15/20 1639 11/16/20 0159 11/17/20 0121  NA 131*  --  134* 134*  K 4.4  --  4.1 4.0  CL 95*  --  95* 94*  CO2 25  --  28 28  GLUCOSE 103*  --  105* 120*  BUN 12  --  14 19  CREATININE 0.98  --  0.94 1.07  CALCIUM 8.5*  --  8.3* 8.2*  PROT  --   6.0*  --   --   ALBUMIN  --  2.3*  --   --   AST  --  22  --   --   ALT  --  48*  --   --   ALKPHOS  --  122  --   --   BILITOT  --  1.1  --   --   GFRNONAA >60  --  >60 >60  ANIONGAP 11  --  11 12     Hematology Recent Labs  Lab 11/15/20 0953 11/17/20 0121  WBC 8.8 10.7*  RBC 3.86* 3.94*  HGB 12.0* 11.6*  HCT 35.6* 35.2*  MCV 92.2 89.3  MCH 31.1 29.4  MCHC 33.7 33.0  RDW 12.7 12.6  PLT 479* 478*    BNP Recent Labs  Lab 11/16/20 0159  BNP 481.4*     DDimer No results for input(s): DDIMER in the last 168 hours.   Radiology    DG Chest 2 View  Result Date: 11/15/2020 CLINICAL DATA:  shob EXAM: CHEST - 2 VIEW  COMPARISON:  10/2o/2021 and prior. FINDINGS: Dual lead left chest wall pacing device. New patchy right upper lung and bibasilar opacities. No pneumothorax. Trace right and moderate left pleural effusions. Partially obscured cardiomediastinal silhouette. No acute osseous abnormality. IMPRESSION: Patchy bilateral pulmonary opacities, infection versus edema. New moderate left and trace right pleural effusions. Electronically Signed   By: Primitivo Gauze M.D.   On: 11/15/2020 10:12   ECHOCARDIOGRAM COMPLETE  Result Date: 11/16/2020    ECHOCARDIOGRAM REPORT   Patient Name:   Ricky Hood Date of Exam: 11/16/2020 Medical Rec #:  [id number]      Height:       67.0 in Accession #:    [id number]     Weight:       189.0 lb Date of Birth:  [birth year]      BSA:          1.974 m Patient Age:    74 years       BP:           134/64 mmHg Patient Gender: M              HR:           67 bpm. Exam Location:  Inpatient Procedure: 2D Echo, Color Doppler and Cardiac Doppler Indications:    S97.02 Acute diastolic (congestive) heart failure  History:        Patient has prior history of Echocardiogram examinations, most                 recent 09/17/2020. Pacemaker; Risk Factors:Hypertension and                 Dyslipidemia. Pacemaker placed 08/2020.  Sonographer:    Raquel Sarna Senior RDCS  Referring Phys: [id number] [place]  1. There is a large pericardial effusion present measuring 2.6cm at end diastole. Appears to be largest at the LV apex. The is about 25% respiratory variation across the mitral valve. IVC is dilated but collapses >50% with inspiration suggesting RAP 39mHg. There is no RV/RA diastolic collapse. Overall, no tamponade physiology present.  2. Left ventricular ejection fraction, by estimation, is 60 to 65%. The left ventricle has normal function. The left ventricle has no regional wall motion abnormalities. There is mild concentric left ventricular hypertrophy. Left ventricular diastolic parameters are consistent with Grade I diastolic dysfunction (impaired relaxation).  3. Right ventricular systolic function is normal. The right ventricular size is normal. There is moderately elevated pulmonary artery systolic pressure.  4. The mitral valve is normal in structure. Mild mitral valve regurgitation.  5. Tricuspid valve regurgitation is moderate.  6. The aortic valve is tricuspid. There is mild calcification of the aortic valve. There is mild thickening of the aortic valve. Aortic valve regurgitation is mild. Mild aortic valve sclerosis is present, with no evidence of aortic valve stenosis.  7. Aortic dilatation noted. There is mild dilatation of the aortic root, measuring 43 mm. There is mild to moderate dilatation of the ascending aorta, measuring 43 mm.  8. The inferior vena cava is dilated in size with >50% respiratory variability, suggesting right atrial pressure of 8 mmHg. Comparison(s): Compared to prior echo on 09/17/20, there is now a large pericardial effusion present. Findings discussed with Dr. BHarl Bowie FINDINGS  Left Ventricle: Left ventricular ejection fraction, by estimation, is 60 to 65%. The left ventricle has normal function. The left ventricle has no regional wall motion abnormalities. The left ventricular internal cavity size was normal in size.  There  is  mild concentric left ventricular hypertrophy. Left ventricular diastolic parameters are consistent with Grade I diastolic dysfunction (impaired relaxation). Right Ventricle: The right ventricular size is normal. No increase in right ventricular wall thickness. Right ventricular systolic function is normal. There is moderately elevated pulmonary artery systolic pressure. The tricuspid regurgitant velocity is 3.07 m/s, and with an assumed right atrial pressure of 8 mmHg, the estimated right ventricular systolic pressure is 71.6 mmHg. Left Atrium: Left atrial size was normal in size. Right Atrium: Right atrial size was normal in size. Pericardium: There is a large pericardial effusion present measuring 2.6cm at end diastole. Appears to be largest at the LV apex. The is about 25% respiratory variation across the mitral valve. IVC is dilated but collapses >50% with inspiration suggesting RAP 26mHg. There is no RV/RA diastolic collapse. Overall, no tamponade physiology present. Mitral Valve: The mitral valve is normal in structure. There is mild thickening of the mitral valve leaflet(s). Mild mitral valve regurgitation. Tricuspid Valve: The tricuspid valve is normal in structure. Tricuspid valve regurgitation is moderate. Aortic Valve: The aortic valve is tricuspid. There is mild calcification of the aortic valve. There is mild thickening of the aortic valve. Aortic valve regurgitation is mild. Mild aortic valve sclerosis is present, with no evidence of aortic valve stenosis. Pulmonic Valve: The pulmonic valve was normal in structure. Pulmonic valve regurgitation is trivial. Aorta: Aortic dilatation noted. There is mild dilatation of the aortic root, measuring 43 mm. There is mild to moderate dilatation of the ascending aorta, measuring 43 mm. Venous: The inferior vena cava is dilated in size with greater than 50% respiratory variability, suggesting right atrial pressure of 8 mmHg. IAS/Shunts: No atrial level  shunt detected by color flow Doppler. Additional Comments: A pacer wire is visualized.  LEFT VENTRICLE PLAX 2D LVIDd:         3.80 cm  Diastology LVIDs:         2.40 cm  LV e' medial:    5.11 cm/s LV PW:         1.20 cm  LV E/e' medial:  13.2 LV IVS:        1.30 cm  LV e' lateral:   3.59 cm/s LVOT diam:     2.30 cm  LV E/e' lateral: 18.7 LV SV:         95 LV SV Index:   48 LVOT Area:     4.15 cm  RIGHT VENTRICLE RV S prime:     11.10 cm/s TAPSE (M-mode): 2.0 cm LEFT ATRIUM             Index       RIGHT ATRIUM           Index LA diam:        3.70 cm 1.87 cm/m  RA Area:     18.10 cm LA Vol (A2C):   59.9 ml 30.34 ml/m RA Volume:   50.60 ml  25.63 ml/m LA Vol (A4C):   49.8 ml 25.23 ml/m LA Biplane Vol: 55.9 ml 28.32 ml/m  AORTIC VALVE LVOT Vmax:   128.00 cm/s LVOT Vmean:  92.000 cm/s LVOT VTI:    0.229 m  AORTA Ao Root diam: 4.30 cm Ao Asc diam:  4.30 cm MITRAL VALVE               TRICUSPID VALVE MV Area (PHT): 1.69 cm    TR Peak grad:   37.7 mmHg MV Decel Time: 450 msec    TR  Vmax:        307.00 cm/s MV E velocity: 67.30 cm/s MV A velocity: 84.80 cm/s  SHUNTS MV E/A ratio:  0.79        Systemic VTI:  0.23 m                            Systemic Diam: 2.30 cm Gwyndolyn Kaufman MD Electronically signed by Gwyndolyn Kaufman MD Signature Date/Time: 11/16/2020/2:02:20 PM    Final     Cardiac Studies   11/16/20 echo IMPRESSIONS    1. There is a large pericardial effusion present measuring 2.6cm at end  diastole. Appears to be largest at the LV apex. The is about 25%  respiratory variation across the mitral valve. IVC is dilated but  collapses >50% with inspiration suggesting RAP  10mHg. There is no RV/RA diastolic collapse. Overall, no tamponade  physiology present.  2. Left ventricular ejection fraction, by estimation, is 60 to 65%. The  left ventricle has normal function. The left ventricle has no regional  wall motion abnormalities. There is mild concentric left ventricular  hypertrophy. Left  ventricular diastolic  parameters are consistent with Grade I diastolic dysfunction (impaired  relaxation).  3. Right ventricular systolic function is normal. The right ventricular  size is normal. There is moderately elevated pulmonary artery systolic  pressure.  4. The mitral valve is normal in structure. Mild mitral valve  regurgitation.  5. Tricuspid valve regurgitation is moderate.  6. The aortic valve is tricuspid. There is mild calcification of the  aortic valve. There is mild thickening of the aortic valve. Aortic valve  regurgitation is mild. Mild aortic valve sclerosis is present, with no  evidence of aortic valve stenosis.  7. Aortic dilatation noted. There is mild dilatation of the aortic root,  measuring 43 mm. There is mild to moderate dilatation of the ascending  aorta, measuring 43 mm.  8. The inferior vena cava is dilated in size with >50% respiratory  variability, suggesting right atrial pressure of 8 mmHg.   Patient Profile     74y.o.Caucasianmalewith hypertension, hyperlipidemia,prostate enlargement,h/o retinal vein occlusion,s/p dual chamber pacemaker for pacemaker for syncope (09/2020), paroxysmal Afib-now on flecainide, admitted with shortness of breath.  Assessment & Plan    1. Acute diastolic heart failure - new clinical diagnosis, presented with volume overload. From report 21 lbs weight gain at home.  - BNP 481, CXR patchy opacities. , moderate left and trace right effusion - 08/2020 echo LVEF 65-70%, indet DDx, normal RV, mod to severe TR, mild MR - 10/2020 echo: LVEF 60-65%, no WMAs, grade I DDx, normal RV dysfunction, mild MR, mod TR. Large pericardial effusion   - negative 3.2 L yesterday, neg 5.2 L since admission. He is on IV lasix 433mbid, slight uptrend in Cr but still WNL.  - symptoms significantly imrpoving with diuresis. May be able to transition to oral diuretic tomorrow.     2. Pericardial effusion - 10/2020 echo large  effusion, 2.6 cm in diastole largest at LV apex. Borderlien resp variation 25% across MV, dilated IVC but collapses, no RA/RV collapse. Overall no tamponade by echo, he is not in clinical tamponade - TSH 1.7 ESR 70 HSCRP pending  - repeat limited echo tomorrow - I have held his xarelto in case a pericardiocentesis becomes neccesary.  - with elevated ESR perhaps inflammatory effusion, though denies any recent viral symptoms. Pending clinical course may require tap which would add further  diagnostic information, possible CT chest for underlying malignancy.    2. Hyponatremia - presentation consistent with hypervolemic hyponatreemia, improving with diuresis  3. PAF - new diagnosis 11/08/20, started on flecanide - in setting of clinical HF flecanide stopped on admission - has not had significant afib on tele this admission, if occurs would just rate control for now. In absence of afib during admission and with large effusion and uncertain future hemodynamics have not started av nodal agent as of yet.    4. History of complete heart block - has PPM followed by EP  For questions or updates, please contact [place] Please consult [email or web address] for contact info under        Signed, Carlyle Dolly, MD  11/17/2020, 8:41 AM

## 2020-11-17 NOTE — Progress Notes (Signed)
NPPV device brought per patient and RN request and MD order.

## 2020-11-17 NOTE — Progress Notes (Signed)
Spoke with pt regarding CPAP. Pt states he did not use CPAP last night, and is not interested in using while in the hospital. CPAP machine removed from pt's room at this time, and order discontinued per RT protocol. Advised pt that if he changes his mind about trying CPAP to notify for RT and we would bring up another machine.

## 2020-11-18 ENCOUNTER — Inpatient Hospital Stay (HOSPITAL_COMMUNITY): Payer: Medicare Other

## 2020-11-18 DIAGNOSIS — I313 Pericardial effusion (noninflammatory): Secondary | ICD-10-CM

## 2020-11-18 LAB — BASIC METABOLIC PANEL
Anion gap: 9 (ref 5–15)
Anion gap: 13 (ref 5–15)
BUN: 17 mg/dL (ref 8–23)
BUN: 20 mg/dL (ref 8–23)
CO2: 27 mmol/L (ref 22–32)
CO2: 26 mmol/L (ref 22–32)
Calcium: 8 mg/dL — ABNORMAL LOW (ref 8.9–10.3)
Calcium: 8.2 mg/dL — ABNORMAL LOW (ref 8.9–10.3)
Chloride: 92 mmol/L — ABNORMAL LOW (ref 98–111)
Chloride: 88 mmol/L — ABNORMAL LOW (ref 98–111)
Creatinine, Ser: 0.9 mg/dL (ref 0.61–1.24)
Creatinine, Ser: 0.84 mg/dL (ref 0.61–1.24)
GFR, Estimated: >60 mL/min (ref >60)
GFR, Estimated: >60 mL/min (ref >60)
Glucose, Bld: 115 mg/dL — ABNORMAL HIGH (ref 70–99)
Glucose, Bld: 112 mg/dL — ABNORMAL HIGH (ref 70–99)
Potassium: 4 mmol/L (ref 3.5–5.1)
Potassium: 4 mmol/L (ref 3.5–5.1)
Sodium: 128 mmol/L — ABNORMAL LOW (ref 135–145)
Sodium: 127 mmol/L — ABNORMAL LOW (ref 135–145)

## 2020-11-18 LAB — ECHOCARDIOGRAM LIMITED
Height: 67 in
Weight: 2752 oz

## 2020-11-18 LAB — HIGH SENSITIVITY CRP: CRP, High Sensitivity: 172.89 mg/L — ABNORMAL HIGH (ref 0.00–3.00)

## 2020-11-18 MED ORDER — IBUPROFEN 600 MG PO TABS
600.0000 mg | ORAL_TABLET | Freq: Three times a day (TID) | ORAL | Status: DC
  Administered 2020-11-18 – 2020-11-24 (×18): 600 mg via ORAL
  Filled 2020-11-18 (×18): qty 1

## 2020-11-18 MED ORDER — COLCHICINE 0.6 MG PO TABS
0.6000 mg | ORAL_TABLET | Freq: Two times a day (BID) | ORAL | Status: DC
  Administered 2020-11-18 – 2020-11-24 (×12): 0.6 mg via ORAL
  Filled 2020-11-18 (×12): qty 1

## 2020-11-18 MED ORDER — RIVAROXABAN 20 MG PO TABS
20.0000 mg | ORAL_TABLET | Freq: Every day | ORAL | Status: DC
  Administered 2020-11-18 – 2020-11-20 (×3): 20 mg via ORAL
  Filled 2020-11-18 (×3): qty 1

## 2020-11-18 NOTE — Progress Notes (Addendum)
Progress Note  Patient Name: Ricky Hood Date of Encounter: 11/18/2020  [facility] Cardiologist: Dr. Tomie China Dr. Johney Frame  Subjective   No acute overnight events. He had one episode of shortness of breath last night when nasal cannula fell off. Otherwise, breathing OK. Still on 1L of O2 (was not on any supplemental O2 at home). No chest pain. Lower extremity edema has improved. Weight still up 8-9 lbs from baseline.  Inpatient Medications    Scheduled Meds: . furosemide  40 mg Intravenous BID  . lisinopril  10 mg Oral QHS  . mouth rinse  15 mL Mouth Rinse BID  . nitroGLYCERIN  0.4 mg Transdermal Daily  . rosuvastatin  5 mg Oral QHS  . sodium chloride flush  3 mL Intravenous Q12H   Continuous Infusions: . sodium chloride     PRN Meds: sodium chloride, acetaminophen, ondansetron (ZOFRAN) IV, psyllium, sodium chloride flush   Vital Signs    Vitals:   11/17/20 1727 11/17/20 2008 11/17/20 2024 11/18/20 0550  BP: (!) 147/77 138/77 138/77 (!) 149/81  Pulse: 88 88 88 75  Resp: 18 (!) 26 17 (!) 23  Temp:  98.3 F (36.8 C) 98.3 F (36.8 C) 98.4 F (36.9 C)  TempSrc: Oral Oral  Oral  SpO2:  96% 96% 95%  Weight:    78 kg  Height:        Intake/Output Summary (Last 24 hours) at 11/18/2020 0719 Last data filed at 11/18/2020 0600 Gross per 24 hour  Intake 600 ml  Output 3900 ml  Net -3300 ml   Last 3 Weights 11/18/2020 11/17/2020 11/15/2020  Weight (lbs) 172 lb 173 lb 4.8 oz 189 lb  Weight (kg) 78.019 kg 78.608 kg 85.73 kg      Telemetry    Ventricular paced rhythm with underlying sinus rhythm. Occasional PVCs. - Personally Reviewed  ECG    No new ECG tracing today. - Personally Reviewed  Physical Exam   GEN: No acute distress.   Neck: Supple. No JVD Cardiac: RRR with occasional ectopy. No murmurs, rubs, or gallops. Radial pulses and distal pedal pulses 2+ and equal bilaterally. Respiratory: No increased work of breathing. Deminished/absent breath  sounds in bilateral bases (left > right). No crackles.  GI: Soft, non-distended, and non-tender. Bowel sounds present. MS: No lower extremity edema. No deformity. Skin: Warm and dry. Neuro:  Non-focal deficits. Psych: Normal affect. Responds appropriately.  Labs    High Sensitivity Troponin:   Recent Labs  Lab 11/15/20 1639  TROPONINIHS 18*      Chemistry Recent Labs  Lab 11/15/20 1639 11/16/20 0159 11/17/20 0121 11/18/20 0333  NA  --  134* 134* 128*  K  --  4.1 4.0 4.0  CL  --  95* 94* 92*  CO2  --  28 28 27   GLUCOSE  --  105* 120* 115*  BUN  --  14 19 17   CREATININE  --  0.94 1.07 0.90  CALCIUM  --  8.3* 8.2* 8.0*  PROT 6.0*  --   --   --   ALBUMIN 2.3*  --   --   --   AST 22  --   --   --   ALT 48*  --   --   --   ALKPHOS 122  --   --   --   BILITOT 1.1  --   --   --   GFRNONAA  --  >60 >60 >60  ANIONGAP  --  11  12 9     Hematology Recent Labs  Lab 11/15/20 0953 11/17/20 0121  WBC 8.8 10.7*  RBC 3.86* 3.94*  HGB 12.0* 11.6*  HCT 35.6* 35.2*  MCV 92.2 89.3  MCH 31.1 29.4  MCHC 33.7 33.0  RDW 12.7 12.6  PLT 479* 478*    BNP Recent Labs  Lab 11/16/20 0159  BNP 481.4*     DDimer No results for input(s): DDIMER in the last 168 hours.   Radiology    ECHOCARDIOGRAM COMPLETE  Result Date: 11/16/2020    ECHOCARDIOGRAM REPORT   Patient Name:   Ricky Hood Date of Exam: 11/16/2020 Medical Rec #:  [id number]      Height:       67.0 in Accession #:    [id number]     Weight:       189.0 lb Date of Birth:  25-Jan-1946      BSA:          1.974 m Patient Age:    74 years       BP:           134/64 mmHg Patient Gender: M              HR:           67 bpm. Exam Location:  Inpatient Procedure: 2D Echo, Color Doppler and Cardiac Doppler Indications:    I50.31 Acute diastolic (congestive) heart failure  History:        Patient has prior history of Echocardiogram examinations, most                 recent 09/17/2020. Pacemaker; Risk Factors:Hypertension and                  Dyslipidemia. Pacemaker placed 08/2020.  Sonographer:    Irving BurtonEmily Senior RDCS Referring Phys: [id number] [facility] J PATWARDHAN IMPRESSIONS  1. There is a large pericardial effusion present measuring 2.6cm at end diastole. Appears to be largest at the LV apex. The is about 25% respiratory variation across the mitral valve. IVC is dilated but collapses >50% with inspiration suggesting RAP 8mmHg. There is no RV/RA diastolic collapse. Overall, no tamponade physiology present.  2. Left ventricular ejection fraction, by estimation, is 60 to 65%. The left ventricle has normal function. The left ventricle has no regional wall motion abnormalities. There is mild concentric left ventricular hypertrophy. Left ventricular diastolic parameters are consistent with Grade I diastolic dysfunction (impaired relaxation).  3. Right ventricular systolic function is normal. The right ventricular size is normal. There is moderately elevated pulmonary artery systolic pressure.  4. The mitral valve is normal in structure. Mild mitral valve regurgitation.  5. Tricuspid valve regurgitation is moderate.  6. The aortic valve is tricuspid. There is mild calcification of the aortic valve. There is mild thickening of the aortic valve. Aortic valve regurgitation is mild. Mild aortic valve sclerosis is present, with no evidence of aortic valve stenosis.  7. Aortic dilatation noted. There is mild dilatation of the aortic root, measuring 43 mm. There is mild to moderate dilatation of the ascending aorta, measuring 43 mm.  8. The inferior vena cava is dilated in size with >50% respiratory variability, suggesting right atrial pressure of 8 mmHg. Comparison(s): Compared to prior echo on 09/17/20, there is now a large pericardial effusion present. Findings discussed with Dr. Wyline MoodBranch. FINDINGS  Left Ventricle: Left ventricular ejection fraction, by estimation, is 60 to 65%. The left ventricle has normal function. The left ventricle has no regional  wall  motion abnormalities. The left ventricular internal cavity size was normal in size. There is  mild concentric left ventricular hypertrophy. Left ventricular diastolic parameters are consistent with Grade I diastolic dysfunction (impaired relaxation). Right Ventricle: The right ventricular size is normal. No increase in right ventricular wall thickness. Right ventricular systolic function is normal. There is moderately elevated pulmonary artery systolic pressure. The tricuspid regurgitant velocity is 3.07 m/s, and with an assumed right atrial pressure of 8 mmHg, the estimated right ventricular systolic pressure is 45.7 mmHg. Left Atrium: Left atrial size was normal in size. Right Atrium: Right atrial size was normal in size. Pericardium: There is a large pericardial effusion present measuring 2.6cm at end diastole. Appears to be largest at the LV apex. The is about 25% respiratory variation across the mitral valve. IVC is dilated but collapses >50% with inspiration suggesting RAP . There is no RV/RA diastolic collapse. Overall, no tamponade physiology present. Mitral Valve: The mitral valve is normal in structure. There is mild thickening of the mitral valve leaflet(s). Mild mitral valve regurgitation. Tricuspid Valve: The tricuspid valve is normal in structure. Tricuspid valve regurgitation is moderate. Aortic Valve: The aortic valve is tricuspid. There is mild calcification of the aortic valve. There is mild thickening of the aortic valve. Aortic valve regurgitation is mild. Mild aortic valve sclerosis is present, with no evidence of aortic valve stenosis. Pulmonic Valve: The pulmonic valve was normal in structure. Pulmonic valve regurgitation is trivial. Aorta: Aortic dilatation noted. There is mild dilatation of the aortic root, measuring 43 mm. There is mild to moderate dilatation of the ascending aorta, measuring 43 mm. Venous: The inferior vena cava is dilated in size with greater than 50% respiratory  variability, suggesting right atrial pressure of 8 mmHg. IAS/Shunts: No atrial level shunt detected by color flow Doppler. Additional Comments: A pacer wire is visualized.  LEFT VENTRICLE PLAX 2D LVIDd:         3.80 cm  Diastology LVIDs:         2.40 cm  LV e' medial:    5.11 cm/s LV PW:         1.20 cm  LV E/e' medial:  13.2 LV IVS:        1.30 cm  LV e' lateral:   3.59 cm/s LVOT diam:     2.30 cm  LV E/e' lateral: 18.7 LV SV:         95 LV SV Index:   48 LVOT Area:     4.15 cm  RIGHT VENTRICLE RV S prime:     11.10 cm/s TAPSE (M-mode): 2.0 cm LEFT ATRIUM             Index       RIGHT ATRIUM           Index LA diam:        3.70 cm 1.87 cm/m  RA Area:     18.10 cm LA Vol (A2C):   59.9 ml 30.34 ml/m RA Volume:   50.60 ml  25.63 ml/m LA Vol (A4C):   49.8 ml 25.23 ml/m LA Biplane Vol: 55.9 ml 28.32 ml/m  AORTIC VALVE LVOT Vmax:   128.00 cm/s LVOT Vmean:  92.000 cm/s LVOT VTI:    0.229 m  AORTA Ao Root diam: 4.30 cm Ao Asc diam:  4.30 cm MITRAL VALVE               TRICUSPID VALVE MV Area (PHT): 1.69 cm    TR  Peak grad:   37.7 mmHg MV Decel Time: 450 msec    TR Vmax:        307.00 cm/s MV E velocity: 67.30 cm/s MV A velocity: 84.80 cm/s  SHUNTS MV E/A ratio:  0.79        Systemic VTI:  0.23 m                            Systemic Diam: 2.30 cm Laurance Flatten MD Electronically signed by Laurance Flatten MD Signature Date/Time: 11/16/2020/2:02:20 PM    Final     Cardiac Studies   Echocardiogram 11/16/2020: Impressions: 1. There is a large pericardial effusion present measuring 2.6cm at end  diastole. Appears to be largest at the LV apex. The is about 25%  respiratory variation across the mitral valve. IVC is dilated but  collapses >50% with inspiration suggesting RAP  . There is no RV/RA diastolic collapse. Overall, no tamponade  physiology present.  2. Left ventricular ejection fraction, by estimation, is 60 to 65%. The  left ventricle has normal function. The left ventricle has no regional   wall motion abnormalities. There is mild concentric left ventricular  hypertrophy. Left ventricular diastolic  parameters are consistent with Grade I diastolic dysfunction (impaired  relaxation).  3. Right ventricular systolic function is normal. The right ventricular  size is normal. There is moderately elevated pulmonary artery systolic  pressure.  4. The mitral valve is normal in structure. Mild mitral valve  regurgitation.  5. Tricuspid valve regurgitation is moderate.  6. The aortic valve is tricuspid. There is mild calcification of the  aortic valve. There is mild thickening of the aortic valve. Aortic valve  regurgitation is mild. Mild aortic valve sclerosis is present, with no  evidence of aortic valve stenosis.  7. Aortic dilatation noted. There is mild dilatation of the aortic root,  measuring 43 mm. There is mild to moderate dilatation of the ascending  aorta, measuring 43 mm.  8. The inferior vena cava is dilated in size with >50% respiratory  variability, suggesting right atrial pressure of 8 mmHg.   Comparison(s): Compared to prior echo on 09/17/20, there is now a large  pericardial effusion present. Findings discussed with Dr. Wyline Mood.   Patient Profile     74 y.o. 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  Assessment & Plan    New Onset Acute Diastolic CHF - Presented with shortness of breath and 21lb weight gain.  - BNP elevated at 481. - Chest x-ray showed patchy bilateral pulmonary opacities and new moderate left and trace right pleural effusions. - Echo showed LVEF of 60-65% with normal wall motion, grade 1 diastolic dysfunction, mild MR,  moderate TR, moderately elevated PASP as well as a new large pericardial effusion. - Currently on IV Lasix  twice daily. Documented urinary output of 3.9 L in the past 24 hours and net negative 8.5 L since admission. Weight 172 lbs today, down 17 lbs from admission. Renal function normal.  - Continue current dose of Lasix. - Continue to monitor daily weights, strict I/O's, and renal function.  Pericardial Effusion - Echo this admission showed large pericardial effusion measuring 2.6cm at end diastole. Largest at the  LV apex. No evidence of tamponade. This was new from Echo in 08/2020. - Patient is hemodynamically stable. - TSH normal. - Sed rate elevated at 70. High sensitivity CRP pending.  - Plan is for repeat Echo today. If effusion has not improved, may need pericardiocentesis.   Paroxysmal Atrial Fibrillation - Diagnosed on 11/08/2020 and started on Flecainide which has now been stopped in setting of CHF.  - Telemetry shows ventricular paced rhythm with underlying sinus rhythm. - Xarelto held yesterday for possible need of pericardiocentesis.   Complete Heart Block s/p PPM - Followed by Dr. Johney Frame.  Hypertension - BP mildly elevated. - Continue home Lisinopril 10mg  daily and diuresis.   Hyperlipidemia - Continue home Crestor.  Hyponatremia - Sodium 131 on admission. Initially improved with diuresis but today 128 (wonder if this is accurate given weight still up from baseline). - Can repeat BMET later today.  For questions or updates, please contact [facility] Please consult [email or web address] for contact info under        Signed, , PA-C  11/18/2020, 7:19 AM    Attending Note:   The patient was seen and examined.  Agree with assessment and plan as noted above.  Changes made to the above note as needed.  Patient seen and independently examined with 11/20/2020, PA .   We discussed all aspects of the encounter. I agree with the assessment and plan  as stated above.  1.   Pericardial effusion.  Has a large pericardial effusion.  No evidence of pericardial tamponade  Repeat echo today . xarelto is on hold for possible pericardiocentesis if needed.  Appears to be stable at present   2. Hyponatremia:   He drinks 1 gallon of water daily.  Advised him to cut back to 8 cups ( 64 oz daily )     I have spent a total of 40 minutes with patient reviewing hospital  notes , telemetry, EKGs, labs and examining patient as well as establishing an assessment and plan that was discussed with the patient. > 50% of time was spent in direct patient care.    Virl Son, Vesta Mixer., MD, [facility] 11/18/2020, 11:18 AM [address]  [address] Office [phone] Pager [phone]

## 2020-11-18 NOTE — Progress Notes (Signed)
   Repeat Echo showed improvement in pericardial effusion (now small to moderate). Repeat BMET revealed Na of 127. Patient reported drinking about a galloon of water per day. He was advised to limit this. Will hold IV Lasix for now and repeat BMET tomorrow morning. Can likely restart IV Lasix at 40mg  once daily. Will restart Xarelto as there does not appear to be a need for pericardiocentesis.   , PA-C 11/18/2020 5:54 PM

## 2020-11-18 NOTE — Progress Notes (Signed)
  Echocardiogram 2D Echocardiogram has been performed.  Ricky Hood 11/18/2020, 2:23 PM

## 2020-11-18 NOTE — Plan of Care (Signed)
  Problem: Education: Goal: Ability to demonstrate management of disease process will improve Outcome: Progressing Goal: Ability to verbalize understanding of medication therapies will improve Outcome: Progressing   Problem: Activity: Goal: Capacity to carry out activities will improve Outcome: Progressing   Problem: Cardiac: Goal: Ability to achieve and maintain adequate cardiopulmonary perfusion will improve Outcome: Progressing   Problem: Education: Goal: Knowledge of General Education information will improve Description: Including pain rating scale, medication(s)/side effects and non-pharmacologic comfort measures Outcome: Progressing   Problem: Health Behavior/Discharge Planning: Goal: Ability to manage health-related needs will improve Outcome: Progressing   Problem: Clinical Measurements: Goal: Ability to maintain clinical measurements within normal limits will improve Outcome: Progressing   Problem: Activity: Goal: Risk for activity intolerance will decrease Outcome: Progressing   Problem: Nutrition: Goal: Adequate nutrition will be maintained Outcome: Progressing   Problem: Coping: Goal: Level of anxiety will decrease Outcome: Progressing   Problem: Pain Managment: Goal: General experience of comfort will improve Outcome: Progressing   Problem: Safety: Goal: Ability to remain free from injury will improve Outcome: Progressing

## 2020-11-19 DIAGNOSIS — I313 Pericardial effusion (noninflammatory): Secondary | ICD-10-CM

## 2020-11-19 DIAGNOSIS — E871 Hypo-osmolality and hyponatremia: Secondary | ICD-10-CM

## 2020-11-19 LAB — BASIC METABOLIC PANEL
Anion gap: 10 (ref 5–15)
BUN: 17 mg/dL (ref 8–23)
CO2: 27 mmol/L (ref 22–32)
Calcium: 8.2 mg/dL — ABNORMAL LOW (ref 8.9–10.3)
Chloride: 90 mmol/L — ABNORMAL LOW (ref 98–111)
Creatinine, Ser: 0.81 mg/dL (ref 0.61–1.24)
GFR, Estimated: >60 mL/min (ref >60)
Glucose, Bld: 110 mg/dL — ABNORMAL HIGH (ref 70–99)
Potassium: 4.1 mmol/L (ref 3.5–5.1)
Sodium: 127 mmol/L — ABNORMAL LOW (ref 135–145)

## 2020-11-19 LAB — OSMOLALITY: Osmolality: 271 mOsm/kg — ABNORMAL LOW (ref 275–295)

## 2020-11-19 LAB — SODIUM, URINE, RANDOM: Sodium, Ur: 110 mmol/L

## 2020-11-19 LAB — HIV ANTIBODY (ROUTINE TESTING W REFLEX): HIV Screen 4th Generation wRfx: NONREACTIVE

## 2020-11-19 LAB — OSMOLALITY, URINE: Osmolality, Ur: 586 mOsm/kg (ref 300–900)

## 2020-11-19 MED ORDER — FUROSEMIDE 10 MG/ML IJ SOLN: 40.0000 mg | Freq: Every day | INTRAMUSCULAR | Status: DC

## 2020-11-19 NOTE — Care Management Important Message (Signed)
Important Message  Patient Details  Name: Ricky Hood MRN: [id number] Date of Birth: 07/21/1946   Medicare Important Message Given:  Yes     Renie Ora 11/19/2020, 12:41 PM

## 2020-11-19 NOTE — Plan of Care (Signed)
  Problem: Education: Goal: Ability to demonstrate management of disease process will improve Outcome: Progressing Goal: Ability to verbalize understanding of medication therapies will improve Outcome: Progressing Goal: Individualized Educational Video(s) Outcome: Progressing   Problem: Education: Goal: Knowledge of General Education information will improve Description: Including pain rating scale, medication(s)/side effects and non-pharmacologic comfort measures Outcome: Progressing   Problem: Health Behavior/Discharge Planning: Goal: Ability to manage health-related needs will improve Outcome: Progressing

## 2020-11-19 NOTE — Consult Note (Addendum)
[facility] Medical Consultation  Nashua Homewood GYF:[id number] DOB: 26-Nov-1946 DOA: 11/15/2020 PCP: Marlyn Corporal, PA   Requesting physician: Kristeen Miss, MD Date of consultation: 11/19/2020 Reason for consultation: Work-up of pericardial effusion and hyponatremia  Impression/Recommendations Active Problems:   Acute diastolic heart failure (HCC)    1. Diastolic heart failure: Acute.  Patient presented with complaints of shortness of breath.  Chest x-ray showed patchy bilateral infiltrates concerning for edema with bilateral pleural effusions.  And BNP elevated at 481.4.  Echocardiogram had revealed EF 60-65% with large pericardial effusion present without signs of cardiac tamponade.  With IV diuretics patient had improvement in lower extremity swelling and weight trended back to near patient's reported baseline.  Furosemide 40 mg IV last given yesterday morning. -Per cardiology   2. Pericardial effusion: Initially seen to have a large pericardial effusion without cardiac tamponade features on echocardiogram.  Labs revealing sedimentation rate 70 and CRP 172.89 .  Patient was started on colchicine and ibuprofen.  Repeat echocardiogram from 12/20 noting reduction in pericardial effusion size for which pericardiocentesis was not needed.  Thyroid seems less likely a cause as TSH 1.706 on 12/18.  Question the possibility of a rheumatologic cause of patient's symptoms given patient's significant family history of rheumatoid arthritis in parents and child.   -Continue colchicine and ibuprofen protocol -Check rheumatoid factor, complement levels, and antinuclear antibody test to start initial work-up. -May need to discuss results and set patient up with referral to rheumatology in outpatient setting  3. Hyponatremia: Acute.  Sodium levels since admission 131->134->134-> 128->127->127.   Since patient's initial sodium levels improved with IV diuretics but suspect that hyponatremia was  initially secondary to hypervolemia.  However, now that patient appears to be more euvolemic would suspect overdiuresis as a cause. Advil and lisinopril have been associated with hyponatremia.  -Check urine sodium(110), urine osmolarity, and serum osmolarity(271) -Would hold IV Lasix and fluid restrict <1.5 L/day based off current available lab work as there may also be a component of SIADH as patient has had low sodium levels previously in the past. -Continue to monitor sodium levels -Recheck urine sodium and urine osmolality in am after patient meets been 24 hours of diuretics  4. Complete heart block s/p PM  5. Paroxysmal atrial fibrillation on anticoagulation:CHA2DS2-VASc score =2.  Patient had recently been started on flecainide and Xarelto on 12/10 by Dr. Johney Frame.  Flecainide had been stopped after patient had been admitted into the hospital.  Xarelto had been initially held for possible need of pleurodesis, but was restarted yesterday after improved pericardial effusion. -Continue Xarelto per cardiology  6. Hypoalbuminemia: Acute.  On admission patient's albumin levels were noted to be low 2.3. -Check prealbumin in a.m.   I will followup again tomorrow. Please contact me if I can be of assistance in the meanwhile. Thank you for this consultation.  Chief Complaint: Shortness of breath  HPI:  Ricky Hood is a 74 y.o. 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  He had recently been started on flecainide and Xarelto by Dr. Johney Frame on 11/08/2020 during a follow-up visit after interrogation of pacemaker noted episodes of atrial fibrillation.  His family noticed that he began looking puffier and reportedly had a 23 pound weight gain.  He follow-up in atrial fibrillation clinic and was noted to  be short of breath and had been sent to  the hospital for further evaluation.  X-ray imaging revealed pulmonary edema and BNP was elevated at 481.4.  He was admitted to the cardiology service and started on IV diuresis. Echocardiogram 12/18 revealed a large pericardial effusion without tamponade features.  He was started on ibuprofen and colchicine.  Since admission sodium levels had trended from 131 to 134, and then back down to 127.  Since being in the hospital is leg swelling has resolved and he feels like he is back to his normal weight.  Repeat echocardiogram from 12/20 showed reduced pericardial effusion.  Patient reports significant family history of rheumatoid arthritis in both of his parents as well as a son with juvenile rheumatoid arthritis.  He personally had never been diagnosed with rheumatoid arthritis.  He intermittently has some mild back pain.  Denies having any significant joint pains, skin changes, nodules, fevers, chills, falls, trauma history of TB, or recent sick contacts.  [place] lives [place] a residential neighborhood.  Patient also reports that he hikes, but the last take that he ever had to remove it was very small in over 3 years ago.  Patient reports that he had received his booster for COVID-19 on October 29.  He also reported prior history of having low sodium levels in the past for which he was advised by his primary care provider to increase his salt intake.   Review of Systems: Review of Systems  Constitutional: Negative for chills and fever.  Eyes: Negative for photophobia and pain.  Gastrointestinal: Negative for abdominal pain, nausea and vomiting.  Musculoskeletal: Positive for back pain. Negative for joint pain.     Past Medical History:  Diagnosis Date  . Hypercholesteremia   . Hypertension   . Paroxysmal atrial fibrillation (HCC)   . 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 Second degree AV block    Past  Surgical History:  Procedure Laterality Date  . BIOPSY PROSTATE    . COLONOSCOPY    . PACEMAKER IMPLANT N/A 09/17/2020   SJM Assurity DR implanted by Dr Johney FrameAllred for AV block and syncope   Social History:  reports that he has never smoked. He has never used smokeless tobacco. He reports that he does not drink alcohol and does not use drugs.  No Known Allergies Family History  Problem Relation Age of Onset  . Hypertension Mother   . Hyperlipidemia Mother   . Other Mother        Carotid endarterectomy  . Hypertension Father   . Hyperlipidemia Father     Prior to Admission medications   Medication Sig Start Date End Date Taking? Authorizing Provider  acetaminophen (TYLENOL) 325 MG tablet Take 325-650 mg by mouth daily as needed for headache (pain).   Yes [provider]  ascorbic acid (VITAMIN C) 500 MG tablet Take 500 mg by mouth daily after supper.   Yes [provider]  Cholecalciferol (VITAMIN D3) 50 MCG (2000 UT) TABS Take 2,000 Units by mouth daily after supper.   Yes [provider]  Coenzyme Q10 (COQ10) 200 MG CAPS Take 200 mg by mouth daily after supper.   Yes [provider]  finasteride (PROSCAR) 5 MG tablet Take 5 mg by mouth daily after supper.   Yes [provider]  fish oil-omega-3 fatty acids 1000 MG capsule Take 3 g by mouth daily after supper.   Yes [provider]  flecainide (TAMBOCOR) 100 MG tablet Take 1 tablet (100  mg total) by mouth 2 (two) times daily. Patient taking differently: Take 100 mg by mouth 2 (two) times daily after a meal. 11/08/20  Yes Allred, Fayrene Fearing, MD  lisinopril (ZESTRIL) 10 MG tablet Take 10 mg by mouth daily after supper.   Yes [provider]  loratadine (CLARITIN) 10 MG tablet Take 10 mg by mouth daily after supper.   Yes [provider]  Multiple Vitamin (MULTIVITAMIN WITH MINERALS) TABS tablet Take 1 tablet by mouth daily after supper.   Yes [provider]   Psyllium (METAMUCIL FIBER PO) Take by mouth See admin instructions. Mix 1 heaping teaspoonful of powder into 6-8 ounces of juice and drink in the morning and before supper   Yes [provider]  rivaroxaban (XARELTO) 20 MG TABS tablet Take 1 tablet (20 mg total) by mouth daily with supper. 11/08/20  Yes Allred, Fayrene Fearing, MD  rosuvastatin (CRESTOR) 5 MG tablet Take 5 mg by mouth daily after supper. 07/26/20  Yes [provider]  TART CHERRY PO Take 30 mLs by mouth 2 (two) times daily with a meal.   Yes [provider]   Physical Exam:  Constitutional: Elderly male currently in no acute distress Vitals:   11/18/20 2324 11/19/20 0500 11/19/20 0523 11/19/20 0709  BP: 133/79  (!) 141/80 128/77  Pulse: 72  68 70  Resp: 20  19 17   Temp: 98.9 F (37.2 C)  97.9 F (36.6 C) 98 F (36.7 C)  TempSrc: Oral  Oral Oral  SpO2: 97%  94% 97%  Weight:  76.2 kg    Height:       Eyes: PERRL, lids and conjunctivae normal ENMT: Mucous membranes are moist. Posterior pharynx clear of any exudate or lesions.  Neck: normal, supple, no significant JVD Respiratory: clear to auscultation bilaterally, no wheezing, no crackles. Normal respiratory effort. No accessory muscle use.  Cardiovascular: Regular rate and rhythm. No extremity edema. 2+ pedal pulses. No carotid bruits.  Abdomen: no tenderness, no masses palpated. No hepatosplenomegaly. Bowel sounds positive.  Musculoskeletal: no clubbing / cyanosis. No joint deformity upper and lower extremities. Good ROM, no contractures. Normal muscle tone.  Skin: no rashes, lesions, ulcers. No induration Neurologic: CN 2-12 grossly intact. Sensation intact, DTR normal. Strength 5/5 in all 4.  Psychiatric: Normal judgment and insight. Alert and oriented x 3. Normal mood.   Labs on Admission:  Basic Metabolic Panel: Recent Labs  Lab 11/16/20 0159 11/17/20 0121 11/18/20 0333 11/18/20 1233 11/19/20 0219  NA 134* 134* 128* 127* 127*  K 4.1 4.0  4.0 4.0 4.1  CL 95* 94* 92* 88* 90*  CO2 28 28 27 26 27   GLUCOSE 105* 120* 115* 112* 110*  BUN 14 19 17 20 17   CREATININE 0.94 1.07 0.90 0.84 0.81  CALCIUM 8.3* 8.2* 8.0* 8.2* 8.2*  MG  --  2.0  --   --   --    Liver Function Tests: Recent Labs  Lab 11/15/20 1639  AST 22  ALT 48*  ALKPHOS 122  BILITOT 1.1  PROT 6.0*  ALBUMIN 2.3*   No results for input(s): LIPASE, AMYLASE in the last 168 hours. No results for input(s): AMMONIA in the last 168 hours. CBC: Recent Labs  Lab 11/15/20 0953 11/17/20 0121  WBC 8.8 10.7*  HGB 12.0* 11.6*  HCT 35.6* 35.2*  MCV 92.2 89.3  PLT 479* 478*   Cardiac Enzymes: No results for input(s): CKTOTAL, CKMB, CKMBINDEX, TROPONINI in the last 168 hours. BNP: Invalid input(s): POCBNP  CBG: No results for input(s): GLUCAP in the last 168 hours.  Radiological Exams on Admission: ECHOCARDIOGRAM LIMITED  Result Date: 11/18/2020    ECHOCARDIOGRAM LIMITED REPORT   Patient Name:   Ricky Hood Date of Exam: 11/18/2020 Medical Rec #:  [id number]      Height:       67.0 in Accession #:    [id number]     Weight:       172.0 lb Date of Birth:  Apr 26, 1946      BSA:          1.897 m Patient Age:    74 years       BP:           120/75 mmHg Patient Gender: M              HR:           92 bpm. Exam Location:  Inpatient Procedure: Limited Color Doppler, Limited Echo and Cardiac Doppler Indications:    pericardial effusion  History:        Patient has prior history of Echocardiogram examinations, most                 recent 11/16/2020.  Sonographer:    Delcie Roch Referring Phys: [id number] Dorothe Pea BRANCH IMPRESSIONS  1. Limited echo for pericardial effusion  2. Right ventricular systolic function is normal. The right ventricular size is normal. There is normal pulmonary artery systolic pressure. The estimated right ventricular systolic pressure is 33.8 mmHg.  3. Small to moderate-sized pericardial effusion. The pericardial effusion is circumferential. There is  no evidence of cardiac tamponade.  4. The aortic valve is tricuspid. Aortic valve regurgitation is trivial.  5. Aortic dilatation noted. 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  6. The inferior vena cava is dilated in size with >50% respiratory variability, suggesting right atrial pressure of 8 mmHg.  7. Left ventricular ejection fraction, by estimation, is 60 to 65%. The left ventricle has normal function. Comparison(s): Changes from prior study are noted. 11/16/2020: LVEF 60-65%, large pericardial effusion. Compared to the prior study, the pericardial effusion is smaller. FINDINGS  Left Ventricle: Left ventricular ejection fraction, by estimation, is 60 to 65%. The left ventricle has normal function. Right Ventricle: The right ventricular size is normal. No increase in right ventricular wall thickness. Right ventricular systolic function is normal. There is normal pulmonary artery systolic pressure. The tricuspid regurgitant velocity is 2.54 m/s, and  with an assumed right atrial pressure of 8 mmHg, the estimated right ventricular systolic pressure is 33.8 mmHg. Pericardium: Small to moderate-sized pericardial effusion. The pericardial effusion is circumferential. There is no evidence of cardiac tamponade. Tricuspid Valve: The tricuspid valve is grossly normal. Tricuspid valve regurgitation is trivial. Aortic Valve: The aortic valve is tricuspid. Aortic valve regurgitation is trivial. Pulmonic Valve: The pulmonic valve was grossly normal. Pulmonic valve regurgitation is trivial. Aorta: Aortic dilatation noted. 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 Venous: The inferior vena cava is dilated in size with greater than 50% respiratory variability, suggesting right atrial pressure of 8 mmHg. IVC IVC diam: 2.20 cm  AORTA Ao Root diam:  4.00 cm Ao Asc diam:  4.50 cm TRICUSPID VALVE TR Peak grad:   25.8 mmHg TR Vmax:        254.00 cm/s Iantha Fallen  Hilty MD Electronically signed by Zoila Shutter MD Signature Date/Time: 11/18/2020/4:10:12 PM    Final     EKG: Independently reviewed.  Paced rhythm  Time spent: >45 minutes  Camyra Vaeth A Shalon Councilman [facility]   If 7PM-7AM, please contact night-coverage

## 2020-11-19 NOTE — Progress Notes (Addendum)
Progress Note  Patient Name: Ricky GentileStanley Twardowski Date of Encounter: 11/19/2020  [facility] Cardiologist: Dr. Tomie Chinaevankar/ Dr. Johney FrameAllred  Subjective   No acute overnight events. Patient feeling well this morning. He did have a couple of episodes last night where he desatted. He has sleep apnea and has not been using CPAP here. No shortness of breath, chest pain, or palpitations. Sodium remains low at 127 today.  Inpatient Medications    Scheduled Meds: . colchicine  0.6 mg Oral BID  . ibuprofen  600 mg Oral TID  . lisinopril  10 mg Oral QHS  . mouth rinse  15 mL Mouth Rinse BID  . nitroGLYCERIN  0.4 mg Transdermal Daily  . rivaroxaban  20 mg Oral Q supper  . rosuvastatin  5 mg Oral QHS  . sodium chloride flush  3 mL Intravenous Q12H   Continuous Infusions: . sodium chloride     PRN Meds: sodium chloride, acetaminophen, ondansetron (ZOFRAN) IV, psyllium, sodium chloride flush   Vital Signs    Vitals:   11/18/20 2324 11/19/20 0500 11/19/20 0523 11/19/20 0709  BP: 133/79  (!) 141/80 128/77  Pulse: 72  68 70  Resp: 20  19 17   Temp: 98.9 F (37.2 C)  97.9 F (36.6 C) 98 F (36.7 C)  TempSrc: Oral  Oral Oral  SpO2: 97%  94% 97%  Weight:  76.2 kg    Height:        Intake/Output Summary (Last 24 hours) at 11/19/2020 0723 Last data filed at 11/19/2020 0525 Gross per 24 hour  Intake 363 ml  Output 2900 ml  Net -2537 ml   Last 3 Weights 11/19/2020 11/18/2020 11/17/2020  Weight (lbs) 168 lb 172 lb 173 lb 4.8 oz  Weight (kg) 76.204 kg 78.019 kg 78.608 kg      Telemetry    Ventricular paced rhythm with underlying sinus rhythm. Rates in the 60's to 80's. - Personally Reviewed  ECG    No new ECG tracing today. - Personally Reviewed  Physical Exam   GEN: No acute distress.   Neck: No JVD. Cardiac: RRR. No murmurs, rubs, or gallops.  Respiratory: No increased work of breathing. Decreased breath sounds in bilateral bases (left > right) but improving from yesterday. No  crackles.  GI: Soft, non-distended, and non-tender. MS: No lower extremity edema. No deformity. Skin: Warm and dry. Neuro:  No focal deficits. Psych: Normal affect. Responds appropriately.  Labs    High Sensitivity Troponin:   Recent Labs  Lab 11/15/20 1639  TROPONINIHS 18*      Chemistry Recent Labs  Lab 11/15/20 1639 11/16/20 0159 11/18/20 0333 11/18/20 1233 11/19/20 0219  NA  --    < > 128* 127* 127*  K  --    < > 4.0 4.0 4.1  CL  --    < > 92* 88* 90*  CO2  --    < > 27 26 27   GLUCOSE  --    < > 115* 112* 110*  BUN  --    < > 17 20 17   CREATININE  --    < > 0.90 0.84 0.81  CALCIUM  --    < > 8.0* 8.2* 8.2*  PROT 6.0*  --   --   --   --   ALBUMIN 2.3*  --   --   --   --   AST 22  --   --   --   --   ALT 48*  --   --   --   --  ALKPHOS 122  --   --   --   --   BILITOT 1.1  --   --   --   --   GFRNONAA  --    < > >60 >60 >60  ANIONGAP  --    < > 9 13 10    < > = values in this interval not displayed.     Hematology Recent Labs  Lab 11/15/20 0953 11/17/20 0121  WBC 8.8 10.7*  RBC 3.86* 3.94*  HGB 12.0* 11.6*  HCT 35.6* 35.2*  MCV 92.2 89.3  MCH 31.1 29.4  MCHC 33.7 33.0  RDW 12.7 12.6  PLT 479* 478*    BNP Recent Labs  Lab 11/16/20 0159  BNP 481.4*     DDimer No results for input(s): DDIMER in the last 168 hours.   Radiology    ECHOCARDIOGRAM LIMITED  Result Date: 11/18/2020    ECHOCARDIOGRAM LIMITED REPORT   Patient Name:   Ricky Hood Date of Exam: 11/18/2020 Medical Rec #:  11/20/2020      Height:       67.0 in Accession #:    [id number]     Weight:       172.0 lb Date of Birth:  [birth year]      BSA:          1.897 m Patient Age:    74 years       BP:           120/75 mmHg Patient Gender: M              HR:           92 bpm. Exam Location:  Inpatient Procedure: Limited Color Doppler, Limited Echo and Cardiac Doppler Indications:    pericardial effusion  History:        Patient has prior history of Echocardiogram examinations, most                  recent 11/16/2020.  Sonographer:    11/18/2020 Referring Phys: Delcie Roch [id number] BRANCH IMPRESSIONS  1. Limited echo for pericardial effusion  2. Right ventricular systolic function is normal. The right ventricular size is normal. There is normal pulmonary artery systolic pressure. The estimated right ventricular systolic pressure is 33.8 mmHg.  3. Small to moderate-sized pericardial effusion. The pericardial effusion is circumferential. There is no evidence of cardiac tamponade.  4. The aortic valve is tricuspid. Aortic valve regurgitation is trivial.  5. Aortic dilatation noted. 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  6. The inferior vena cava is dilated in size with >50% respiratory variability, suggesting right atrial pressure of 8 mmHg.  7. Left ventricular ejection fraction, by estimation, is 60 to 65%. The left ventricle has normal function. Comparison(s): Changes from prior study are noted. 11/16/2020: LVEF 60-65%, large pericardial effusion. Compared to the prior study, the pericardial effusion is smaller. FINDINGS  Left Ventricle: Left ventricular ejection fraction, by estimation, is 60 to 65%. The left ventricle has normal function. Right Ventricle: The right ventricular size is normal. No increase in right ventricular wall thickness. Right ventricular systolic function is normal. There is normal pulmonary artery systolic pressure. The tricuspid regurgitant velocity is 2.54 m/s, and  with an assumed right atrial pressure of 8 mmHg, the estimated right ventricular systolic pressure is 33.8 mmHg. Pericardium: Small to moderate-sized pericardial effusion. The pericardial effusion is circumferential. There is  no evidence of cardiac tamponade. Tricuspid Valve: The tricuspid valve is grossly normal. Tricuspid valve regurgitation is trivial. Aortic Valve: The aortic valve is tricuspid. Aortic valve  regurgitation is trivial. Pulmonic Valve: The pulmonic valve was grossly normal. Pulmonic valve regurgitation is trivial. Aorta: Aortic dilatation noted. 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 Venous: The inferior vena cava is dilated in size with greater than 50% respiratory variability, suggesting right atrial pressure of 8 mmHg. IVC IVC diam: 2.20 cm  AORTA Ao Root diam: 4.00 cm Ao Asc diam:  4.50 cm TRICUSPID VALVE TR Peak grad:   25.8 mmHg TR Vmax:        254.00 cm/s Zoila Shutter MD Electronically signed by Zoila Shutter MD Signature Date/Time: 11/18/2020/4:10:12 PM    Final     Cardiac Studies   Complete Echocardiogram 11/16/2020: Impressions: 1. There is a large pericardial effusion present measuring 2.6cm at end  diastole. Appears to be largest at the LV apex. The is about 25%  respiratory variation across the mitral valve. IVC is dilated but  collapses >50% with inspiration suggesting RAP  . There is no RV/RA diastolic collapse. Overall, no tamponade  physiology present.  2. Left ventricular ejection fraction, by estimation, is 60 to 65%. The  left ventricle has normal function. The left ventricle has no regional  wall motion abnormalities. There is mild concentric left ventricular  hypertrophy. Left ventricular diastolic  parameters are consistent with Grade I diastolic dysfunction (impaired  relaxation).  3. Right ventricular systolic function is normal. The right ventricular  size is normal. There is moderately elevated pulmonary artery systolic  pressure.  4. The mitral valve is normal in structure. Mild mitral valve  regurgitation.  5. Tricuspid valve regurgitation is moderate.  6. The aortic valve is tricuspid. There is mild calcification of the  aortic valve. There is mild thickening of the aortic valve. Aortic valve  regurgitation is mild. Mild aortic valve sclerosis is  present, with no  evidence of aortic valve stenosis.  7. Aortic dilatation noted. There is mild dilatation of the aortic root,  measuring 43 mm. There is mild to moderate dilatation of the ascending  aorta, measuring 43 mm.  8. The inferior vena cava is dilated in size with >50% respiratory  variability, suggesting right atrial pressure of 8 mmHg.   Comparison(s): Compared to prior echo on 09/17/20, there is now a large  pericardial effusion present. Findings discussed with Dr. Wyline Mood.  _______________  Limited Echocardiogram 11/18/2020: Impressions: 1. Limited echo for pericardial effusion  2. Right ventricular systolic function is normal. The right ventricular  size is normal. There is normal pulmonary artery systolic pressure. The  estimated right ventricular systolic pressure is 33.8 mmHg.  3. Small to moderate-sized pericardial effusion. The pericardial effusion  is circumferential. There is no evidence of cardiac tamponade.  4. The aortic valve is tricuspid. Aortic valve regurgitation is trivial.  5. Aortic dilatation noted. There is mild dilatation of the aortic root,  measuring 41 mm. There is moderate dilatation of the ascending aorta,  measuring 45 mm.  6. The inferior vena cava is dilated in size with >50% respiratory  variability, suggesting right atrial pressure of 8 mmHg.  7. Left ventricular ejection fraction, by estimation, is 60 to 65%. The  left ventricle has normal function.   Comparison(s): Changes from prior study are noted. 11/16/2020: LVEF  60-65%, large pericardial effusion. Compared to  the prior study, the  pericardial effusion is smaller.    Patient Profile     74 y.o. 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  Assessment & Plan    New Onset Acute Diastolic CHF - Presented with shortness of breath and 21lb weight gain.  - BNP elevated at 481. - Chest x-ray showed patchy bilateral pulmonary opacities and new moderate left and trace right pleural effusions. - Echo showed LVEF of 60-65% with normal wall motion, grade 1 diastolic dysfunction, mild MR, moderate TR, moderately elevated PASP as well as a new large pericardial effusion. - Was on IV Lasix  twice daily but this was held yesterday evening due to hyponatremia. Documented urinary output of 2.9 L in the past 24 hours and net negative 11 L since admission. Weight 168 lbs today, down 4 lbs from yesterday and  19 lbs from admission. Patient reports baseline weight is around 160-165 lbs. Renal function normal.  - Sodium is 127 but I think patient needs some additional diuresis. Think we can restart IV Lasix  daily but will discuss with MD first. - Continue to monitor daily weights, strict I/O's, and renal function.  Pericardial Effusion - Echo this admission showed large pericardial effusion measuring 2.6cm at end diastole. Largest at the LV apex. No evidence of tamponade. This was new from Echo in 08/2020. Repeat Echo on 12/20 showed improvement in effusion - considered small to moderate. - Patient is hemodynamically stable. - TSH normal. - Sed rate elevated at 70. High sensitivity CRP elevated at 172. He was started on Ibuprofen  three times daily and Colchicine 0.6mg  twice daily yesterday. - Per MD, will consult [facility] for assistance in workup of underlying cause (rheumatologic, collagen vascular disease, etc.).  Paroxysmal Atrial Fibrillation - Diagnosed on 11/08/2020 and started on Flecainide which has now been stopped in setting of CHF.  - Telemetry shows ventricular paced rhythm with underlying sinus rhythm.  - Xarelto initial held for possible  pericardiocentesis but this was restarted yesterday given improvement in pericardial effusion on repeat Echo.  Complete Heart Block s/p PPM - Followed by Dr. Johney Frame.  Hypertension - BP well controlled. - Continue home Lisinopril  daily and diuresis.   Hyperlipidemia - Continue home Crestor.  Hyponatremia - Sodium 131 on admission. Initially improved with diuresis but dropped again yesterday to 128 and 127 today.  - Patient reported drinking 1 gallon of water daily. He was advised to limit water intake to 64 ounces daily. He states he has not been drinking much water during this admission. - Will also ask [facility] to help with this.  Obstructive Sleep Apnea - On CPAP at home but has not been using here. He did have some episodes where he desatted last night.  - Will ask RT to help with CPAP tonight. Wife will bring patient's home mask and mouth guard tomorrow.   For questions or updates, please contact [facility] Please consult [email or web address] for contact info under        Signed, Corrin Parker, PA-C  11/19/2020, 7:23 AM    Attending Note:   The patient was seen and examined.  Agree with assessment and plan as noted above.  Changes made to the above note as needed.  Patient seen and independently examined with Marjie Skiff, PA .   We discussed all aspects of the encounter. I agree with the assessment and plan as stated above.  1.   Pericardial effusion:  No evidence of tamponade.   The effusion appears to be improving .   2.  Hyponatremia :   We have asked [facility] to help [place] with this .  Will continue to hold lasix - I think he is euvolumic today  Contin to follow  Appreciate Dr. Michaelle Copas assistance in evaluating his medical issues .    3.    I have spent a total of 40 minutes with patient reviewing hospital  notes , telemetry, EKGs, labs and examining patient as well as establishing an assessment and plan that was discussed with the patient. >  50% of time was spent in direct patient care.    Vesta Mixer, Montez Hageman., MD, [facility] 11/19/2020, 11:10 AM [address]  [address] Office [phone] Pager [phone]

## 2020-11-19 NOTE — Discharge Instructions (Addendum)
Medication Changes: - STOP Flecainide. - START Colchicine 0.6mg  twice daily for 3 months. - START Ibuprofen 600mg  three times daily for 10 days.  - START Protonix 40mg  once daily while on Ibuprofen. - RESTART Xarelto tomorrow (11/25/2020). - START Sodium Chloride tablet 1g twice daily.   Please come to our office in 3-4 days for a lab visit so that we can recheck your sodium level.  Heart Failure Education: 1. Weigh yourself EVERY morning after you go to the bathroom but before you eat or drink anything. Write this number down in a weight log/diary. If you gain 3 pounds overnight or 5 pounds in a week, call the office. 2. Take your medicines as prescribed. If you have concerns about your medications, please call before you stop taking them.  3. Eat low salt foods--Limit salt (sodium) to 2000 mg per day. This will help prevent your body from holding onto fluid. Read food labels as many processed foods have a lot of sodium, especially canned goods and prepackaged meats. If you would like some assistance choosing low sodium foods, we would be happy to set you up with a nutritionist. 4. Stay as active as you can everyday. Staying active will give you more energy and make your muscles stronger. Start with 5 minutes at a time and work your way up to 30 minutes a day. Break up your activities--do some in the morning and some in the afternoon. Start with 3 days per week and work your way up to 5 days as you can.  If you have chest pain, feel short of breath, dizzy, or lightheaded, STOP. If you don't feel better after a short rest, call 911. If you do feel better, call the office to let 11/27/2020 know you have symptoms with exercise. 5. Limit all fluids for the day to less than 1.5 liters. Fluid includes all drinks, coffee, juice, ice chips, soup, jello, and all other liquids.   Information on my medicine - XARELTO (Rivaroxaban) Why was Xarelto prescribed for you? Xarelto was prescribed for you to reduce  the risk of a blood clot forming that can cause a stroke if you have a medical condition called atrial fibrillation (a type of irregular heartbeat).  What do you need to know about xarelto ? Take your Xarelto ONCE DAILY at the same time every day with your evening meal. If you have difficulty swallowing the tablet whole, you may crush it and mix in applesauce just prior to taking your dose.  Take Xarelto exactly as prescribed by your doctor and DO NOT stop taking Xarelto without talking to the doctor who prescribed the medication.  Stopping without other stroke prevention medication to take the place of Xarelto may increase your risk of developing a clot that causes a stroke.  Refill your prescription before you run out.  After discharge, you should have regular check-up appointments with your healthcare provider that is prescribing your Xarelto.  In the future your dose may need to be changed if your kidney function or weight changes by a significant amount.  What do you do if you miss a dose? If you are taking Xarelto ONCE DAILY and you miss a dose, take it as soon as you remember on the same day then continue your regularly scheduled once daily regimen the next day. Do not take two doses of Xarelto at the same time or on the same day.   Important Safety Information A possible side effect of Xarelto is bleeding. You should  call your healthcare provider right away if you experience any of the following: ? Bleeding from an injury or your nose that does not stop. ? Unusual colored urine (red or dark brown) or unusual colored stools (red or black). ? Unusual bruising for unknown reasons. ? A serious fall or if you hit your head (even if there is no bleeding).  Some medicines may interact with Xarelto and might increase your risk of bleeding while on Xarelto. To help avoid this, consult your healthcare provider or pharmacist prior to using any new prescription or non-prescription  medications, including herbals, vitamins, non-steroidal anti-inflammatory drugs (NSAIDs) and supplements.  This website has more information on Xarelto: VisitDestination.com.br.

## 2020-11-20 ENCOUNTER — Inpatient Hospital Stay (HOSPITAL_COMMUNITY): Payer: Medicare Other

## 2020-11-20 LAB — OSMOLALITY, URINE: Osmolality, Ur: 367 mOsm/kg (ref 300–900)

## 2020-11-20 LAB — BASIC METABOLIC PANEL
Anion gap: 10 (ref 5–15)
BUN: 16 mg/dL (ref 8–23)
CO2: 25 mmol/L (ref 22–32)
Calcium: 8.4 mg/dL — ABNORMAL LOW (ref 8.9–10.3)
Chloride: 92 mmol/L — ABNORMAL LOW (ref 98–111)
Creatinine, Ser: 0.86 mg/dL (ref 0.61–1.24)
GFR, Estimated: >60 mL/min (ref >60)
Glucose, Bld: 98 mg/dL (ref 70–99)
Potassium: 4.9 mmol/L (ref 3.5–5.1)
Sodium: 127 mmol/L — ABNORMAL LOW (ref 135–145)

## 2020-11-20 LAB — COMPLEMENT, TOTAL: Compl, Total (CH50): >60 U/mL (ref >41)

## 2020-11-20 LAB — SODIUM, URINE, RANDOM: Sodium, Ur: 72 mmol/L

## 2020-11-20 LAB — RPR: RPR Ser Ql: NONREACTIVE

## 2020-11-20 LAB — ANA: Anti Nuclear Antibody (ANA): NEGATIVE

## 2020-11-20 LAB — PREALBUMIN: Prealbumin: 13.5 mg/dL — ABNORMAL LOW (ref 18–38)

## 2020-11-20 LAB — RHEUMATOID FACTOR: Rheumatoid fact SerPl-aCnc: 13.4 IU/mL (ref <14.0)

## 2020-11-20 MED ORDER — PANTOPRAZOLE SODIUM 40 MG PO TBEC
40.0000 mg | DELAYED_RELEASE_TABLET | Freq: Every day | ORAL | Status: DC
  Administered 2020-11-20 – 2020-11-24 (×5): 40 mg via ORAL
  Filled 2020-11-20 (×5): qty 1

## 2020-11-20 NOTE — Plan of Care (Signed)
  Problem: Education: Goal: Ability to demonstrate management of disease process will improve Outcome: Progressing Goal: Ability to verbalize understanding of medication therapies will improve Outcome: Progressing   Problem: Activity: Goal: Capacity to carry out activities will improve Outcome: Progressing   Problem: Cardiac: Goal: Ability to achieve and maintain adequate cardiopulmonary perfusion will improve Outcome: Progressing   Problem: Coping: Goal: Level of anxiety will decrease Outcome: Progressing   Problem: Elimination: Goal: Will not experience complications related to bowel motility Outcome: Progressing   Problem: Pain Managment: Goal: General experience of comfort will improve Outcome: Progressing   Problem: Safety: Goal: Ability to remain free from injury will improve Outcome: Progressing

## 2020-11-20 NOTE — TOC Benefit Eligibility Note (Signed)
Transition of Care [facility]) Benefit Eligibility Note    Patient Details  Name: Ricky Hood MRN: [id number] Date of Birth: Sep 11, 1946   Medication/Dose: COLCHICINE TABLET  0.6 MG BID  Covered?: Yes  Tier: 3 Drug  Prescription Coverage Preferred Pharmacy: [place] with Person/Company/Phone Number:: [facility] @ [employer] [facility] #  [phone]  Co-Pay: $37.00  Prior Approval: No  Deductible: Met       Memory Argue Phone Number: 11/20/2020, 1:27 PM

## 2020-11-20 NOTE — Progress Notes (Addendum)
Progress Note  Patient Name: Ricky Hood Date of Encounter: 11/20/2020  [facility] Cardiologist: Dr. Tomie Chinaevankar   Subjective   Pt uses a BiPAP at home, unable to use mask here due to facial hair.  Inpatient Medications    Scheduled Meds: . colchicine  0.6 mg Oral BID  . ibuprofen  600 mg Oral TID  . lisinopril  10 mg Oral QHS  . mouth rinse  15 mL Mouth Rinse BID  . nitroGLYCERIN  0.4 mg Transdermal Daily  . pantoprazole  40 mg Oral Daily  . rivaroxaban  20 mg Oral Q supper  . rosuvastatin  5 mg Oral QHS  . sodium chloride flush  3 mL Intravenous Q12H   Continuous Infusions: . sodium chloride     PRN Meds: sodium chloride, acetaminophen, ondansetron (ZOFRAN) IV, psyllium, sodium chloride flush   Vital Signs    Vitals:   11/19/20 2118 11/20/20 0200 11/20/20 0441 11/20/20 0453  BP: (!) 159/83 115/76 137/83   Pulse: 73 66 69   Resp: 16 16 18    Temp: 97.7 F (36.5 C) 97.6 F (36.4 C) 97.8 F (36.6 C)   TempSrc: Oral Oral Oral   SpO2: 95% 97% 95%   Weight:   75.3 kg 75.3 kg  Height:        Intake/Output Summary (Last 24 hours) at 11/20/2020 0951 Last data filed at 11/20/2020 0500 Gross per 24 hour  Intake 1115 ml  Output 3650 ml  Net -2535 ml   Last 3 Weights 11/20/2020 11/20/2020 11/19/2020  Weight (lbs) 166 lb 1.6 oz 166 lb 1.6 oz 168 lb  Weight (kg) 75.342 kg 75.342 kg 76.204 kg      Telemetry    Paced rhythm in the 60-70s - Personally Reviewed  ECG    No new tracings - Personally Reviewed  Physical Exam   GEN: No acute distress.   Neck: No JVD Cardiac: RRR, no murmurs, rubs, or gallops.  Respiratory: respirations unlabored, diminished in left base GI: Soft, nontender, non-distended  MS: No edema; No deformity. Neuro:  Nonfocal  Psych: Normal affect   Labs    High Sensitivity Troponin:   Recent Labs  Lab 11/15/20 1639  TROPONINIHS 18*      Chemistry Recent Labs  Lab 11/15/20 1639 11/16/20 0159 11/18/20 1233  11/19/20 0219 11/20/20 0244  NA  --    < > 127* 127* 127*  K  --    < > 4.0 4.1 4.9  CL  --    < > 88* 90* 92*  CO2  --    < > 26 27 25   GLUCOSE  --    < > 112* 110* 98  BUN  --    < > 20 17 16   CREATININE  --    < > 0.84 0.81 0.86  CALCIUM  --    < > 8.2* 8.2* 8.4*  PROT 6.0*  --   --   --   --   ALBUMIN 2.3*  --   --   --   --   AST 22  --   --   --   --   ALT 48*  --   --   --   --   ALKPHOS 122  --   --   --   --   BILITOT 1.1  --   --   --   --   GFRNONAA  --    < > >60 >60 >60  ANIONGAP  --    < > 13 10 10    < > = values in this interval not displayed.     Hematology Recent Labs  Lab 11/15/20 0953 11/17/20 0121  WBC 8.8 10.7*  RBC 3.86* 3.94*  HGB 12.0* 11.6*  HCT 35.6* 35.2*  MCV 92.2 89.3  MCH 31.1 29.4  MCHC 33.7 33.0  RDW 12.7 12.6  PLT 479* 478*    BNP Recent Labs  Lab 11/16/20 0159  BNP 481.4*     DDimer No results for input(s): DDIMER in the last 168 hours.   Radiology    ECHOCARDIOGRAM LIMITED  Result Date: 11/18/2020    ECHOCARDIOGRAM LIMITED REPORT   Patient Name:   Ricky Hood Date of Exam: 11/18/2020 Medical Rec #:  11/20/2020      Height:       67.0 in Accession #:    [id number]     Weight:       172.0 lb Date of Birth:  [birth year]      BSA:          1.897 m Patient Age:    74 years       BP:           120/75 mmHg Patient Gender: M              HR:           92 bpm. Exam Location:  Inpatient Procedure: Limited Color Doppler, Limited Echo and Cardiac Doppler Indications:    pericardial effusion  History:        Patient has prior history of Echocardiogram examinations, most                 recent 11/16/2020.  Sonographer:    11/18/2020 Referring Phys: Delcie Roch [id number] BRANCH IMPRESSIONS  1. Limited echo for pericardial effusion  2. Right ventricular systolic function is normal. The right ventricular size is normal. There is normal pulmonary artery systolic pressure. The estimated right ventricular systolic pressure is 33.8 mmHg.  3.  Small to moderate-sized pericardial effusion. The pericardial effusion is circumferential. There is no evidence of cardiac tamponade.  4. The aortic valve is tricuspid. Aortic valve regurgitation is trivial.  5. Aortic dilatation noted. 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  6. The inferior vena cava is dilated in size with >50% respiratory variability, suggesting right atrial pressure of 8 mmHg.  7. Left ventricular ejection fraction, by estimation, is 60 to 65%. The left ventricle has normal function. Comparison(s): Changes from prior study are noted. 11/16/2020: LVEF 60-65%, large pericardial effusion. Compared to the prior study, the pericardial effusion is smaller. FINDINGS  Left Ventricle: Left ventricular ejection fraction, by estimation, is 60 to 65%. The left ventricle has normal function. Right Ventricle: The right ventricular size is normal. No increase in right ventricular wall thickness. Right ventricular systolic function is normal. There is normal pulmonary artery systolic pressure. The tricuspid regurgitant velocity is 2.54 m/s, and  with an assumed right atrial pressure of 8 mmHg, the estimated right ventricular systolic pressure is 33.8 mmHg. Pericardium: Small to moderate-sized pericardial effusion. The pericardial effusion is circumferential. There is no evidence of cardiac tamponade. Tricuspid Valve: The tricuspid valve is grossly normal. Tricuspid valve regurgitation is trivial. Aortic Valve: The aortic valve is tricuspid. Aortic valve regurgitation is trivial. Pulmonic Valve: The pulmonic valve was grossly normal. Pulmonic valve regurgitation is  trivial. Aorta: Aortic dilatation noted. 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 Venous: The inferior vena cava is dilated in size with greater than 50% respiratory  variability, suggesting right atrial pressure of 8 mmHg. IVC IVC diam: 2.20 cm  AORTA Ao Root diam: 4.00 cm Ao Asc diam:  4.50 cm TRICUSPID VALVE TR Peak grad:   25.8 mmHg TR Vmax:        254.00 cm/s Zoila Shutter MD Electronically signed by Zoila Shutter MD Signature Date/Time: 11/18/2020/4:10:12 PM    Final     Cardiac Studies   Echo limited 11/18/20: 1. Limited echo for pericardial effusion  2. Right ventricular systolic function is normal. The right ventricular  size is normal. There is normal pulmonary artery systolic pressure. The  estimated right ventricular systolic pressure is 33.8 mmHg.  3. Small to moderate-sized pericardial effusion. The pericardial effusion  is circumferential. There is no evidence of cardiac tamponade.  4. The aortic valve is tricuspid. Aortic valve regurgitation is trivial.  5. Aortic dilatation noted. There is mild dilatation of the aortic root,  measuring 41 mm. There is moderate dilatation of the ascending aorta,  measuring 45 mm.  6. The inferior vena cava is dilated in size with >50% respiratory  variability, suggesting right atrial pressure of 8 mmHg.  7. Left ventricular ejection fraction, by estimation, is 60 to 65%. The  left ventricle has normal function.    Echo 11/16/20: 1. There is a large pericardial effusion present measuring 2.6cm at end  diastole. Appears to be largest at the LV apex. The is about 25%  respiratory variation across the mitral valve. IVC is dilated but  collapses >50% with inspiration suggesting RAP  . There is no RV/RA diastolic collapse. Overall, no tamponade  physiology present.  2. Left ventricular ejection fraction, by estimation, is 60 to 65%. The  left ventricle has normal function. The left ventricle has no regional  wall motion abnormalities. There is mild concentric left ventricular  hypertrophy. Left ventricular diastolic  parameters are consistent with Grade I diastolic dysfunction (impaired   relaxation).  3. Right ventricular systolic function is normal. The right ventricular  size is normal. There is moderately elevated pulmonary artery systolic  pressure.  4. The mitral valve is normal in structure. Mild mitral valve  regurgitation.  5. Tricuspid valve regurgitation is moderate.  6. The aortic valve is tricuspid. There is mild calcification of the  aortic valve. There is mild thickening of the aortic valve. Aortic valve  regurgitation is mild. Mild aortic valve sclerosis is present, with no  evidence of aortic valve stenosis.  7. Aortic dilatation noted. There is mild dilatation of the aortic root,  measuring 43 mm. There is mild to moderate dilatation of the ascending  aorta, measuring 43 mm.  8. The inferior vena cava is dilated in size with >50% respiratory  variability, suggesting right atrial pressure of 8 mmHg.   Patient Profile     74 y.o. male   Assessment & Plan    Acute diastolic heart failure - echo with preserved EF - BNP 481 - diuresis held for hyponatremia - Na 127 - stable - diuretic still held - appears near euvolemic - CXR with left pericardial effusion - continues to be diminished in left base - would repeat CXR today to evaluate pleural effusion   Pericardial effusion - echo this admission with large pericardial effusion --> repeat  limited echo 12/20 with small to moderate pericardial effusion - no evidence of tamponade, hemodynamically stable - elevated sed rate of 70 and CRP 172 - started on ibuprofen 600 mg TID and colchicine 0.6 mg BID - will add protonix - repeat echo in 1 week - question etiology - rheumatoid factor negative, ANA and complement pending   Aortic root dilation Ascending aorta dilated - aortic root measuring 41 mm - ascending aorta measuring 45 mm   CHB s/p recent PPM (09/17/20) - telemetry with paced rhythm   Paroxysmal atrial fibrillation - diagnosed 11/08/20 and started on flecainide, which was  discontinued to heart failure - telemetry with paed rhythm - continue xarelto This patients CHA2DS2-VASc Score and unadjusted Ischemic Stroke Rate (% per year) is equal to 3.2 % stroke rate/year from a score of 3 (HTN, CHF, age)    OSA on CPAP Hyponatremia - per primary    For questions or updates, please contact [facility] Please consult [email or web address] for contact info under        Signed, Marcelino Duster, PA  11/20/2020, 9:51 AM    Attending Note:   The patient was seen and examined.  Agree with assessment and plan as noted above.  Changes made to the above note as needed.  Patient seen and independently examined with  Bettina Gavia, PA .   We discussed all aspects of the encounter. I agree with the assessment and plan as stated above.  1.   Pericardial effusion .  Continues to improve  2. Hyponatremia:   24 hr urine collection .   Further recs per int. Medicine team Off diuretics  Appreciate the assistance of [facility].     I have spent a total of 40 minutes with patient reviewing hospital  notes , telemetry, EKGs, labs and examining patient as well as establishing an assessment and plan that was discussed with the patient. > 50% of time was spent in direct patient care.    Vesta Mixer, Montez Hageman., MD, [facility] 11/20/2020, 11:21 AM [address],  [address] Office [phone] Pager [phone]

## 2020-11-20 NOTE — Care Management (Signed)
1058 11-20-20 Benefits check submitted for colchicine. Case Manager will follow for cost. Graves-Bigelow, Lamar Laundry, RN,BSN Case Manager

## 2020-11-20 NOTE — Progress Notes (Signed)
PROGRESS NOTE    Ricky Hood  CLE:[id number] DOB: 12-Jul-1946 DOA: 11/15/2020 PCP: Marlyn Corporal, PA   Brief Narrative:  Ricky Hood is a 74 y.o. 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  He had recently been started on flecainide and Xarelto by Dr. Johney Frame on 11/08/2020 during a follow-up visit after interrogation of pacemaker noted episodes of atrial fibrillation.  His family noticed that he began looking puffier and reportedly had a 23 pound weight gain.  He follow-up in atrial fibrillation clinic and was noted to be short of breath and had been sent to the hospital for further evaluation.  X-ray imaging revealed pulmonary edema and BNP was elevated at 481.4.  He was admitted to the cardiology service and started on IV diuresis. Echocardiogram 12/18 revealed a large pericardial effusion without tamponade features.  He was started on ibuprofen and colchicine.  Since admission sodium levels had trended from 131 to 134, and then back down to 127.  Since being in the hospital is leg swelling has resolved and he feels like he is back to his normal weight.  Repeat echocardiogram from 12/20 showed reduced pericardial effusion.  Patient reports significant family history of rheumatoid arthritis in both of his parents as well as a son with juvenile rheumatoid arthritis.  He personally had never been diagnosed with rheumatoid arthritis.  He intermittently has some mild back pain.  Denies having any significant joint pains, skin changes, nodules, fevers, chills, falls, trauma history of TB, or recent sick contacts.  [place], but he lives in a residential neighborhood.  Patient also reports that he hikes, but the last take that he ever had to remove it was very small in over  3 years ago.  Patient reports that he had received his booster for COVID-19 on October 29.  He also reported prior history of having low sodium levels in the past for which he was advised by his primary care provider to increase his salt intake.   Assessment & Plan:   Diastolic heart failure: Acute.   -Patient presented with complaints of shortness of breath.  Chest x-ray showed patchy bilateral infiltrates concerning for edema with bilateral pleural effusions.  And BNP elevated at 481.4.   -Echocardiogram had revealed EF 60-65% with large pericardial effusion present without signs of cardiac tamponade.   -With IV diuretics patient had improvement in lower extremity swelling and weight trended back to near patient's reported baseline.   Pericardial effusion: Initially seen to have a large pericardial effusion without cardiac tamponade features on echocardiogram.  - Labs revealing sedimentation rate 70 and CRP 172.89 .   -Patient was started on colchicine and ibuprofen.   -Repeat echocardiogram from 12/20 noting reduction in pericardial effusion size for which pericardiocentesis was not needed. -Thyroid seems less likely a cause as TSH 1.706 on 12/18.  -Continue colchicine and ibuprofen protocol -Rheumatoid factor: Complement level, ANA: All came back within normal limits.  Hyponatremia: Acute.  Sodium levels since admission 131->134->134-> 128->127->127.   -Urine sodium: 110, 72. urine osmolarity: 586, 367 -Continue to hold IV Lasix and fluid restrict <1.5 L/day -Repeat BMP tomorrow a.m.  Complete heart block s/p PM -Continue to monitor on telemetry.  Paroxysmal atrial fibrillation on anticoagulation:CHA2DS2-VASc score =3.    Continue Xarelto  Hypoalbuminemia: Acute.  On admission patient's albumin levels were  noted to be low 2.3.  Prealbumin: 13.5. -Consult dietitian.  DVT prophylaxis: Xarelto Code Status: Full code Family Communication: Patient's wife present at bedside.  Plan  of care discussed with patient in length and he verbalized understanding and agreed with it. Disposition Plan: To be determined   Procedures:   Echo  Antimicrobials:   None  Status is: Inpatient  Remains inpatient appropriate because:Persistent severe electrolyte disturbances   Dispo: The patient is from: Home              Anticipated d/c is to: Home              Anticipated d/c date is: 1 day              Patient currently is not medically stable to d/c.    Subjective: Patient seen and examined.  Wife at bedside.  Patient denies any new complaints.  Wife reported that patient drinks a lot of water each day and she is concerned that his low sodium is because of that.  Objective: Vitals:   11/20/20 0441 11/20/20 0453 11/20/20 1139 11/20/20 1145  BP: 137/83  113/64   Pulse: 69  75 75  Resp: 18  (!) 32 (!) 22  Temp: 97.8 F (36.6 C)  98.5 F (36.9 C) 98.5 F (36.9 C)  TempSrc: Oral  Oral   SpO2: 95%  95% 96%  Weight: 75.3 kg 75.3 kg    Height:        Intake/Output Summary (Last 24 hours) at 11/20/2020 1702 Last data filed at 11/20/2020 1000 Gross per 24 hour  Intake 995 ml  Output 3000 ml  Net -2005 ml   Filed Weights   11/19/20 0500 11/20/20 0441 11/20/20 0453  Weight: 76.2 kg 75.3 kg 75.3 kg    Examination:  General exam: Appears calm and comfortable, on room air, communicating well Respiratory system: Clear to auscultation. Respiratory effort normal. Cardiovascular system: S1 & S2 heard, RRR. No JVD, murmurs, rubs, gallops or clicks. No pedal edema. Gastrointestinal system: Abdomen is nondistended, soft and nontender. No organomegaly or masses felt. Normal bowel sounds heard. Central nervous system: Alert and oriented. No focal neurological deficits. Extremities: Symmetric 5 x 5 power. Skin: No rashes, lesions or ulcers Psychiatry: Judgement and insight appear normal. Mood & affect appropriate.    Data Reviewed: I have personally reviewed following  labs and imaging studies  CBC: Recent Labs  Lab 11/15/20 0953 11/17/20 0121  WBC 8.8 10.7*  HGB 12.0* 11.6*  HCT 35.6* 35.2*  MCV 92.2 89.3  PLT 479* 478*   Basic Metabolic Panel: Recent Labs  Lab 11/17/20 0121 11/18/20 0333 11/18/20 1233 11/19/20 0219 11/20/20 0244  NA 134* 128* 127* 127* 127*  K 4.0 4.0 4.0 4.1 4.9  CL 94* 92* 88* 90* 92*  CO2 28 27 26 27 25   GLUCOSE 120* 115* 112* 110* 98  BUN 19 17 20 17 16   CREATININE 1.07 0.90 0.84 0.81 0.86  CALCIUM 8.2* 8.0* 8.2* 8.2* 8.4*  MG 2.0  --   --   --   --    GFR: Estimated Creatinine Clearance: 70.5 mL/min (by C-G formula based on SCr of 0.86 mg/dL). Liver Function Tests: Recent Labs  Lab 11/15/20 1639  AST 22  ALT 48*  ALKPHOS 122  BILITOT 1.1  PROT 6.0*  ALBUMIN 2.3*   No results for input(s): LIPASE, AMYLASE in the last 168 hours. No results for input(s): AMMONIA in the last 168 hours. Coagulation Profile:  No results for input(s): INR, PROTIME in the last 168 hours. Cardiac Enzymes: No results for input(s): CKTOTAL, CKMB, CKMBINDEX, TROPONINI in the last 168 hours. BNP (last 3 results) No results for input(s): PROBNP in the last 8760 hours. HbA1C: No results for input(s): HGBA1C in the last 72 hours. CBG: No results for input(s): GLUCAP in the last 168 hours. Lipid Profile: No results for input(s): CHOL, HDL, LDLCALC, TRIG, CHOLHDL, LDLDIRECT in the last 72 hours. Thyroid Function Tests: No results for input(s): TSH, T4TOTAL, FREET4, T3FREE, THYROIDAB in the last 72 hours. Anemia Panel: No results for input(s): VITAMINB12, FOLATE, FERRITIN, TIBC, IRON, RETICCTPCT in the last 72 hours. Sepsis Labs: No results for input(s): PROCALCITON, LATICACIDVEN in the last 168 hours.  Recent Results (from the past 240 hour(s))  Resp Panel by RT-PCR (Flu A&B, Covid) Nasopharyngeal Swab     Status: None   Collection Time: 11/15/20  5:30 PM   Specimen: Nasopharyngeal Swab; Nasopharyngeal(NP) swabs in vial  transport medium  Result Value Ref Range Status   SARS Coronavirus 2 by RT PCR NEGATIVE NEGATIVE Final    Comment: (NOTE) SARS-CoV-2 target nucleic acids are NOT DETECTED.  The SARS-CoV-2 RNA is generally detectable in upper respiratory specimens during the acute phase of infection. The lowest concentration of SARS-CoV-2 viral copies this assay can detect is 138 copies/mL. A negative result does not preclude SARS-Cov-2 infection and should not be used as the sole basis for treatment or other patient management decisions. A negative result may occur with  improper specimen collection/handling, submission of specimen other than nasopharyngeal swab, presence of viral mutation(s) within the areas targeted by this assay, and inadequate number of viral copies(<138 copies/mL). A negative result must be combined with clinical observations, patient history, and epidemiological information. The expected result is Negative.  Fact Sheet for Patients:  BloggerCourse.com  Fact Sheet for Healthcare Providers:  SeriousBroker.it  This test is no t yet approved or cleared by the [place] FDA and  has been authorized for detection and/or diagnosis of SARS-CoV-2 by FDA under an Emergency Use Authorization (EUA). This EUA will remain  in effect (meaning this test can be used) for the duration of the COVID-19 declaration under Section 564(b)(1) of the Act, 21 U.S.C.section 360bbb-3(b)(1), unless the authorization is terminated  or revoked sooner.       Influenza A by PCR NEGATIVE NEGATIVE Final   Influenza B by PCR NEGATIVE NEGATIVE Final    Comment: (NOTE) The Xpert Xpress SARS-CoV-2/FLU/RSV plus assay is intended as an aid in the diagnosis of influenza from Nasopharyngeal swab specimens and should not be used as a sole basis for treatment. Nasal washings and aspirates are unacceptable for Xpert Xpress SARS-CoV-2/FLU/RSV testing.  Fact  Sheet for Patients: BloggerCourse.com  Fact Sheet for Healthcare Providers: SeriousBroker.it  This test is not yet approved or cleared by the [place] FDA and has been authorized for detection and/or diagnosis of SARS-CoV-2 by FDA under an Emergency Use Authorization (EUA). This EUA will remain in effect (meaning this test can be used) for the duration of the COVID-19 declaration under Section 564(b)(1) of the Act, 21 U.S.C. section 360bbb-3(b)(1), unless the authorization is terminated or revoked.  Performed at [facility], [address] [place], [place] [address]       Radiology Studies: DG Chest 2 View  Result Date: 11/20/2020 CLINICAL DATA:  Pleural effusion. EXAM: CHEST - 2 VIEW COMPARISON:  December 17, 21. FINDINGS: Similar moderate left and trace right pleural effusion.  Similar bibasilar opacities. Cardiac silhouette is largely obscured. Aortic atherosclerosis. Similar position of the left subclavian cardiac rhythm maintenance device. IMPRESSION: Similar moderate left and trace right pleural effusion. Similar bibasilar opacities, which could represent atelectasis, aspiration, and/or pneumonia. Electronically Signed   By: Feliberto Harts MD   On: 11/20/2020 13:37    Scheduled Meds: . colchicine  0.6 mg Oral BID  . ibuprofen  600 mg Oral TID  . lisinopril  10 mg Oral QHS  . mouth rinse  15 mL Mouth Rinse BID  . nitroGLYCERIN  0.4 mg Transdermal Daily  . pantoprazole  40 mg Oral Daily  . rivaroxaban  20 mg Oral Q supper  . rosuvastatin  5 mg Oral QHS  . sodium chloride flush  3 mL Intravenous Q12H   Continuous Infusions: . sodium chloride       LOS: 5 days   Time spent: 30 minutes   Keshawn Sundberg Estill Cotta, MD [facility]  If 7PM-7AM, please contact night-coverage [email or web address] 11/20/2020, 5:02 PM

## 2020-11-21 ENCOUNTER — Inpatient Hospital Stay (HOSPITAL_COMMUNITY): Payer: Medicare Other

## 2020-11-21 DIAGNOSIS — J90 Pleural effusion, not elsewhere classified: Secondary | ICD-10-CM

## 2020-11-21 LAB — CBC
HCT: 38.5 % — ABNORMAL LOW (ref 39.0–52.0)
Hemoglobin: 13.8 g/dL (ref 13.0–17.0)
MCH: 30.7 pg (ref 26.0–34.0)
MCHC: 35.8 g/dL (ref 30.0–36.0)
MCV: 85.7 fL (ref 80.0–100.0)
Platelets: 525 10*3/uL — ABNORMAL HIGH (ref 150–400)
RBC: 4.49 MIL/uL (ref 4.22–5.81)
RDW: 12.3 % (ref 11.5–15.5)
WBC: 9.1 10*3/uL (ref 4.0–10.5)
nRBC: 0 % (ref 0.0–0.2)

## 2020-11-21 LAB — BASIC METABOLIC PANEL
Anion gap: 9 (ref 5–15)
BUN: 16 mg/dL (ref 8–23)
CO2: 24 mmol/L (ref 22–32)
Calcium: 8 mg/dL — ABNORMAL LOW (ref 8.9–10.3)
Chloride: 92 mmol/L — ABNORMAL LOW (ref 98–111)
Creatinine, Ser: 0.87 mg/dL (ref 0.61–1.24)
GFR, Estimated: >60 mL/min (ref >60)
Glucose, Bld: 100 mg/dL — ABNORMAL HIGH (ref 70–99)
Potassium: 4.4 mmol/L (ref 3.5–5.1)
Sodium: 125 mmol/L — ABNORMAL LOW (ref 135–145)

## 2020-11-21 LAB — PROCALCITONIN: Procalcitonin: <0.1 ng/mL

## 2020-11-21 LAB — LACTATE DEHYDROGENASE: LDH: 194 U/L — ABNORMAL HIGH (ref 98–192)

## 2020-11-21 LAB — TSH: TSH: 2.956 u[IU]/mL (ref 0.350–4.500)

## 2020-11-21 MED ORDER — ADULT MULTIVITAMIN W/MINERALS CH
1.0000 | ORAL_TABLET | Freq: Every day | ORAL | Status: DC
  Administered 2020-11-21 – 2020-11-24 (×4): 1 via ORAL
  Filled 2020-11-21 (×4): qty 1

## 2020-11-21 MED ORDER — COSYNTROPIN 0.25 MG IJ SOLR
0.2500 mg | Freq: Once | INTRAMUSCULAR | Status: AC
  Administered 2020-11-22: 0.25 mg via INTRAVENOUS
  Filled 2020-11-21: qty 0.25

## 2020-11-21 NOTE — Consult Note (Signed)
NAME:  Ricky Hood, MRN:  [id number], DOB:  1946-11-06, LOS: 6 ADMISSION DATE:  11/15/2020, CONSULTATION DATE:  12/23 REFERRING MD:  cards, CHIEF COMPLAINT:  Pleural effusion    Brief History:  74yo male with hx HTN, PAF on flecainide and xarelto initially admitted 12/17 with acute diastolic heart failure, volume overload, pericardial effusion without tamponade. Course has been c/b hyponatremia with Na 127.  He has been treated with IV diuretic, ibuprofen, colchicine with some improvement in pericardial effusion and volume overload. PCCM consulted 12/23 for persistent L pleural effusion.   History of Present Illness:  74yo male with hx HTN, PAF on flecainide and xarelto initially admitted 12/17 with acute diastolic heart failure, volume overload, pericardial effusion without tamponade. Course has been c/b hyponatremia with Na 127.  He has been treated with IV diuretic, ibuprofen, colchicine with some improvement in pericardial effusion and volume overload. PCCM consulted 12/23 for persistent L pleural effusion.   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   Significant Hospital Events:    Consults:  Cardiology TRH  Procedures:   Significant Diagnostic Tests:  2D echo 12/18>> large pericardial effusion 2.6cm without tamponade physiology, EF 60-65%, grade 1 diastolic dysfunction, Mild MR, mod TR Limited echo 12/20>>> small-mod pericardial effusion, improved without evidence of tamponade  Micro Data:  Flu/Covid 12/17>>> NEG   Antimicrobials:    Interim History / Subjective:  Pt comfortable in bed on 2L [place]. Denies SOB, chest pain.   Objective   Blood pressure 138/72, pulse 78, temperature 98.7 F (37.1 C), temperature source Oral, resp. rate 19, height 5\' 7"  (1.702 m), weight 76 kg, SpO2 100 %.         Intake/Output Summary (Last 24 hours) at 11/21/2020 1201 Last data filed at 11/21/2020 0500 Gross per 24 hour  Intake 240 ml  Output 2050 ml  Net -1810 ml   Filed Weights   11/20/20 0441 11/20/20 0453 11/21/20 0500  Weight: 75.3 kg 75.3 kg 76 kg    Examination: General: wdwn male, NAD in bed  HENT: mm moist, no JVD  Lungs: resps even non labored on 2L [place], diminished L  Cardiovascular: s1s2 rrr Abdomen: soft, non tender, non distended  Extremities: warm and dry, no sig edema  Neuro: awake, alert, appropriate    Assessment & Plan:  L pleural effusion - in setting acute diastolic heart failure.  Also with pericardial effusion which is improved.  PLAN -  IR thoracentesis with fluid analysis  Would consider further attempts at lasix   Acute diastolic heart failure  PLAN -  Per cardiology  Diuresis as above   Hyponatremia  PLAN -  F/u chem     Labs   CBC: Recent Labs  Lab 11/15/20 0953 11/17/20 0121 11/21/20 0235  WBC 8.8 10.7* 9.1  HGB 12.0* 11.6* 13.8  HCT 35.6* 35.2* 38.5*  MCV 92.2 89.3 85.7  PLT 479* 478* 525*    Basic Metabolic Panel: Recent Labs  Lab 11/17/20 0121 11/18/20 0333 11/18/20 1233 11/19/20 0219 11/20/20 0244 11/21/20 0235  NA 134* 128* 127* 127* 127* 125*  K 4.0 4.0 4.0 4.1 4.9 4.4  CL 94* 92* 88* 90* 92* 92*  CO2 28 27 26 27 25 24   GLUCOSE 120* 115* 112* 110* 98 100*  BUN 19 17 20 17 16 16   CREATININE 1.07 0.90 0.84 0.81 0.86 0.87  CALCIUM  8.2* 8.0* 8.2* 8.2* 8.4* 8.0*  MG 2.0  --   --   --   --   --    GFR: Estimated Creatinine Clearance: 69.6 mL/min (by C-G formula based on SCr of 0.87 mg/dL). Recent Labs  Lab 11/15/20 0953 11/17/20 0121 11/21/20 0235  WBC 8.8 10.7* 9.1    Liver Function Tests: Recent Labs  Lab 11/15/20 1639  AST 22  ALT 48*  ALKPHOS 122  BILITOT 1.1  PROT 6.0*  ALBUMIN 2.3*   No results for input(s): LIPASE, AMYLASE in the last 168 hours. No results for input(s): AMMONIA in the last 168  hours.  ABG No results found for: PHART, PCO2ART, PO2ART, HCO3, TCO2, ACIDBASEDEF, O2SAT   Coagulation Profile: No results for input(s): INR, PROTIME in the last 168 hours.  Cardiac Enzymes: No results for input(s): CKTOTAL, CKMB, CKMBINDEX, TROPONINI in the last 168 hours.  HbA1C: No results found for: HGBA1C  CBG: No results for input(s): GLUCAP in the last 168 hours.  Review of Systems:   As per HPI - All other systems reviewed and were neg.    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   Surgical History:   Past Surgical History:  Procedure Laterality Date  . BIOPSY PROSTATE    . COLONOSCOPY    . PACEMAKER IMPLANT N/A 09/17/2020   SJM Assurity DR implanted by Dr Johney Frame for AV block and syncope     Social History:   reports that he has never smoked. He has never used smokeless tobacco. He reports that he does not drink alcohol and does not use drugs.   Family History:  His family history includes Hyperlipidemia in his father and mother; Hypertension in his father and mother; Other in his mother.   Allergies No Known Allergies   Home Medications  Prior to Admission medications   Medication Sig Start Date End Date Taking? Authorizing Provider  acetaminophen (TYLENOL) 325 MG tablet Take 325-650 mg by mouth daily as needed for headache (pain).   Yes [provider]  ascorbic acid (VITAMIN C) 500 MG tablet Take 500 mg by mouth daily after supper.   Yes [provider]  Cholecalciferol (VITAMIN D3) 50 MCG (2000 UT) TABS Take 2,000 Units by mouth daily after supper.   Yes [provider]  Coenzyme Q10 (COQ10) 200 MG CAPS Take 200 mg by mouth daily after supper.   Yes [provider]  finasteride (PROSCAR) 5 MG tablet Take 5 mg by mouth daily after supper.   Yes  [provider]  fish oil-omega-3 fatty acids 1000 MG capsule Take 3 g by mouth daily after supper.   Yes [provider]  flecainide (TAMBOCOR) 100 MG tablet Take 1 tablet (100 mg total) by mouth 2 (two) times daily. Patient taking differently: Take 100 mg by mouth 2 (two) times daily after a meal. 11/08/20  Yes Allred, Fayrene Fearing, MD  lisinopril (ZESTRIL) 10 MG tablet Take 10 mg by mouth daily after supper.   Yes [provider]  loratadine (CLARITIN) 10 MG tablet Take 10 mg by mouth daily after supper.   Yes [provider]  Multiple Vitamin (MULTIVITAMIN WITH MINERALS) TABS tablet Take 1 tablet by mouth daily after supper.   Yes [provider]  Psyllium (METAMUCIL FIBER PO) Take by mouth See admin instructions. Mix 1 heaping teaspoonful of powder  into 6-8 ounces of juice and drink in the morning and before supper   Yes [provider]  rivaroxaban (XARELTO) 20 MG TABS tablet Take 1 tablet (20 mg total) by mouth daily with supper. 11/08/20  Yes Allred, Fayrene Fearing, MD  rosuvastatin (CRESTOR) 5 MG tablet Take 5 mg by mouth daily after supper. 07/26/20  Yes [provider]  TART CHERRY PO Take 30 mLs by mouth 2 (two) times daily with a meal.   Yes [provider]       Dirk Dress, NP Pulmonary/Critical Care Medicine  11/21/2020  12:01 PM

## 2020-11-21 NOTE — TOC Initial Note (Signed)
Transition of Care [facility]) - Initial/Assessment Note    Patient Details  Name: Ricky Hood MRN: [id number] Date of Birth: 11/23/1946  Transition of Care [facility]) CM/SW Contact:    Gala Lewandowsky, RN Phone Number: 11/21/2020, 5:44 PM  Clinical Narrative:   Case Manager received a consult for home health heart failure referral. Prior to arrival patient was independent from home with spouse. Patient gets medications without any issues and has a primary care provider. Patient is currently wearing oxygen in the hospital and was not in the home. Case Manager will follow for additional transition of care needs.             Expected Discharge Plan: Home w Home Health Services Barriers to Discharge: Continued Medical Work up   Patient Goals and CMS Choice Patient states their goals for this hospitalization and ongoing recovery are:: to return home once stable.      Expected Discharge Plan and Services Expected Discharge Plan: Home w Home Health Services In-house Referral: NA Discharge Planning Services: CM Consult   Living arrangements for the past 2 months: Single Family Home                   DME Agency: NA     Prior Living Arrangements/Services Living arrangements for the past 2 months: Single Family Home Lives with:: Spouse Patient language and need for interpreter reviewed:: Yes Do you feel safe going back to the place where you live?: Yes      Need for Family Participation in Patient Care: Yes (Comment) Care giver support system in place?: Yes (comment)   Criminal Activity/Legal Involvement Pertinent to Current Situation/Hospitalization: No - Comment as needed  Activities of Daily Living Home Assistive Devices/Equipment: None ADL Screening (condition at time of admission) Patient's cognitive ability adequate to safely complete daily activities?: Yes Is the patient deaf or have difficulty hearing?: Yes Does the patient have difficulty seeing, even when wearing  glasses/contacts?: No Does the patient have difficulty concentrating, remembering, or making decisions?: No Patient able to express need for assistance with ADLs?: Yes Does the patient have difficulty dressing or bathing?: No Independently performs ADLs?: Yes (appropriate for developmental age) Does the patient have difficulty walking or climbing stairs?: No Weakness of Legs: None Weakness of Arms/Hands: None  Permission Sought/Granted Permission sought to share information with : Family Microbiologist    Emotional Assessment Appearance:: Appears stated age Attitude/Demeanor/Rapport: Engaged Affect (typically observed): Accepting Orientation: : Oriented to Self,Oriented to Place,Oriented to  Time,Oriented to Situation Alcohol / Substance Use: Not Applicable Psych Involvement: No (comment)  Admission diagnosis:  Acute diastolic heart failure (HCC) [I50.31] Acute pulmonary edema (HCC) [J81.0] Dyspnea on exertion [R06.00] Patient Active Problem List   Diagnosis Date Noted  . Pleural effusion   . Acute diastolic heart failure (HCC) 11/15/2020  . [employer] Assurity MRI conditional  dual-chamber pacemaker for symptomatic complete heart block   09/24/2020  . Complete heart block (HCC) 09/16/2020   PCP:  Marlyn Corporal, PA Pharmacy:   [facility]) [facility] - [place], [place] - [address] [place] [address] Phone: [phone] Fax: [phone]  [facility] - [place], [place] - [address] [place] [address] Phone: [phone] Fax: [phone]  Readmission Risk Interventions No flowsheet data found.

## 2020-11-21 NOTE — Progress Notes (Addendum)
Initial Nutrition Assessment  DOCUMENTATION CODES:   Not applicable  INTERVENTION:   MVI with minerals  If intake declines, plan to add supplement as necessary.   NUTRITION DIAGNOSIS:   Increased nutrient needs related to chronic illness (CHF) as evidenced by estimated needs.  GOAL:   Patient will meet greater than or equal to 90% of their needs  MONITOR:   PO intake,Weight trends,Labs  REASON FOR ASSESSMENT:   Consult Assessment of nutrition requirement/status  ASSESSMENT:   74 y.o. male with PMH of HTN and Afib and complete heart block s/p pacemaker 09/17/20. Admitted with acute diastolic heart failure, volume overload, and pericardial effusion. Hospital course c/b hyponatremia.  Spoke with pt at bedside with wife present. Pt reports his appetite has not been decreased recently. Typical intake: B: homemade smoothie consisting of milk, protein powder, yogurt, and applesauce and almonds, L: ham sandwich, D: grilled meat with vegetables. Snacks on almonds and apples. Pt reports drinking a lot of water throughout the day. Per chart review, pt consumed 100% of last 5 documented meal records.   Pt reports no recent weight loss but admits it might be hard to tell with significant edema lately. Pt reports he is very active, he likes walking and has a bicycle he uses frequently. Pt reports his UBW is around 166 lbs. Upon admission pt weighed 189 lbs but had significant fluid accumulation. Pt reports the fluid had been increasing steadily over the past week. Admit weight: 189 kg ,Current weight: 167.5 lbs.   Low prealbumin related to volume overload.   Per MD, plan for cort stim test tomorrow. Plan to provide education closer to d/c.   Labs reviewed: Sodium: 125, Chloride 92  Medications reviewed and include: Protonix, Lisinopril    NUTRITION - FOCUSED PHYSICAL EXAM:  Flowsheet Row Most Recent Value  Orbital Region No depletion  Upper Arm Region No depletion  Thoracic and  Lumbar Region No depletion  Buccal Region No depletion  Temple Region No depletion  Clavicle Bone Region Mild depletion  Clavicle and Acromion Bone Region Mild depletion  Scapular Bone Region No depletion  Dorsal Hand No depletion  Patellar Region Mild depletion  Anterior Thigh Region No depletion  Posterior Calf Region No depletion  Edema (RD Assessment) Mild  Hair Reviewed  Eyes Reviewed  Mouth Reviewed  Skin Reviewed  Nails Reviewed       Diet Order:   Diet Order            Diet 2 gram sodium Room service appropriate? Yes; Fluid consistency: Thin; Fluid restriction: 1500 mL Fluid  Diet effective now                 EDUCATION NEEDS:   Not appropriate for education at this time  Skin:  Skin Assessment: Reviewed RN Assessment  Last BM:  11/20/20  Height:   Ht Readings from Last 1 Encounters:  11/15/20 5\' 7"  (1.702 m)    Weight:   Wt Readings from Last 1 Encounters:  11/21/20 76 kg    Ideal Body Weight:  64.5 kg  BMI:  Body mass index is 26.24 kg/m.  Estimated Nutritional Needs:   Kcal:  1900-2100 kcal  Protein:  95-110 grams  Fluid:  1.5 L/day    11/23/20, Dietetic Intern Pager: [phone] If unavailable: [phone]

## 2020-11-21 NOTE — Progress Notes (Signed)
Pharmacy consult: possible medication induced hyponatremia  74 yo male with new onset diastolic HF. He was noted with hyponatremia and pharmacy consulted to look into possible medication causes. -Na= 131 on admission (11/15/20) and now 125  Possible medication causes include -ibuprofen 600mg  po tid (for pericarditis; plans noted for two weeks treatment): added 12/20 for  (Na was 127 at that time) -lisinopril: has been on this since at least 2017. Causation is doubtful  Summary -Ibuprofen may be contributing to hyponatremia but not likely the primary cause as sodium levels were already low when it started  Recommendations -Could consider d/c ibuprofen but this may not result in improvement in sodium levels -No other medications identified -Will sign off. Please contact pharmacy with any other needs.  Thank you.   Thank you for involving pharmacy in the care of this patient,   2018, PharmD Clinical Pharmacist **Pharmacist phone directory can now be found on [email or web address] ([email or web address]).  Listed under [facility].

## 2020-11-21 NOTE — Progress Notes (Addendum)
Progress Note  Patient Name: Ricky Hood Date of Encounter: 11/21/2020  [facility] Cardiologist: Garwin Brothers, MD   Subjective   Pt has no complaints, breathing is stable from yesterday. Sodium level continues to fall I'm concerned about adrenal failure Cort stim test ordered for tomorrow   Inpatient Medications    Scheduled Meds: . colchicine  0.6 mg Oral BID  . ibuprofen  600 mg Oral TID  . lisinopril  10 mg Oral QHS  . mouth rinse  15 mL Mouth Rinse BID  . nitroGLYCERIN  0.4 mg Transdermal Daily  . pantoprazole  40 mg Oral Daily  . rivaroxaban  20 mg Oral Q supper  . rosuvastatin  5 mg Oral QHS  . sodium chloride flush  3 mL Intravenous Q12H   Continuous Infusions: . sodium chloride     PRN Meds: sodium chloride, acetaminophen, ondansetron (ZOFRAN) IV, psyllium, sodium chloride flush   Vital Signs    Vitals:   11/20/20 1139 11/20/20 1145 11/20/20 2041 11/21/20 0500  BP: 113/64  (!) 152/75 138/72  Pulse: 75 75 78 78  Resp: (!) 32 (!) 22 20 19   Temp: 98.5 F (36.9 C) 98.5 F (36.9 C) (!) 97.5 F (36.4 C) 98.7 F (37.1 C)  TempSrc: Oral  Axillary Oral  SpO2: 95% 96% 95% 100%  Weight:    76 kg  Height:        Intake/Output Summary (Last 24 hours) at 11/21/2020 0643 Last data filed at 11/21/2020 0500 Gross per 24 hour  Intake 360 ml  Output 2050 ml  Net -1690 ml   Last 3 Weights 11/21/2020 11/20/2020 11/20/2020  Weight (lbs) 167 lb 8.8 oz 166 lb 1.6 oz 166 lb 1.6 oz  Weight (kg) 76 kg 75.342 kg 75.342 kg      Telemetry    Paced rhythm, PVCs, HRH 70s - Personally Reviewed  ECG    No new tracings - Personally Reviewed  Physical Exam   GEN: No acute distress.   Neck: No JVD Cardiac: RRR, no murmurs, rubs, or gallops.  Respiratory: diminished in left base GI: Soft, nontender, non-distended  MS: mild B LE edema. Neuro:  Nonfocal  Psych: Normal affect   Labs    High Sensitivity Troponin:   Recent Labs  Lab 11/15/20 1639   TROPONINIHS 18*      Chemistry Recent Labs  Lab 11/15/20 1639 11/16/20 0159 11/19/20 0219 11/20/20 0244 11/21/20 0235  NA  --    < > 127* 127* 125*  K  --    < > 4.1 4.9 4.4  CL  --    < > 90* 92* 92*  CO2  --    < > 27 25 24   GLUCOSE  --    < > 110* 98 100*  BUN  --    < > 17 16 16   CREATININE  --    < > 0.81 0.86 0.87  CALCIUM  --    < > 8.2* 8.4* 8.0*  PROT 6.0*  --   --   --   --   ALBUMIN 2.3*  --   --   --   --   AST 22  --   --   --   --   ALT 48*  --   --   --   --   ALKPHOS 122  --   --   --   --   BILITOT 1.1  --   --   --   --  GFRNONAA  --    < > >60 >60 >60  ANIONGAP  --    < > 10 10 9    < > = values in this interval not displayed.     Hematology Recent Labs  Lab 11/15/20 0953 11/17/20 0121 11/21/20 0235  WBC 8.8 10.7* 9.1  RBC 3.86* 3.94* 4.49  HGB 12.0* 11.6* 13.8  HCT 35.6* 35.2* 38.5*  MCV 92.2 89.3 85.7  MCH 31.1 29.4 30.7  MCHC 33.7 33.0 35.8  RDW 12.7 12.6 12.3  PLT 479* 478* 525*    BNP Recent Labs  Lab 11/16/20 0159  BNP 481.4*     DDimer No results for input(s): DDIMER in the last 168 hours.   Radiology    DG Chest 2 View  Result Date: 11/20/2020 CLINICAL DATA:  Pleural effusion. EXAM: CHEST - 2 VIEW COMPARISON:  December 17, 21. FINDINGS: Similar moderate left and trace right pleural effusion. Similar bibasilar opacities. Cardiac silhouette is largely obscured. Aortic atherosclerosis. Similar position of the left subclavian cardiac rhythm maintenance device. IMPRESSION: Similar moderate left and trace right pleural effusion. Similar bibasilar opacities, which could represent atelectasis, aspiration, and/or pneumonia. Electronically Signed   By: 08-13-1987 MD   On: 11/20/2020 13:37    Cardiac Studies   Echo limited 11/18/20: 1. Limited echo for pericardial effusion  2. Right ventricular systolic function is normal. The right ventricular  size is normal. There is normal pulmonary artery systolic pressure. The   estimated right ventricular systolic pressure is 33.8 mmHg.  3. Small to moderate-sized pericardial effusion. The pericardial effusion  is circumferential. There is no evidence of cardiac tamponade.  4. The aortic valve is tricuspid. Aortic valve regurgitation is trivial.  5. Aortic dilatation noted. There is mild dilatation of the aortic root,  measuring 41 mm. There is moderate dilatation of the ascending aorta,  measuring 45 mm.  6. The inferior vena cava is dilated in size with >50% respiratory  variability, suggesting right atrial pressure of 8 mmHg.  7. Left ventricular ejection fraction, by estimation, is 60 to 65%. The  left ventricle has normal function.    Echo 11/16/20: 1. There is a large pericardial effusion present measuring 2.6cm at end  diastole. Appears to be largest at the LV apex. The is about 25%  respiratory variation across the mitral valve. IVC is dilated but  collapses >50% with inspiration suggesting RAP  11/18/20. There is no RV/RA diastolic collapse. Overall, no tamponade  physiology present.  2. Left ventricular ejection fraction, by estimation, is 60 to 65%. The  left ventricle has normal function. The left ventricle has no regional  wall motion abnormalities. There is mild concentric left ventricular  hypertrophy. Left ventricular diastolic  parameters are consistent with Grade I diastolic dysfunction (impaired  relaxation).  3. Right ventricular systolic function is normal. The right ventricular  size is normal. There is moderately elevated pulmonary artery systolic  pressure.  4. The mitral valve is normal in structure. Mild mitral valve  regurgitation.  5. Tricuspid valve regurgitation is moderate.  6. The aortic valve is tricuspid. There is mild calcification of the  aortic valve. There is mild thickening of the aortic valve. Aortic valve  regurgitation is mild. Mild aortic valve sclerosis is present, with no  evidence of aortic valve  stenosis.  7. Aortic dilatation noted. There is mild dilatation of the aortic root,  measuring 43 mm. There is mild to moderate dilatation of the ascending  aorta, measuring 43 mm.  8. The inferior vena cava is dilated in size with >50% respiratory  variability, suggesting right atrial pressure of 8 mmHg.   Patient Profile     74 y.o. 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  Assessment & Plan    New onset acute diastolic heart failure - presented with shortness of breath and 21 lb weight gain - BNP 481 - CXR with left pleural effusion --> repeat CXR with continued moderate left pleural effusion   Moderate left pleural effusion - persistent - given sodium, unable to push diuretic  - have consulted PCCM for help with management of pleural effusion   Pericardial effusion - echo this admission with large pericardial effusion --> repeat limited echo 12/20 with improved effusion now small to moderate - no evidence of tamponade, has remained hemodynamically stable - sed rate 75m CRP 172 - medicine service was consulted to rule out rheumatological causes of effusion --> ANA, rheumatoid factor, and complement all WNL - started on ibuprofen 600 mg TID x 2 weeks, 0.6 mg BID colchicine x 3 months, 40 mg protonix - would repeat echo in 1-2 weeks   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 - ascending aorta measuring 45 mm   CBH s/p recent PPM (09/17/20) - telemetry with paced rhythm   Paroxysmal atrial fibrillation - diagnosed 11/08/20 and started on flecainide --> D/C'ed due t CHF - telemetry with paced rhythm - continue  xarelto This patients CHA2DS2-VASc Score and unadjusted Ischemic Stroke Rate (% per year) is equal to 3.2 % stroke rate/year from a score of 3 (HTN, CHF, age)   Hyponatremia - Na down to 125 today from 127 - per primary - diuretic still on hold   For questions or updates, please contact [facility] Please consult [email or web address] for contact info under        Signed, Marcelino Duster, PA  11/21/2020, 6:43 AM    Attending Note:   The patient was seen and examined.  Agree with assessment and plan as noted above.  Changes made to the above note as needed.  Patient seen and independently examined with Bettina Gavia, PA .   We discussed all aspects of the encounter. I agree with the assessment and plan as stated above.  1.  Hyponatremia:   Sodium level continues to fall. I have ordered a cort stim test tomorrow   2.  Pericardial effusion _  Small by last echo   3.  Pleural effusion:   Pulmonary to see.  Possible thoracentesis if it is large enough      I have spent a total of 40 minutes with patient reviewing hospital  notes , telemetry, EKGs, labs and examining patient as well as establishing an assessment and plan that was discussed with the patient. > 50% of time was spent in direct patient care.    Vesta Mixer, Montez Hageman., MD, [facility] 11/21/2020, 11:10 AM [address],  [address] Office [phone] Pager [phone]

## 2020-11-21 NOTE — Progress Notes (Signed)
PROGRESS NOTE    Ricky Hood  AVW:[id number] DOB: [birth year] DOA: 11/15/2020 PCP: Ricky Corporal, PA   Brief Narrative:  Ricky Hood is a 74 y.o. 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  He had recently been started on flecainide and Xarelto by Dr. Johney Frame on 11/08/2020 during a follow-up visit after interrogation of pacemaker noted episodes of atrial fibrillation.  His family noticed that he began looking puffier and reportedly had a 23 pound weight gain.  He follow-up in atrial fibrillation clinic and was noted to be short of breath and had been sent to the hospital for further evaluation.  X-ray imaging revealed pulmonary edema and BNP was elevated at 481.4.  He was admitted to the cardiology service and started on IV diuresis. Echocardiogram 12/18 revealed a large pericardial effusion without tamponade features.  He was started on ibuprofen and colchicine.  Since admission sodium levels had trended from 131 to 134, and then back down to 127.  Since being in the hospital is leg swelling has resolved and he feels like he is back to his normal weight.  Repeat echocardiogram from 12/20 showed reduced pericardial effusion.  Patient reports significant family history of rheumatoid arthritis in both of his parents as well as a son with juvenile rheumatoid arthritis.  He personally had never been diagnosed with rheumatoid arthritis.  He intermittently has some mild back pain.  Denies having any significant joint pains, skin changes, nodules, fevers, chills, falls, trauma history of TB, or recent sick contacts.  [place], but he lives in a residential neighborhood.  Patient also reports that he hikes, but the last take that he ever had to remove it was very small in over  3 years ago.  Patient reports that he had received his booster for COVID-19 on October 29.  He also reported prior history of having low sodium levels in the past for which he was advised by his primary care provider to increase his salt intake.   Assessment & Plan:   Diastolic heart failure: Acute.   -Patient presented with complaints of shortness of breath.  Chest x-ray showed patchy bilateral infiltrates concerning for edema with bilateral pleural effusions.  And BNP elevated at 481.4.   -Echocardiogram had revealed EF 60-65% with large pericardial effusion present without signs of cardiac tamponade.   -With IV diuretics patient had improvement in lower extremity swelling and weight trended back to near patient's reported baseline.   Pericardial effusion: Initially seen to have a large pericardial effusion without cardiac tamponade features on echocardiogram.  - Labs revealing sedimentation rate 70 and CRP 172.89 .  TSH: WNL -Patient was started on colchicine and ibuprofen.   -Repeat echocardiogram from 12/20 noting reduction in pericardial effusion size for which pericardiocentesis was not needed. -Rheumatoid factor: Complement level, ANA: All came back within normal limits. -Repeat echo in 1 to 2 weeks  Hyponatremia: Acute.  Sodium levels since admission 131->134->134-> 128->127->127-->125   -Urine sodium: 110, 72. 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  Moderate left pleural effusion: -Persistent.  Reviewed chest x-ray from this morning.  Cardiology consulted PCCM for  thoracentesis.  Complete heart block s/p PM -Continue to monitor on telemetry.  Paroxysmal atrial fibrillation on anticoagulation:CHA2DS2-VASc score =3.    Continue  Xarelto  Hypoalbuminemia: Acute.  On admission patient's albumin levels were noted to be low 2.3.  Prealbumin: 13.5. -Consult dietitian.  DVT prophylaxis: Xarelto Code Status: Full code Family Communication: Patient's wife present at bedside.  Plan of care discussed with patient in length and he verbalized understanding and agreed with it. Disposition Plan: To be determined   Procedures:   Echo  Antimicrobials:   None  Status is: Inpatient  Remains inpatient appropriate because:Persistent severe electrolyte disturbances   Dispo: The patient is from: Home              Anticipated d/c is to: Home              Anticipated d/c date is: 2 days              Patient currently is not medically stable to d/c.    Subjective: Patient seen and examined.  Sitting comfortably on the bed.  No new complaints.  Denies shortness of breath, chest pain, Naxen, orthopnea or PND.  Remained afebrile.  Objective: Vitals:   11/20/20 1139 11/20/20 1145 11/20/20 2041 11/21/20 0500  BP: 113/64  (!) 152/75 138/72  Pulse: 75 75 78 78  Resp: (!) 32 (!) 22 20 19   Temp: 98.5 F (36.9 C) 98.5 F (36.9 C) (!) 97.5 F (36.4 C) 98.7 F (37.1 C)  TempSrc: Oral  Axillary Oral  SpO2: 95% 96% 95% 100%  Weight:    76 kg  Height:        Intake/Output Summary (Last 24 hours) at 11/21/2020 1440 Last data filed at 11/21/2020 0500 Gross per 24 hour  Intake 240 ml  Output 2050 ml  Net -1810 ml   Filed Weights   11/20/20 0441 11/20/20 0453 11/21/20 0500  Weight: 75.3 kg 75.3 kg 76 kg    Examination:  General exam: Appears calm and comfortable, on room air, communicating well Respiratory system: Clear to auscultation. Respiratory effort normal. Cardiovascular system: S1 & S2 heard, RRR. No JVD, murmurs, rubs, gallops or clicks. No pedal edema. Gastrointestinal system: Abdomen is nondistended, soft and nontender. No organomegaly or masses felt. Normal bowel sounds heard. Central nervous system:  Alert and oriented. No focal neurological deficits. Extremities: Symmetric 5 x 5 power. Skin: No rashes, lesions or ulcers Psychiatry: Judgement and insight appear normal. Mood & affect appropriate.    Data Reviewed: I have personally reviewed following labs and imaging studies  CBC: Recent Labs  Lab 11/15/20 0953 11/17/20 0121 11/21/20 0235  WBC 8.8 10.7* 9.1  HGB 12.0* 11.6* 13.8  HCT 35.6* 35.2* 38.5*  MCV 92.2 89.3 85.7  PLT 479* 478* 525*   Basic Metabolic Panel: Recent Labs  Lab 11/17/20 0121 11/18/20 0333 11/18/20 1233 11/19/20 0219 11/20/20 0244 11/21/20 0235  NA 134* 128* 127* 127* 127* 125*  K 4.0 4.0 4.0 4.1 4.9 4.4  CL 94* 92* 88* 90* 92* 92*  CO2 28 27 26 27 25 24   GLUCOSE 120* 115* 112* 110* 98 100*  BUN 19 17 20 17 16 16   CREATININE 1.07 0.90 0.84 0.81 0.86 0.87  CALCIUM 8.2* 8.0* 8.2* 8.2* 8.4* 8.0*  MG 2.0  --   --   --   --   --    GFR: Estimated Creatinine Clearance: 69.6 mL/min (by C-G formula based on SCr of 0.87 mg/dL). Liver Function Tests:  Recent Labs  Lab 11/15/20 1639  AST 22  ALT 48*  ALKPHOS 122  BILITOT 1.1  PROT 6.0*  ALBUMIN 2.3*   No results for input(s): LIPASE, AMYLASE in the last 168 hours. No results for input(s): AMMONIA in the last 168 hours. Coagulation Profile: No results for input(s): INR, PROTIME in the last 168 hours. Cardiac Enzymes: No results for input(s): CKTOTAL, CKMB, CKMBINDEX, TROPONINI in the last 168 hours. BNP (last 3 results) No results for input(s): PROBNP in the last 8760 hours. HbA1C: No results for input(s): HGBA1C in the last 72 hours. CBG: No results for input(s): GLUCAP in the last 168 hours. Lipid Profile: No results for input(s): CHOL, HDL, LDLCALC, TRIG, CHOLHDL, LDLDIRECT in the last 72 hours. Thyroid Function Tests: Recent Labs    11/21/20 1259  TSH 2.956   Anemia Panel: No results for input(s): VITAMINB12, FOLATE, FERRITIN, TIBC, IRON, RETICCTPCT in the last 72 hours. Sepsis  Labs: No results for input(s): PROCALCITON, LATICACIDVEN in the last 168 hours.  Recent Results (from the past 240 hour(s))  Resp Panel by RT-PCR (Flu A&B, Covid) Nasopharyngeal Swab     Status: None   Collection Time: 11/15/20  5:30 PM   Specimen: Nasopharyngeal Swab; Nasopharyngeal(NP) swabs in vial transport medium  Result Value Ref Range Status   SARS Coronavirus 2 by RT PCR NEGATIVE NEGATIVE Final    Comment: (NOTE) SARS-CoV-2 target nucleic acids are NOT DETECTED.  The SARS-CoV-2 RNA is generally detectable in upper respiratory specimens during the acute phase of infection. The lowest concentration of SARS-CoV-2 viral copies this assay can detect is 138 copies/mL. A negative result does not preclude SARS-Cov-2 infection and should not be used as the sole basis for treatment or other patient management decisions. A negative result may occur with  improper specimen collection/handling, submission of specimen other than nasopharyngeal swab, presence of viral mutation(s) within the areas targeted by this assay, and inadequate number of viral copies(<138 copies/mL). A negative result must be combined with clinical observations, patient history, and epidemiological information. The expected result is Negative.  Fact Sheet for Patients:  BloggerCourse.com  Fact Sheet for Healthcare Providers:  SeriousBroker.it  This test is no t yet approved or cleared by the [place] FDA and  has been authorized for detection and/or diagnosis of SARS-CoV-2 by FDA under an Emergency Use Authorization (EUA). This EUA will remain  in effect (meaning this test can be used) for the duration of the COVID-19 declaration under Section 564(b)(1) of the Act, 21 U.S.C.section 360bbb-3(b)(1), unless the authorization is terminated  or revoked sooner.       Influenza A by PCR NEGATIVE NEGATIVE Final   Influenza B by PCR NEGATIVE NEGATIVE Final     Comment: (NOTE) The Xpert Xpress SARS-CoV-2/FLU/RSV plus assay is intended as an aid in the diagnosis of influenza from Nasopharyngeal swab specimens and should not be used as a sole basis for treatment. Nasal washings and aspirates are unacceptable for Xpert Xpress SARS-CoV-2/FLU/RSV testing.  Fact Sheet for Patients: BloggerCourse.com  Fact Sheet for Healthcare Providers: SeriousBroker.it  This test is not yet approved or cleared by the [place] FDA and has been authorized for detection and/or diagnosis of SARS-CoV-2 by FDA under an Emergency Use Authorization (EUA). This EUA will remain in effect (meaning this test can be used) for the duration of the COVID-19 declaration under Section 564(b)(1) of the Act, 21 U.S.C. section 360bbb-3(b)(1), unless the authorization is terminated or revoked.  Performed at [facility],  [address] [place], [place] [address]       Radiology Studies: DG Chest 2 View  Result Date: 11/20/2020 CLINICAL DATA:  Pleural effusion. EXAM: CHEST - 2 VIEW COMPARISON:  December 17, 21. FINDINGS: Similar moderate left and trace right pleural effusion. Similar bibasilar opacities. Cardiac silhouette is largely obscured. Aortic atherosclerosis. Similar position of the left subclavian cardiac rhythm maintenance device. IMPRESSION: Similar moderate left and trace right pleural effusion. Similar bibasilar opacities, which could represent atelectasis, aspiration, and/or pneumonia. Electronically Signed   By: Feliberto HartsFrederick S Jones MD   On: 11/20/2020 13:37    Scheduled Meds: . colchicine  0.6 mg Oral BID  . [START ON 11/22/2020] cosyntropin  0.25 mg Intravenous Once  . ibuprofen  600 mg Oral TID  . lisinopril  10 mg Oral QHS  . mouth rinse  15 mL Mouth Rinse BID  . nitroGLYCERIN  0.4 mg Transdermal Daily  . pantoprazole  40 mg Oral Daily  . rosuvastatin  5 mg Oral QHS  . sodium chloride flush  3 mL  Intravenous Q12H   Continuous Infusions: . sodium chloride       LOS: 6 days   Time spent: 30 minutes   Briannie Gutierrez Estill Cotta Daleiza Bacchi, MD [facility]  If 7PM-7AM, please contact night-coverage [email or web address] 11/21/2020, 2:40 PM

## 2020-11-22 LAB — BASIC METABOLIC PANEL
Anion gap: 14 (ref 5–15)
BUN: 20 mg/dL (ref 8–23)
CO2: 20 mmol/L — ABNORMAL LOW (ref 22–32)
Calcium: 8.5 mg/dL — ABNORMAL LOW (ref 8.9–10.3)
Chloride: 93 mmol/L — ABNORMAL LOW (ref 98–111)
Creatinine, Ser: 1.02 mg/dL (ref 0.61–1.24)
GFR, Estimated: >60 mL/min (ref >60)
Glucose, Bld: 129 mg/dL — ABNORMAL HIGH (ref 70–99)
Potassium: 4.3 mmol/L (ref 3.5–5.1)
Sodium: 127 mmol/L — ABNORMAL LOW (ref 135–145)

## 2020-11-22 LAB — HEPATIC FUNCTION PANEL
ALT: 43 U/L (ref 0–44)
AST: 23 U/L (ref 15–41)
Albumin: 2.3 g/dL — ABNORMAL LOW (ref 3.5–5.0)
Alkaline Phosphatase: 89 U/L (ref 38–126)
Bilirubin, Direct: 0.2 mg/dL (ref 0.0–0.2)
Indirect Bilirubin: 0.6 mg/dL (ref 0.3–0.9)
Total Bilirubin: 0.8 mg/dL (ref 0.3–1.2)
Total Protein: 6 g/dL — ABNORMAL LOW (ref 6.5–8.1)

## 2020-11-22 LAB — LACTATE DEHYDROGENASE: LDH: 201 U/L — ABNORMAL HIGH (ref 98–192)

## 2020-11-22 LAB — ACTH STIMULATION, 3 TIME POINTS
Cortisol, 30 Min: 49.5 ug/dL
Cortisol, 60 Min: 52.7 ug/dL
Cortisol, Base: 36.7 ug/dL

## 2020-11-22 MED ORDER — SODIUM CHLORIDE 1 G PO TABS
1.0000 g | ORAL_TABLET | Freq: Two times a day (BID) | ORAL | Status: AC
  Administered 2020-11-22 – 2020-11-23 (×2): 1 g via ORAL
  Filled 2020-11-22 (×2): qty 1

## 2020-11-22 NOTE — Progress Notes (Signed)
NAME:  Ricky Hood, MRN:  [id number], DOB:  10-May-1946, LOS: 7 ADMISSION DATE:  11/15/2020, CONSULTATION DATE:  12/23 REFERRING MD:  cards, CHIEF COMPLAINT:  Pleural effusion    Brief History:  74yo male with hx HTN, PAF on flecainide and xarelto initially admitted 12/17 with acute diastolic heart failure, volume overload, pericardial effusion without tamponade. Course has been c/b hyponatremia with Na 127.  He has been treated with IV diuretic, ibuprofen, colchicine with some improvement in pericardial effusion and volume overload. PCCM consulted 12/23 for persistent L pleural effusion.   History of Present Illness:  74yo male with hx HTN, PAF on flecainide and xarelto initially admitted 12/17 with acute diastolic heart failure, volume overload, pericardial effusion without tamponade. Course has been c/b hyponatremia with Na 127.  He has been treated with IV diuretic, ibuprofen, colchicine with some improvement in pericardial effusion and volume overload. PCCM consulted 12/23 for persistent L pleural effusion.   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   Significant Hospital Events:    Consults:  Cardiology [facility]  Procedures:   Significant Diagnostic Tests:  2D echo 12/18>> large pericardial effusion 2.6cm without tamponade physiology, EF 60-65%, grade 1 diastolic dysfunction, Mild MR, mod TR Limited echo 12/20>>> small-mod pericardial effusion, improved without evidence of tamponade  Micro Data:  Flu/Covid 12/17>>> NEG   Antimicrobials:    Interim History / Subjective:  Acute distress at rest awaits thoracentesis per interventional radiology Objective   Blood pressure 126/63, pulse 69, temperature 97.7 F (36.5 C), temperature source Oral, resp. rate 19, height 5\' 7"  (1.702 m), weight 76.5 kg, SpO2 96 %.         Intake/Output Summary (Last 24 hours) at 11/22/2020 1029 Last data filed at 11/22/2020 0500 Gross per 24 hour  Intake 240 ml  Output 850 ml  Net -610 ml   Filed Weights   11/20/20 0453 11/21/20 0500 11/22/20 0536  Weight: 75.3 kg 76 kg 76.5 kg    Examination: General: Awake alert currently on nasal BiPAP HENT: No JVD Lungs: Diminished breath sounds on the left currently on CPAP Cardiovascular: s1s2 rrr Abdomen: s soft nontender nondistended positive bowel sounds Extremities: Warm dry without edema Neuro: Grossly intact without focal defect   Assessment & Plan:  L pleural effusion - in setting acute diastolic heart failure.  Also with pericardial effusion which is improved.  PLAN -  Interventional radiology to do thoracentesis once he has been off anticoagulation long enough. Fluid analysis Sitter diuresis as tolerated   Acute diastolic heart failure  PLAN -  Per cardiology  Hyponatremia  Recent Labs  Lab 11/20/20 0244 11/21/20 0235 11/22/20 0602  NA 127* 125* 127*    PLAN -  Monitor sodium    Labs   CBC: Recent Labs  Lab 11/17/20 0121 11/21/20 0235  WBC 10.7* 9.1  HGB 11.6* 13.8  HCT 35.2* 38.5*  MCV 89.3 85.7  PLT 478* 525*    Basic Metabolic Panel: Recent Labs  Lab 11/17/20 0121 11/18/20 0333 11/18/20 1233 11/19/20 0219 11/20/20 0244 11/21/20 0235 11/22/20 0602  NA 134*   < > 127* 127* 127* 125* 127*  K 4.0   < > 4.0 4.1 4.9 4.4 4.3  CL 94*   < > 88* 90* 92* 92* 93*  CO2 28   < > 26 27 25 24  20*  GLUCOSE 120*   < >  112* 110* 98 100* 129*  BUN 19   < > 20 17 16 16 20   CREATININE 1.07   < > 0.84 0.81 0.86 0.87 1.02  CALCIUM 8.2*   < > 8.2* 8.2* 8.4* 8.0* 8.5*  MG 2.0  --   --   --   --   --   --    < > = values in this interval not displayed.   GFR: Estimated Creatinine Clearance: 59.4 mL/min (by C-G formula based on SCr of 1.02 mg/dL). Recent Labs  Lab 11/17/20 0121 11/21/20 0235  PROCALCITON  --  <0.10  WBC 10.7* 9.1     Liver Function Tests: Recent Labs  Lab 11/15/20 1639 11/22/20 0605  AST 22 23  ALT 48* 43  ALKPHOS 122 89  BILITOT 1.1 0.8  PROT 6.0* 6.0*  ALBUMIN 2.3* 2.3*   No results for input(s): LIPASE, AMYLASE in the last 168 hours. No results for input(s): AMMONIA in the last 168 hours.  ABG No results found for: PHART, PCO2ART, PO2ART, HCO3, TCO2, ACIDBASEDEF, O2SAT   Coagulation Profile: No results for input(s): INR, PROTIME in the last 168 hours.  Cardiac Enzymes: No results for input(s): CKTOTAL, CKMB, CKMBINDEX, TROPONINI in the last 168 hours.  HbA1C: No results found for: HGBA1C  CBG: No results for input(s): GLUCAP in the last 168 hours.     11/24/20 Shunna Mikaelian ACNP Acute Care Nurse Practitioner [facility] Pulmonary/Critical Care Please consult Amion 11/22/2020, 10:29 AM

## 2020-11-22 NOTE — Progress Notes (Signed)
PROGRESS NOTE    Ricky GentileStanley Domke  AVW:[id number] DOB: 01/04/1946 DOA: 11/15/2020 PCP: Marlyn CorporalYates, Kate H, PA   Brief Narrative:  Ricky Hood is a 74 y.o. 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  He had recently been started on flecainide and Xarelto by Dr. Johney FrameAllred on 11/08/2020 during a follow-up visit after interrogation of pacemaker noted episodes of atrial fibrillation.  His family noticed that he began looking puffier and reportedly had a 23 pound weight gain.  He follow-up in atrial fibrillation clinic and was noted to be short of breath and had been sent to the hospital for further evaluation.  X-ray imaging revealed pulmonary edema and BNP was elevated at 481.4.  He was admitted to the cardiology service and started on IV diuresis. Echocardiogram 12/18 revealed a large pericardial effusion without tamponade features.  He was started on ibuprofen and colchicine.  Since admission sodium levels had trended from 131 to 134, and then back down to 127.  Since being in the hospital is leg swelling has resolved and he feels like he is back to his normal weight.  Repeat echocardiogram from 12/20 showed reduced pericardial effusion.  Patient reports significant family history of rheumatoid arthritis in both of his parents as well as a son with juvenile rheumatoid arthritis.  He personally had never been diagnosed with rheumatoid arthritis.  He intermittently has some mild back pain.  Denies having any significant joint pains, skin changes, nodules, fevers, chills, falls, trauma history of TB, or recent sick contacts.  [place], but he lives in a residential neighborhood.  Patient also reports that he hikes, but the last take that he ever had to remove it was very small in over  3 years ago.  Patient reports that he had received his booster for COVID-19 on October 29.  He also reported prior history of having low sodium levels in the past for which he was advised by his primary care provider to increase his salt intake.   Assessment & Plan:   New onset Diastolic heart failure: Acute.   -Patient presented with complaints of shortness of breath.  Chest x-ray showed patchy bilateral infiltrates concerning for edema with bilateral pleural effusions. BNP elevated at 481.4.   -Echocardiogram had revealed EF 60-65% with large pericardial effusion present without signs of cardiac tamponade.   -With IV diuretics patient had improvement in lower extremity swelling and weight trended back to near patient's reported baseline.  -Cont. To hold Lasix due to hyponatremia  Pericardial effusion: Initially seen to have a large pericardial effusion without cardiac tamponade features on echocardiogram.  - Labs revealing sedimentation rate 70 and CRP 172.89 .  TSH: WNL -Patient was started on colchicine and ibuprofen.   -Repeat echocardiogram from 12/20 noting reduction in pericardial effusion size for which pericardiocentesis was not needed. -Rheumatoid factor: Complement level, ANA: All came back within normal limits. -Repeat echo in 1 to 2 weeks  Hyponatremia: Acute.  Sodium levels since admission 131->134->134-> 128->127->127-->125-->127 -Urine sodium: 110, 72. urine osmolarity: 586, 367 -Continue to hold IV Lasix and fluid restrict <1.5 L/day -Cosyntropin stimulation test: Increase in cortisol-no adrenal insufficiency -I think his hyponatremia is likely secondary to SIADH in the setting of lung pathology?-Trial of sodium chloride pills twice daily for 2 doses and repeat BMP tomorrow a.m.  Moderate left pleural effusion: -  Persistent.  Reviewed chest x-ray from this morning.  Cardiology consulted PCCM for thoracentesis.  IR to do thoracentesis once he has been off of  anticoagulation.  Complete heart block s/p PM -Continue to monitor on telemetry.  Paroxysmal atrial fibrillation on anticoagulation:CHA2DS2-VASc score =3.    Hold Xarelto-for total sentences.  Hypoalbuminemia: Acute.  On admission patient's albumin levels were noted to be low 2.3.  Prealbumin: 13.5. -Consult dietitian.  DVT prophylaxis: SCD Code Status: Full code Family Communication: Patient's wife present at bedside.  Plan of care discussed with patient in length and he verbalized understanding and agreed with it. Disposition Plan: To be determined   Procedures:   Echo  Antimicrobials:   None  Status is: Inpatient  Remains inpatient appropriate because:Persistent severe electrolyte disturbances   Dispo: The patient is from: Home              Anticipated d/c is to: Home              Anticipated d/c date is: 2 days              Patient currently is not medically stable to d/c.    Subjective: Patient seen and examined.  Resting comfortably on the bed has CPAP.  Wife at bedside.  Patient denies any new complaints.  No chest pain, shortness of breath, leg swelling, orthopnea or PND.  Objective: Vitals:   11/21/20 0500 11/21/20 1949 11/21/20 2214 11/22/20 0536  BP: 138/72 133/64  126/63  Pulse: 78 70 77 69  Resp: 19 (!) 24 (!) 21 19  Temp: 98.7 F (37.1 C) 97.7 F (36.5 C)  97.7 F (36.5 C)  TempSrc: Oral Oral  Oral  SpO2: 100% 97% 97% 96%  Weight: 76 kg   76.5 kg  Height:        Intake/Output Summary (Last 24 hours) at 11/22/2020 1552 Last data filed at 11/22/2020 0500 Gross per 24 hour  Intake 240 ml  Output 850 ml  Net -610 ml   Filed Weights   11/20/20 0453 11/21/20 0500 11/22/20 0536  Weight: 75.3 kg 76 kg 76.5 kg    Examination:  General exam: Appears calm and comfortable, on CPAP, communicating well Respiratory system: Decreased breath sound noted on the left base.  No wheezing, rhonchi or crackles.   Cardiovascular system: S1 & S2 heard,  RRR. No JVD, murmurs, rubs, gallops or clicks. No pedal edema. Gastrointestinal system: Abdomen is nondistended, soft and nontender. No organomegaly or masses felt. Normal bowel sounds heard. Central nervous system: Alert and oriented. No focal neurological deficits. Extremities: Symmetric 5 x 5 power. Skin: No rashes, lesions or ulcers Psychiatry: Judgement and insight appear normal. Mood & affect appropriate.    Data Reviewed: I have personally reviewed following labs and imaging studies  CBC: Recent Labs  Lab 11/17/20 0121 11/21/20 0235  WBC 10.7* 9.1  HGB 11.6* 13.8  HCT 35.2* 38.5*  MCV 89.3 85.7  PLT 478* 525*   Basic Metabolic Panel: Recent Labs  Lab 11/17/20 0121 11/18/20 0333 11/18/20 1233 11/19/20 0219 11/20/20 0244 11/21/20 0235 11/22/20 0602  NA 134*   < > 127* 127* 127* 125* 127*  K 4.0   < > 4.0 4.1 4.9 4.4 4.3  CL 94*   < > 88* 90* 92* 92* 93*  CO2 28   < > 26 27 25 24  20*  GLUCOSE 120*   < > 112* 110* 98 100* 129*  BUN 19   < > 20 17  16 16 20   CREATININE 1.07   < > 0.84 0.81 0.86 0.87 1.02  CALCIUM 8.2*   < > 8.2* 8.2* 8.4* 8.0* 8.5*  MG 2.0  --   --   --   --   --   --    < > = values in this interval not displayed.   GFR: Estimated Creatinine Clearance: 59.4 mL/min (by C-G formula based on SCr of 1.02 mg/dL). Liver Function Tests: Recent Labs  Lab 11/15/20 1639 11/22/20 0605  AST 22 23  ALT 48* 43  ALKPHOS 122 89  BILITOT 1.1 0.8  PROT 6.0* 6.0*  ALBUMIN 2.3* 2.3*   No results for input(s): LIPASE, AMYLASE in the last 168 hours. No results for input(s): AMMONIA in the last 168 hours. Coagulation Profile: No results for input(s): INR, PROTIME in the last 168 hours. Cardiac Enzymes: No results for input(s): CKTOTAL, CKMB, CKMBINDEX, TROPONINI in the last 168 hours. BNP (last 3 results) No results for input(s): PROBNP in the last 8760 hours. HbA1C: No results for input(s): HGBA1C in the last 72 hours. CBG: No results for input(s):  GLUCAP in the last 168 hours. Lipid Profile: No results for input(s): CHOL, HDL, LDLCALC, TRIG, CHOLHDL, LDLDIRECT in the last 72 hours. Thyroid Function Tests: Recent Labs    11/21/20 1259  TSH 2.956   Anemia Panel: No results for input(s): VITAMINB12, FOLATE, FERRITIN, TIBC, IRON, RETICCTPCT in the last 72 hours. Sepsis Labs: Recent Labs  Lab 11/21/20 0235  PROCALCITON <0.10    Recent Results (from the past 240 hour(s))  Resp Panel by RT-PCR (Flu A&B, Covid) Nasopharyngeal Swab     Status: None   Collection Time: 11/15/20  5:30 PM   Specimen: Nasopharyngeal Swab; Nasopharyngeal(NP) swabs in vial transport medium  Result Value Ref Range Status   SARS Coronavirus 2 by RT PCR NEGATIVE NEGATIVE Final    Comment: (NOTE) SARS-CoV-2 target nucleic acids are NOT DETECTED.  The SARS-CoV-2 RNA is generally detectable in upper respiratory specimens during the acute phase of infection. The lowest concentration of SARS-CoV-2 viral copies this assay can detect is 138 copies/mL. A negative result does not preclude SARS-Cov-2 infection and should not be used as the sole basis for treatment or other patient management decisions. A negative result may occur with  improper specimen collection/handling, submission of specimen other than nasopharyngeal swab, presence of viral mutation(s) within the areas targeted by this assay, and inadequate number of viral copies(<138 copies/mL). A negative result must be combined with clinical observations, patient history, and epidemiological information. The expected result is Negative.  Fact Sheet for Patients:  11/17/20  Fact Sheet for Healthcare Providers:  BloggerCourse.com  This test is no t yet approved or cleared by the [place] FDA and  has been authorized for detection and/or diagnosis of SARS-CoV-2 by FDA under an Emergency Use Authorization (EUA). This EUA will remain  in  effect (meaning this test can be used) for the duration of the COVID-19 declaration under Section 564(b)(1) of the Act, 21 U.S.C.section 360bbb-3(b)(1), unless the authorization is terminated  or revoked sooner.       Influenza A by PCR NEGATIVE NEGATIVE Final   Influenza B by PCR NEGATIVE NEGATIVE Final    Comment: (NOTE) The Xpert Xpress SARS-CoV-2/FLU/RSV plus assay is intended as an aid in the diagnosis of influenza from Nasopharyngeal swab specimens and should not be used as a sole basis for treatment. Nasal washings and aspirates are unacceptable for Xpert Xpress SARS-CoV-2/FLU/RSV testing.  Fact Sheet for Patients: BloggerCourse.com  Fact Sheet for Healthcare Providers: SeriousBroker.it  This test is not yet approved or cleared by the [place] FDA and has been authorized for detection and/or diagnosis of SARS-CoV-2 by FDA under an Emergency Use Authorization (EUA). This EUA will remain in effect (meaning this test can be used) for the duration of the COVID-19 declaration under Section 564(b)(1) of the Act, 21 U.S.C. section 360bbb-3(b)(1), unless the authorization is terminated or revoked.  Performed at [facility], [address] [place], [place] [address]       Radiology Studies: No results found.  Scheduled Meds: . colchicine  0.6 mg Oral BID  . ibuprofen  600 mg Oral TID  . lisinopril  10 mg Oral QHS  . mouth rinse  15 mL Mouth Rinse BID  . multivitamin with minerals  1 tablet Oral Daily  . nitroGLYCERIN  0.4 mg Transdermal Daily  . pantoprazole  40 mg Oral Daily  . rosuvastatin  5 mg Oral QHS  . sodium chloride flush  3 mL Intravenous Q12H  . sodium chloride  1 g Oral BID WC   Continuous Infusions: . sodium chloride       LOS: 7 days   Time spent: 30 minutes   Emmeline Winebarger Estill Cotta, MD [facility]  If 7PM-7AM, please contact night-coverage [email or web address] 11/22/2020, 3:52 PM

## 2020-11-22 NOTE — Progress Notes (Signed)
Progress Note  Patient Name: Ricky Hood Date of Encounter: 11/22/2020  [facility] Cardiologist: Garwin Brothers, MD   Subjective   Pt has no complaints, breathing is stable from yesterday. Sodium level continues to fall  Cort stim test showed some increase in cortisol levels Will defer to Internal medicine team as to make further recs.   I appreciate Randon Goldsmith, NP's (Pulmonary )  assistance .   The plan is for IR to perform thoracentesis   Inpatient Medications    Scheduled Meds: . colchicine  0.6 mg Oral BID  . ibuprofen  600 mg Oral TID  . lisinopril  10 mg Oral QHS  . mouth rinse  15 mL Mouth Rinse BID  . multivitamin with minerals  1 tablet Oral Daily  . nitroGLYCERIN  0.4 mg Transdermal Daily  . pantoprazole  40 mg Oral Daily  . rosuvastatin  5 mg Oral QHS  . sodium chloride flush  3 mL Intravenous Q12H   Continuous Infusions: . sodium chloride     PRN Meds: sodium chloride, acetaminophen, ondansetron (ZOFRAN) IV, psyllium, sodium chloride flush   Vital Signs    Vitals:   11/21/20 0500 11/21/20 1949 11/21/20 2214 11/22/20 0536  BP: 138/72 133/64  126/63  Pulse: 78 70 77 69  Resp: 19 (!) 24 (!) 21 19  Temp: 98.7 F (37.1 C) 97.7 F (36.5 C)  97.7 F (36.5 C)  TempSrc: Oral Oral  Oral  SpO2: 100% 97% 97% 96%  Weight: 76 kg   76.5 kg  Height:        Intake/Output Summary (Last 24 hours) at 11/22/2020 0919 Last data filed at 11/22/2020 0500 Gross per 24 hour  Intake 240 ml  Output 850 ml  Net -610 ml   Last 3 Weights 11/22/2020 11/21/2020 11/20/2020  Weight (lbs) 168 lb 10.4 oz 167 lb 8.8 oz 166 lb 1.6 oz  Weight (kg) 76.5 kg 76 kg 75.342 kg      Telemetry    V Paced  - Personally Reviewed  ECG    No new tracings - Personally Reviewed  Physical Exam  Physical Exam: Blood pressure 126/63, pulse 69, temperature 97.7 F (36.5 C), temperature source Oral, resp. rate 19, height 5\' 7"  (1.702 m), weight 76.5 kg, SpO2 96  %.  GEN:  Well nourished, well developed in no acute distress HEENT: Normal NECK: No JVD; No carotid bruits LYMPHATICS: No lymphadenopathy CARDIAC: RRR , no murmurs, rubs, gallops RESPIRATORY:  Reduced breath sounds in left base ABDOMEN: Soft, non-tender, non-distended MUSCULOSKELETAL:  No edema; No deformity  SKIN: Warm and dry NEUROLOGIC:  Alert and oriented x 3   Labs    High Sensitivity Troponin:   Recent Labs  Lab 11/15/20 1639  TROPONINIHS 18*      Chemistry Recent Labs  Lab 11/15/20 1639 11/16/20 0159 11/19/20 0219 11/20/20 0244 11/21/20 0235 11/22/20 0605  NA  --    < > 127* 127* 125*  --   K  --    < > 4.1 4.9 4.4  --   CL  --    < > 90* 92* 92*  --   CO2  --    < > 27 25 24   --   GLUCOSE  --    < > 110* 98 100*  --   BUN  --    < > 17 16 16   --   CREATININE  --    < > 0.81 0.86 0.87  --  CALCIUM  --    < > 8.2* 8.4* 8.0*  --   PROT 6.0*  --   --   --   --  6.0*  ALBUMIN 2.3*  --   --   --   --  2.3*  AST 22  --   --   --   --  23  ALT 48*  --   --   --   --  43  ALKPHOS 122  --   --   --   --  89  BILITOT 1.1  --   --   --   --  0.8  GFRNONAA  --    < > >60 >60 >60  --   ANIONGAP  --    < > 10 10 9   --    < > = values in this interval not displayed.     Hematology Recent Labs  Lab 11/15/20 0953 11/17/20 0121 11/21/20 0235  WBC 8.8 10.7* 9.1  RBC 3.86* 3.94* 4.49  HGB 12.0* 11.6* 13.8  HCT 35.6* 35.2* 38.5*  MCV 92.2 89.3 85.7  MCH 31.1 29.4 30.7  MCHC 33.7 33.0 35.8  RDW 12.7 12.6 12.3  PLT 479* 478* 525*    BNP Recent Labs  Lab 11/16/20 0159  BNP 481.4*     DDimer No results for input(s): DDIMER in the last 168 hours.   Radiology    DG Chest 2 View  Result Date: 11/20/2020 CLINICAL DATA:  Pleural effusion. EXAM: CHEST - 2 VIEW COMPARISON:  December 17, 21. FINDINGS: Similar moderate left and trace right pleural effusion. Similar bibasilar opacities. Cardiac silhouette is largely obscured. Aortic atherosclerosis. Similar  position of the left subclavian cardiac rhythm maintenance device. IMPRESSION: Similar moderate left and trace right pleural effusion. Similar bibasilar opacities, which could represent atelectasis, aspiration, and/or pneumonia. Electronically Signed   By: 08[birth year]1987 MD   On: 11/20/2020 13:37    Cardiac Studies   Echo limited 11/18/20: 1. Limited echo for pericardial effusion  2. Right ventricular systolic function is normal. The right ventricular  size is normal. There is normal pulmonary artery systolic pressure. The  estimated right ventricular systolic pressure is 33.8 mmHg.  3. Small to moderate-sized pericardial effusion. The pericardial effusion  is circumferential. There is no evidence of cardiac tamponade.  4. The aortic valve is tricuspid. Aortic valve regurgitation is trivial.  5. Aortic dilatation noted. There is mild dilatation of the aortic root,  measuring 41 mm. There is moderate dilatation of the ascending aorta,  measuring 45 mm.  6. The inferior vena cava is dilated in size with >50% respiratory  variability, suggesting right atrial pressure of 8 mmHg.  7. Left ventricular ejection fraction, by estimation, is 60 to 65%. The  left ventricle has normal function.    Echo 11/16/20: 1. There is a large pericardial effusion present measuring 2.6cm at end  diastole. Appears to be largest at the LV apex. The is about 25%  respiratory variation across the mitral valve. IVC is dilated but  collapses >50% with inspiration suggesting RAP  11/18/20. There is no RV/RA diastolic collapse. Overall, no tamponade  physiology present.  2. Left ventricular ejection fraction, by estimation, is 60 to 65%. The  left ventricle has normal function. The left ventricle has no regional  wall motion abnormalities. There is mild concentric left ventricular  hypertrophy. Left ventricular diastolic  parameters are consistent with Grade I diastolic dysfunction (impaired   relaxation).  3. Right  ventricular systolic function is normal. The right ventricular  size is normal. There is moderately elevated pulmonary artery systolic  pressure.  4. The mitral valve is normal in structure. Mild mitral valve  regurgitation.  5. Tricuspid valve regurgitation is moderate.  6. The aortic valve is tricuspid. There is mild calcification of the  aortic valve. There is mild thickening of the aortic valve. Aortic valve  regurgitation is mild. Mild aortic valve sclerosis is present, with no  evidence of aortic valve stenosis.  7. Aortic dilatation noted. There is mild dilatation of the aortic root,  measuring 43 mm. There is mild to moderate dilatation of the ascending  aorta, measuring 43 mm.  8. The inferior vena cava is dilated in size with >50% respiratory  variability, suggesting right atrial pressure of 8 mmHg.   Patient Profile     74 y.o. 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  Assessment & Plan    New onset acute diastolic heart failure We have stopped lasix due to hyponatremia . Continue to follow   Moderate left pleural effusion Pulmonary is seeing   Pericardial effusion His pericardial effusion has greatly decreased on colchicine and motrin No clear etiology     Ascending aorta dilation Aortic root dilation Will continue to follow       Paroxysmal atrial fibrillation - diagnosed 11/08/20 and started on flecainide --> D/C'ed due t CHF   Hyponatremia Sodium level is 127 Cort stim test showed some increase in cortisol  This appears to indicate adequate adrenal function but I would defer to Int. Med team for full  interpretation   For questions or updates, please contact [facility] Please consult [email or web address] for contact info under        Signed, Kristeen Miss, MD  11/22/2020, 9:19 AM    Attending Note:   The patient was seen and examined.  Agree with assessment and plan as noted above.  Changes made to the above note as needed.  Patient seen and independently examined with Bettina Gavia, PA .   We discussed all aspects of the encounter. I agree with the assessment and plan as stated above.  1.  Hyponatremia:   Sodium level continues to fall. I have ordered a cort stim test tomorrow   2.  Pericardial effusion _  Small by last echo   3.  Pleural effusion:   Pulmonary to see.  Possible thoracentesis if it is large enough      I have spent a total of 40 minutes with patient reviewing hospital  notes , telemetry, EKGs, labs and examining patient as well as establishing an assessment and plan that was discussed with the patient. > 50% of time was spent in direct patient care.    Vesta Mixer, Montez Hageman., MD, [facility] 11/22/2020, 9:19 AM [address]  [address] Office [phone] Pager [phone]

## 2020-11-23 ENCOUNTER — Inpatient Hospital Stay (HOSPITAL_COMMUNITY): Payer: Medicare Other

## 2020-11-23 LAB — BASIC METABOLIC PANEL
Anion gap: 9 (ref 5–15)
BUN: 26 mg/dL — ABNORMAL HIGH (ref 8–23)
CO2: 25 mmol/L (ref 22–32)
Calcium: 8.4 mg/dL — ABNORMAL LOW (ref 8.9–10.3)
Chloride: 94 mmol/L — ABNORMAL LOW (ref 98–111)
Creatinine, Ser: 1.01 mg/dL (ref 0.61–1.24)
GFR, Estimated: >60 mL/min (ref >60)
Glucose, Bld: 111 mg/dL — ABNORMAL HIGH (ref 70–99)
Potassium: 4.6 mmol/L (ref 3.5–5.1)
Sodium: 128 mmol/L — ABNORMAL LOW (ref 135–145)

## 2020-11-23 LAB — APTT: aPTT: 34 seconds (ref 24–36)

## 2020-11-23 LAB — HEPARIN LEVEL (UNFRACTIONATED): Heparin Unfractionated: 0.17 IU/mL — ABNORMAL LOW (ref 0.30–0.70)

## 2020-11-23 MED ORDER — SODIUM CHLORIDE 1 G PO TABS
1.0000 g | ORAL_TABLET | Freq: Two times a day (BID) | ORAL | Status: DC
  Administered 2020-11-23 – 2020-11-24 (×2): 1 g via ORAL
  Filled 2020-11-23 (×3): qty 1

## 2020-11-23 MED ORDER — HEPARIN (PORCINE) 25000 UT/250ML-% IV SOLN
1450.0000 [IU]/h | INTRAVENOUS | Status: DC
  Administered 2020-11-23: 1100 [IU]/h via INTRAVENOUS
  Administered 2020-11-24: 1300 [IU]/h via INTRAVENOUS
  Filled 2020-11-23 (×2): qty 250

## 2020-11-23 NOTE — Progress Notes (Signed)
Pt places self on/off cpap as needed. RT will monitor. 

## 2020-11-23 NOTE — Progress Notes (Signed)
ANTICOAGULATION CONSULT NOTE - Initial Consult  Pharmacy Consult for Heparin Indication: atrial fibrillation  No Known Allergies  Patient Measurements: Height: 5\' 7"  (170.2 cm) Weight: 74.8 kg (164 lb 14.4 oz) IBW/kg (Calculated) : 66.1 Heparin Dosing Weight: 74.8 kg  Vital Signs: Temp: 98.2 F (36.8 C) (12/25 0458) Temp Source: Oral (12/25 0458) BP: 113/61 (12/25 0458) Pulse Rate: 70 (12/25 0458)  Labs: Recent Labs    11/21/20 0235 11/22/20 0602 11/23/20 0152  HGB 13.8  --   --   HCT 38.5*  --   --   PLT 525*  --   --   CREATININE 0.87 1.02 1.01    Estimated Creatinine Clearance: 60 mL/min (by C-G formula based on SCr of 1.01 mg/dL).   Medical History: Past Medical History:  Diagnosis Date  . Hypercholesteremia   . Hypertension   . Paroxysmal atrial fibrillation (HCC)   . 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 Second degree AV block     Medications:  Infusions:  . sodium chloride      Assessment: 74 y.o. male with history of paroxysmal atrial fibrillation on Xarelto prior to admission. Xarelto has been held (last dose 12/22) for thoracentesis. Pharmacy has been consulted to start IV heparin since it does not sound like procedure will be done today, per cards.   Will start heparin with no bolus per cards. Will order aPTT and heparin levels for now until levels correlate as Xarelto may influence heparin level. Hgb wnl on last CBC 12/23.    Goal of Therapy:  Heparin level 0.3-0.7 units/ml aPTT 66-102 seconds Monitor platelets by anticoagulation protocol: Yes   Plan:  Start heparin infusion at 1100 units/hr Check heparin level/aPTT in 8 hours and daily while on heparin Continue to monitor H&H and platelets   1/24, PharmD PGY1 Pharmacy Resident 11/23/2020 9:23 AM  Please check [email or web address] for unit-specific pharmacy phone numbers.

## 2020-11-23 NOTE — Progress Notes (Addendum)
Progress Note  Patient Name: Ricky Hood Date of Encounter: 11/23/2020  [facility] Cardiologist: Garwin Brothers, MD   Subjective   No acute overnight events. Patient denies any shortness of breath, chest pain, palpitations, or lower extremity edema.  Inpatient Medications    Scheduled Meds:  colchicine  0.6 mg Oral BID   ibuprofen  600 mg Oral TID   lisinopril  10 mg Oral QHS   mouth rinse  15 mL Mouth Rinse BID   multivitamin with minerals  1 tablet Oral Daily   nitroGLYCERIN  0.4 mg Transdermal Daily   pantoprazole  40 mg Oral Daily   rosuvastatin  5 mg Oral QHS   sodium chloride flush  3 mL Intravenous Q12H   Continuous Infusions:  sodium chloride     PRN Meds: sodium chloride, acetaminophen, ondansetron (ZOFRAN) IV, psyllium, sodium chloride flush   Vital Signs    Vitals:   11/22/20 0536 11/22/20 1946 11/22/20 2037 11/23/20 0458  BP: 126/63  126/69 113/61  Pulse: 69  80 70  Resp: 19 20 (!) 24 14  Temp: 97.7 F (36.5 C)  98.2 F (36.8 C) 98.2 F (36.8 C)  TempSrc: Oral  Oral Oral  SpO2: 96%  95%   Weight: 76.5 kg   74.8 kg  Height:        Intake/Output Summary (Last 24 hours) at 11/23/2020 0846 Last data filed at 11/22/2020 1807 Gross per 24 hour  Intake 180 ml  Output --  Net 180 ml   Last 3 Weights 11/23/2020 11/22/2020 11/21/2020  Weight (lbs) 164 lb 14.4 oz 168 lb 10.4 oz 167 lb 8.8 oz  Weight (kg) 74.798 kg 76.5 kg 76 kg      Telemetry    Ventricular paced with underlying sinus rhythm.  - Personally Reviewed  ECG    No new ECG tracing today. - Personally Reviewed  Physical Exam   GEN: No acute distress.   Neck: No JVD. Cardiac: RRR. No murmurs, rubs, or gallops.  Respiratory:  Reduced breath sound in left base but improved from earlier this week. No crackles, wheezes, or rhonchi appreciated. GI: Soft, non-tender, non-distended. MS: No lower extremity edema. No deformity. Skin: Warm and dry. Neuro:  No focal  deficits. Psych: Normal affect.  Labs    High Sensitivity Troponin:   Recent Labs  Lab 11/15/20 1639  TROPONINIHS 18*      Chemistry Recent Labs  Lab 11/21/20 0235 11/22/20 0602 11/22/20 0605 11/23/20 0152  NA 125* 127*  --  128*  K 4.4 4.3  --  4.6  CL 92* 93*  --  94*  CO2 24 20*  --  25  GLUCOSE 100* 129*  --  111*  BUN 16 20  --  26*  CREATININE 0.87 1.02  --  1.01  CALCIUM 8.0* 8.5*  --  8.4*  PROT  --   --  6.0*  --   ALBUMIN  --   --  2.3*  --   AST  --   --  23  --   ALT  --   --  43  --   ALKPHOS  --   --  89  --   BILITOT  --   --  0.8  --   GFRNONAA >60 >60  --  >60  ANIONGAP 9 14  --  9     Hematology Recent Labs  Lab 11/17/20 0121 11/21/20 0235  WBC 10.7* 9.1  RBC 3.94* 4.49  HGB 11.6* 13.8  HCT 35.2* 38.5*  MCV 89.3 85.7  MCH 29.4 30.7  MCHC 33.0 35.8  RDW 12.6 12.3  PLT 478* 525*    BNPNo results for input(s): BNP, PROBNP in the last 168 hours.   DDimer No results for input(s): DDIMER in the last 168 hours.   Radiology    No results found.  Cardiac Studies   Complete Echocardiogram 11/16/2020: Impressions: 1. There is a large pericardial effusion present measuring 2.6cm at end  diastole. Appears to be largest at the LV apex. The is about 25%  respiratory variation across the mitral valve. IVC is dilated but  collapses >50% with inspiration suggesting RAP  . There is no RV/RA diastolic collapse. Overall, no tamponade  physiology present.   2. Left ventricular ejection fraction, by estimation, is 60 to 65%. The  left ventricle has normal function. The left ventricle has no regional  wall motion abnormalities. There is mild concentric left ventricular  hypertrophy. Left ventricular diastolic  parameters are consistent with Grade I diastolic dysfunction (impaired  relaxation).   3. Right ventricular systolic function is normal. The right ventricular  size is normal. There is moderately elevated pulmonary artery systolic   pressure.   4. The mitral valve is normal in structure. Mild mitral valve  regurgitation.   5. Tricuspid valve regurgitation is moderate.   6. The aortic valve is tricuspid. There is mild calcification of the  aortic valve. There is mild thickening of the aortic valve. Aortic valve  regurgitation is mild. Mild aortic valve sclerosis is present, with no  evidence of aortic valve stenosis.   7. Aortic dilatation noted. There is mild dilatation of the aortic root,  measuring 43 mm. There is mild to moderate dilatation of the ascending  aorta, measuring 43 mm.   8. The inferior vena cava is dilated in size with >50% respiratory  variability, suggesting right atrial pressure of 8 mmHg.   Comparison(s): Compared to prior echo on 09/17/20, there is now a large  pericardial effusion present. Findings discussed with Dr. Wyline Mood.  _______________   Limited Echocardiogram 11/18/2020: Impressions:  1. Limited echo for pericardial effusion   2. Right ventricular systolic function is normal. The right ventricular  size is normal. There is normal pulmonary artery systolic pressure. The  estimated right ventricular systolic pressure is 33.8 mmHg.   3. Small to moderate-sized pericardial effusion. The pericardial effusion  is circumferential. There is no evidence of cardiac tamponade.   4. The aortic valve is tricuspid. Aortic valve regurgitation is trivial.   5. Aortic dilatation noted. There is mild dilatation of the aortic root,  measuring 41 mm. There is moderate dilatation of the ascending aorta,  measuring 45 mm.   6. The inferior vena cava is dilated in size with >50% respiratory  variability, suggesting right atrial pressure of 8 mmHg.   7. Left ventricular ejection fraction, by estimation, is 60 to 65%. The  left ventricle has normal function.   Comparison(s): Changes from prior study are noted. 11/16/2020: LVEF  60-65%, large pericardial effusion. Compared to the prior study, the   pericardial effusion is smaller.   Patient Profile     74 y.o. 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  Assessment & Plan    New Onset Acute Diastolic CHF - Presented with shortness of breath and 21lb weight gain.  - BNP elevated at 481. - Chest x-ray showed patchy bilateral pulmonary opacities and new moderate left and trace right pleural effusions. - Echo showed LVEF of 60-65% with normal wall motion, grade 1 diastolic dysfunction, mild MR, moderate TR, moderately elevated PASP as well as a new large pericardial effusion. - Initially diuresed with IV Lasix but this was stopped due to hyponatremia. Last dose 11/18/2020. Net negative 15.7 L this admission.  - Appears euvolemic on exam. - Continue to monitor daily weights, strict I/O's, and renal function.   Pericardial Effusion - Echo this admission showed large pericardial effusion measuring 2.6cm at end diastole. Largest at the LV apex. No evidence of tamponade. This was new from Echo in 08/2020. Repeat Echo on 12/20 showed improvement in effusion - considered small to moderate. - Patient is hemodynamically stable. - TSH normal. - Sed rate elevated at 70. High sensitivity CRP elevated at 172.  - ANA, rheumatoid factor, complement total normal. - He was started on Ibuprofen and Colchicine.  Left Pleural Effusion - Repeat chest x-ray on 11/20/2020 showed moderate left pleural effusion and trace right pleural effusion. - PCCM was consulted and planning thoracentesis.  - Will repeat chest x-ray today.   Paroxysmal Atrial Fibrillation - Diagnosed on 11/08/2020 and started on Flecainide which has now been stopped in setting  of CHF.  - Telemetry shows ventricular paced rhythm with underlying sinus rhythm.  - Xarelto currently being held for thoracentesis. Last dose of Xarelto was 11/20/2020. Will start IV Heparin since it does not sound like procedure will be done today.   Complete Heart Block s/p PPM - Followed by Dr. Johney Frame.  Dilated Aortic Root/ Ascending Aorta - Echo showed aortic root measuring 9mm and ascending aorta measuring 17mm.  - Consider chest CTA as outpatient for further evaluation.   Hypertension - BP well controlled. - Continue home Lisinopril 10mg  daily.   Hyperlipidemia - Continue home Crestor.   Hyponatremia - Sodium stable at 128. Nadir 125 on 11/21/2020.  - Urine sodium 110 >> 72. Urine Osmolality 586 >> 367. - Cosyuntropin stimulation test showed increase in cortisol (no adrenal insufficiency).  - [facility] was consulted and is helping with this. Dr. 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  - He was started on sodium chloride pills yesterday. - Management per [facility].     For questions or updates, please contact [facility] Please consult [email or web address] for contact info under        Signed, Jacqulyn Bath, PA-C  11/23/2020, 8:46 AM    Agree with note by 11/25/2020, PA-C  Mr. Stuck has had excellent diuresis.  His pericardial effusion has somewhat improved.  He appears dry on exam.  Primary service is treating his hyponatremia.  He apparently has a large left pleural effusion is scheduled for thoracentesis by IR.  He has chronic A. fib status post pacemaker implantation.  Eliquis is on hold.  We will start IV heparin.  Await thoracentesis after which we can restart him on his Eliquis oral anticoagulation.   Rod Can, M.D., FACP, [facility], [facility] [address] [place], [place]  [place]  [phone] 11/23/2020 9:39 AM

## 2020-11-23 NOTE — Progress Notes (Signed)
PROGRESS NOTE    Ricky Hood  ZOX:[id number] DOB: Nov 04, 1946 DOA: 11/15/2020 PCP: Ricky CorporalYates, Kate H, PA   Brief Narrative:  Ricky Hood is a 74 y.o. 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  He had recently been started on flecainide and Xarelto by Ricky Hood on 11/08/2020 during a follow-up visit after interrogation of pacemaker noted episodes of atrial fibrillation.  His family noticed that he began looking puffier and reportedly had a 23 pound weight gain.  He follow-up in atrial fibrillation clinic and was noted to be short of breath and had been sent to the hospital for further evaluation.  X-ray imaging revealed pulmonary edema and BNP was elevated at 481.4.  He was admitted to the cardiology service and started on IV diuresis. Echocardiogram 12/18 revealed a large pericardial effusion without tamponade features.  He was started on ibuprofen and colchicine.  Since admission sodium levels had trended from 131 to 134, and then back down to 127.  Since being in the hospital is leg swelling has resolved and he feels like he is back to his normal weight.  Repeat echocardiogram from 12/20 showed reduced pericardial effusion.  Patient reports significant family history of rheumatoid arthritis in both of his parents as well as a son with juvenile rheumatoid arthritis.  He personally had never been diagnosed with rheumatoid arthritis.  He intermittently has some mild back pain.  Denies having any significant joint pains, skin changes, nodules, fevers, chills, falls, trauma history of TB, or recent sick contacts.  [place], but he lives in a residential neighborhood.  Patient also reports that he hikes, but the last take that he ever had to remove it was very small in over  3 years ago.  Patient reports that he had received his booster for COVID-19 on October 29.  He also reported prior history of having low sodium levels in the past for which he was advised by his primary care provider to increase his salt intake.   Assessment & Plan:   New onset Diastolic heart failure: Acute.   -Patient presented with complaints of shortness of breath.  Chest x-ray showed patchy bilateral infiltrates concerning for edema with bilateral pleural effusions. BNP elevated at 481.4.   -Echocardiogram had revealed EF 60-65% with large pericardial effusion present without signs of cardiac tamponade.   -With IV diuretics patient had improvement in lower extremity swelling and weight trended back to near patient's reported baseline.  -Cont. To hold Lasix due to hyponatremia (last dose on 12/20)  Pericardial effusion: Initially seen to have a large pericardial effusion without cardiac tamponade features on echocardiogram.  - Labs revealing sedimentation rate 70 and CRP 172.89 .  TSH: WNL -Patient was started on colchicine and ibuprofen.   -Repeat echocardiogram from 12/20 noting reduction in pericardial effusion size for which pericardiocentesis was not needed. -Rheumatoid factor: Complement level, ANA: All came back within normal limits. -Repeat echo in 1 to 2 weeks  Hyponatremia: Acute.  Sodium levels since admission 131->134->134-> 128->127->127-->125-->127--> 128 -Urine sodium: 110--> 72. 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 -Repeat BMP tomorrow a.m.  Moderate left pleural effusion: -  Persistent.  Reviewed chest x-ray from this morning.  Cardiology consulted PCCM for thoracentesis once he has been off of anticoagulation.  Complete heart  block s/p PM -Continue to monitor on telemetry.  Paroxysmal atrial fibrillation on anticoagulation:CHA2DS2-VASc score =3.    Hold Xarelto-for thoracentesis  Hypoalbuminemia: Acute.  On admission patient's albumin levels were noted to be low 2.3.  Prealbumin: 13.5. -Consult dietitian-continue multivitamins  DVT prophylaxis: SCD Code Status: Full code Family Communication: None present at bedside.  Plan of care discussed with patient in length and he verbalized understanding and agreed with it. Disposition Plan: To be determined   Procedures:   Echo  Antimicrobials:   None  Status is: Inpatient  Remains inpatient appropriate because:Persistent severe electrolyte disturbances   Dispo: The patient is from: Home              Anticipated d/c is to: Home              Anticipated d/c date is: 2 days              Patient currently is not medically stable to d/c.    Subjective: Patient seen and examined.  Resting comfortably on the bed.  On room air.  Communicating well.  No new complaints.  Remained afebrile.  No acute events overnight.  Denies chest pain, shortness of breath, leg swelling, orthopnea or PND  Objective: Vitals:   11/22/20 2037 11/23/20 0458 11/23/20 0900 11/23/20 1203  BP: 126/69 113/61  101/81  Pulse: 80 70    Resp: (!) 24 14    Temp: 98.2 F (36.8 C) 98.2 F (36.8 C)  97.9 F (36.6 C)  TempSrc: Oral Oral  Oral  SpO2: 95%     Weight:  74.8 kg 74.8 kg   Height:   5\' 7"  (1.702 m)     Intake/Output Summary (Last 24 hours) at 11/23/2020 1300 Last data filed at 11/23/2020 1252 Gross per 24 hour  Intake 463.68 ml  Output --  Net 463.68 ml   Filed Weights   11/22/20 0536 11/23/20 0458 11/23/20 0900  Weight: 76.5 kg 74.8 kg 74.8 kg    Examination:  General exam: Appears calm and comfortable, on room air, communicating well Respiratory system: Decreased breath sound noted on the left base.  No wheezing, rhonchi or crackles.   Cardiovascular system:  S1 & S2 heard, RRR. No JVD, murmurs, rubs, gallops or clicks. No pedal edema. Gastrointestinal system: Abdomen is nondistended, soft and nontender. No organomegaly or masses felt. Normal bowel sounds heard. Central nervous system: Alert and oriented. No focal neurological deficits. Extremities: Symmetric 5 x 5 power. Skin: No rashes, lesions or ulcers Psychiatry: Judgement and insight appear normal. Mood & affect appropriate.    Data Reviewed: I have personally reviewed following labs and imaging studies  CBC: Recent Labs  Lab 11/17/20 0121 11/21/20 0235  WBC 10.7* 9.1  HGB 11.6* 13.8  HCT 35.2* 38.5*  MCV 89.3 85.7  PLT 478* 525*   Basic Metabolic Panel: Recent Labs  Lab 11/17/20 0121 11/18/20 0333 11/19/20 0219 11/20/20 0244 11/21/20 0235 11/22/20 0602 11/23/20 0152  NA 134*   < > 127* 127* 125* 127* 128*  K 4.0   < > 4.1 4.9 4.4 4.3 4.6  CL 94*   < > 90* 92* 92* 93* 94*  CO2 28   < > 27 25 24  20* 25  GLUCOSE 120*   < > 110* 98 100* 129* 111*  BUN 19   < >  17 16 16 20  26*  CREATININE 1.07   < > 0.81 0.86 0.87 1.02 1.01  CALCIUM 8.2*   < > 8.2* 8.4* 8.0* 8.5* 8.4*  MG 2.0  --   --   --   --   --   --    < > = values in this interval not displayed.   GFR: Estimated Creatinine Clearance: 60 mL/min (by C-G formula based on SCr of 1.01 mg/dL). Liver Function Tests: Recent Labs  Lab 11/22/20 0605  AST 23  ALT 43  ALKPHOS 89  BILITOT 0.8  PROT 6.0*  ALBUMIN 2.3*   No results for input(s): LIPASE, AMYLASE in the last 168 hours. No results for input(s): AMMONIA in the last 168 hours. Coagulation Profile: No results for input(s): INR, PROTIME in the last 168 hours. Cardiac Enzymes: No results for input(s): CKTOTAL, CKMB, CKMBINDEX, TROPONINI in the last 168 hours. BNP (last 3 results) No results for input(s): PROBNP in the last 8760 hours. HbA1C: No results for input(s): HGBA1C in the last 72 hours. CBG: No results for input(s): GLUCAP in the last 168  hours. Lipid Profile: No results for input(s): CHOL, HDL, LDLCALC, TRIG, CHOLHDL, LDLDIRECT in the last 72 hours. Thyroid Function Tests: Recent Labs    11/21/20 1259  TSH 2.956   Anemia Panel: No results for input(s): VITAMINB12, FOLATE, FERRITIN, TIBC, IRON, RETICCTPCT in the last 72 hours. Sepsis Labs: Recent Labs  Lab 11/21/20 0235  PROCALCITON <0.10    Recent Results (from the past 240 hour(s))  Resp Panel by RT-PCR (Flu A&B, Covid) Nasopharyngeal Swab     Status: None   Collection Time: 11/15/20  5:30 PM   Specimen: Nasopharyngeal Swab; Nasopharyngeal(NP) swabs in vial transport medium  Result Value Ref Range Status   SARS Coronavirus 2 by RT PCR NEGATIVE NEGATIVE Final    Comment: (NOTE) SARS-CoV-2 target nucleic acids are NOT DETECTED.  The SARS-CoV-2 RNA is generally detectable in upper respiratory specimens during the acute phase of infection. The lowest concentration of SARS-CoV-2 viral copies this assay can detect is 138 copies/mL. A negative result does not preclude SARS-Cov-2 infection and should not be used as the sole basis for treatment or other patient management decisions. A negative result may occur with  improper specimen collection/handling, submission of specimen other than nasopharyngeal swab, presence of viral mutation(s) within the areas targeted by this assay, and inadequate number of viral copies(<138 copies/mL). A negative result must be combined with clinical observations, patient history, and epidemiological information. The expected result is Negative.  Fact Sheet for Patients:  11/17/20  Fact Sheet for Healthcare Providers:  BloggerCourse.com  This test is no t yet approved or cleared by the [place] FDA and  has been authorized for detection and/or diagnosis of SARS-CoV-2 by FDA under an Emergency Use Authorization (EUA). This EUA will remain  in effect (meaning this test  can be used) for the duration of the COVID-19 declaration under Section 564(b)(1) of the Act, 21 U.S.C.section 360bbb-3(b)(1), unless the authorization is terminated  or revoked sooner.       Influenza A by PCR NEGATIVE NEGATIVE Final   Influenza B by PCR NEGATIVE NEGATIVE Final    Comment: (NOTE) The Xpert Xpress SARS-CoV-2/FLU/RSV plus assay is intended as an aid in the diagnosis of influenza from Nasopharyngeal swab specimens and should not be used as a sole basis for treatment. Nasal washings and aspirates are unacceptable for Xpert Xpress SARS-CoV-2/FLU/RSV testing.  Fact Sheet for Patients: [place]  Fact Sheet for Healthcare Providers: SeriousBroker.it  This test is not yet approved or cleared by the [place] FDA and has been authorized for detection and/or diagnosis of SARS-CoV-2 by FDA under an Emergency Use Authorization (EUA). This EUA will remain in effect (meaning this test can be used) for the duration of the COVID-19 declaration under Section 564(b)(1) of the Act, 21 U.S.C. section 360bbb-3(b)(1), unless the authorization is terminated or revoked.  Performed at [facility], [address] [place], [place] [address]       Radiology Studies: DG CHEST PORT 1 VIEW  Result Date: 11/23/2020 CLINICAL DATA:  Pleural effusion follow-up EXAM: PORTABLE CHEST 1 VIEW COMPARISON:  Three days ago FINDINGS: Left more than right pleural effusion, likely moderate on the left. Cardiopericardial enlargement which is unchanged. Dual-chamber pacer leads from the left. No pulmonary edema or pneumothorax. Calcified granuloma over the right lower lobe. IMPRESSION: 1. Left more than right pleural effusion 2. No pulmonary edema or pneumothorax. Electronically Signed   By: Marnee Spring M.D.   On: 11/23/2020 11:07    Scheduled Meds:  colchicine  0.6 mg Oral BID   ibuprofen  600 mg Oral TID   lisinopril  10 mg  Oral QHS   mouth rinse  15 mL Mouth Rinse BID   multivitamin with minerals  1 tablet Oral Daily   pantoprazole  40 mg Oral Daily   rosuvastatin  5 mg Oral QHS   sodium chloride flush  3 mL Intravenous Q12H   Continuous Infusions:  sodium chloride     heparin 1,100 Units/hr (11/23/20 1252)     LOS: 8 days   Time spent: 30 minutes   Alondra Sahni Estill Cotta, MD [facility]  If 7PM-7AM, please contact night-coverage [email or web address] 11/23/2020, 1:00 PM

## 2020-11-23 NOTE — Plan of Care (Signed)
  Problem: Cardiac: Goal: Ability to achieve and maintain adequate cardiopulmonary perfusion will improve Outcome: Progressing   Problem: Clinical Measurements: Goal: Ability to maintain clinical measurements within normal limits will improve Outcome: Progressing   Problem: Nutrition: Goal: Adequate nutrition will be maintained Outcome: Progressing

## 2020-11-23 NOTE — Progress Notes (Signed)
ANTICOAGULATION CONSULT NOTE - Follow Up Consult  Pharmacy Consult for Heparin Indication: atrial fibrillation  No Known Allergies  Patient Measurements: Height: 5\' 7"  (170.2 cm) Weight: 74.8 kg (164 lb 14.5 oz) IBW/kg (Calculated) : 66.1 Heparin Dosing Weight: 74.8 kg  Vital Signs: Temp: 98.7 F (37.1 C) (12/25 2052) Temp Source: Oral (12/25 2052) BP: 101/81 (12/25 1203) Pulse Rate: 72 (12/25 2021)  Labs: Recent Labs    11/21/20 0235 11/22/20 0602 11/23/20 0152 11/23/20 1927  HGB 13.8  --   --   --   HCT 38.5*  --   --   --   PLT 525*  --   --   --   APTT  --   --   --  34  HEPARINUNFRC  --   --   --  0.17*  CREATININE 0.87 1.02 1.01  --     Estimated Creatinine Clearance: 60 mL/min (by C-G formula based on SCr of 1.01 mg/dL).   Medical History: Past Medical History:  Diagnosis Date  . Hypercholesteremia   . Hypertension   . Paroxysmal atrial fibrillation (HCC)   . 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 Second degree AV block     Medications:  Infusions:  . sodium chloride    . heparin 1,100 Units/hr (11/23/20 1252)    Assessment: 74 y.o. male with history of paroxysmal atrial fibrillation on Xarelto prior to admission. Xarelto has been held (last dose 12/22) for thoracentesis. Pharmacy has been consulted to start IV heparin since it does not sound like procedure will be done today, per cards.  Heparin drip 1100 uts/hr aptt 34sec seems abnormally low - IV line checked with no issues.  HL 0.17 (affected by rivaroxaban)  Hgb wnl on last CBC 12/23.    Goal of Therapy:  Heparin level 0.3-0.7 units/ml aPTT 66-102 seconds Monitor platelets by anticoagulation protocol: Yes   Plan:  Increase heparin infusion to 1300 units/hr Check heparin level and aPTT,CBC daily while on heparin Continue to monitor H&H and platelets    1/24 Pharm.D. CPP, BCPS Clinical Pharmacist [phone] 11/23/2020 9:38 PM    Please check [email or web address] for  unit-specific pharmacy phone numbers.

## 2020-11-24 ENCOUNTER — Other Ambulatory Visit: Payer: Self-pay | Admitting: Student

## 2020-11-24 ENCOUNTER — Inpatient Hospital Stay (HOSPITAL_COMMUNITY): Payer: Medicare Other

## 2020-11-24 DIAGNOSIS — E785 Hyperlipidemia, unspecified: Secondary | ICD-10-CM

## 2020-11-24 DIAGNOSIS — I10 Essential (primary) hypertension: Secondary | ICD-10-CM

## 2020-11-24 DIAGNOSIS — I7121 Aneurysm of the ascending aorta, without rupture: Secondary | ICD-10-CM

## 2020-11-24 DIAGNOSIS — I313 Pericardial effusion (noninflammatory): Secondary | ICD-10-CM

## 2020-11-24 DIAGNOSIS — I77810 Thoracic aortic ectasia: Secondary | ICD-10-CM

## 2020-11-24 DIAGNOSIS — I3139 Other pericardial effusion (noninflammatory): Secondary | ICD-10-CM

## 2020-11-24 DIAGNOSIS — E871 Hypo-osmolality and hyponatremia: Secondary | ICD-10-CM

## 2020-11-24 DIAGNOSIS — I712 Thoracic aortic aneurysm, without rupture: Secondary | ICD-10-CM

## 2020-11-24 DIAGNOSIS — I480 Paroxysmal atrial fibrillation: Secondary | ICD-10-CM

## 2020-11-24 HISTORY — DX: Other pericardial effusion (noninflammatory): I31.39

## 2020-11-24 HISTORY — DX: Thoracic aortic aneurysm, without rupture: I71.2

## 2020-11-24 HISTORY — DX: Pericardial effusion (noninflammatory): I31.3

## 2020-11-24 HISTORY — DX: Aneurysm of the ascending aorta, without rupture: I71.21

## 2020-11-24 HISTORY — DX: Hypo-osmolality and hyponatremia: E87.1

## 2020-11-24 HISTORY — DX: Hyperlipidemia, unspecified: E78.5

## 2020-11-24 HISTORY — DX: Thoracic aortic ectasia: I77.810

## 2020-11-24 LAB — CBC
HCT: 39.5 % (ref 39.0–52.0)
Hemoglobin: 12.7 g/dL — ABNORMAL LOW (ref 13.0–17.0)
MCH: 28.8 pg (ref 26.0–34.0)
MCHC: 32.2 g/dL (ref 30.0–36.0)
MCV: 89.6 fL (ref 80.0–100.0)
Platelets: 442 10*3/uL — ABNORMAL HIGH (ref 150–400)
RBC: 4.41 MIL/uL (ref 4.22–5.81)
RDW: 12.6 % (ref 11.5–15.5)
WBC: 8.5 10*3/uL (ref 4.0–10.5)
nRBC: 0 % (ref 0.0–0.2)

## 2020-11-24 LAB — APTT: aPTT: 52 seconds — ABNORMAL HIGH (ref 24–36)

## 2020-11-24 LAB — BODY FLUID CELL COUNT WITH DIFFERENTIAL
Eos, Fluid: 0 %
Lymphs, Fluid: 20 %
Monocyte-Macrophage-Serous Fluid: 14 % — ABNORMAL LOW (ref 50–90)
Neutrophil Count, Fluid: 66 % — ABNORMAL HIGH (ref 0–25)
Total Nucleated Cell Count, Fluid: 2247 cu mm — ABNORMAL HIGH (ref 0–1000)

## 2020-11-24 LAB — AMYLASE, PLEURAL OR PERITONEAL FLUID: Amylase, Fluid: 26 U/L

## 2020-11-24 LAB — BASIC METABOLIC PANEL
Anion gap: 9 (ref 5–15)
BUN: 20 mg/dL (ref 8–23)
CO2: 26 mmol/L (ref 22–32)
Calcium: 8.5 mg/dL — ABNORMAL LOW (ref 8.9–10.3)
Chloride: 96 mmol/L — ABNORMAL LOW (ref 98–111)
Creatinine, Ser: 0.96 mg/dL (ref 0.61–1.24)
GFR, Estimated: >60 mL/min (ref >60)
Glucose, Bld: 99 mg/dL (ref 70–99)
Potassium: 4.4 mmol/L (ref 3.5–5.1)
Sodium: 131 mmol/L — ABNORMAL LOW (ref 135–145)

## 2020-11-24 LAB — LACTATE DEHYDROGENASE, PLEURAL OR PERITONEAL FLUID: LD, Fluid: 146 U/L — ABNORMAL HIGH (ref 3–23)

## 2020-11-24 LAB — PROTEIN, PLEURAL OR PERITONEAL FLUID: Total protein, fluid: 3.6 g/dL

## 2020-11-24 LAB — GRAM STAIN

## 2020-11-24 LAB — GLUCOSE, PLEURAL OR PERITONEAL FLUID: Glucose, Fluid: 96 mg/dL

## 2020-11-24 LAB — ALBUMIN, PLEURAL OR PERITONEAL FLUID: Albumin, Fluid: 1.8 g/dL

## 2020-11-24 LAB — HEPARIN LEVEL (UNFRACTIONATED): Heparin Unfractionated: 0.26 IU/mL — ABNORMAL LOW (ref 0.30–0.70)

## 2020-11-24 LAB — MAGNESIUM: Magnesium: 2 mg/dL (ref 1.7–2.4)

## 2020-11-24 MED ORDER — FUROSEMIDE 40 MG PO TABS: 40.0000 mg | ORAL_TABLET | Freq: Every day | ORAL | 1 refills | Status: DC | PRN

## 2020-11-24 MED ORDER — SODIUM CHLORIDE 1 G PO TABS: 1.0000 g | ORAL_TABLET | Freq: Two times a day (BID) | ORAL | 1 refills | Status: DC

## 2020-11-24 MED ORDER — COLCHICINE 0.6 MG PO TABS: 0.6000 mg | ORAL_TABLET | Freq: Two times a day (BID) | ORAL | 2 refills | Status: DC

## 2020-11-24 MED ORDER — LIDOCAINE HCL (PF) 1 % IJ SOLN
INTRAMUSCULAR | Status: AC
  Filled 2020-11-24: qty 30

## 2020-11-24 MED ORDER — IBUPROFEN 600 MG PO TABS: 600.0000 mg | ORAL_TABLET | Freq: Three times a day (TID) | ORAL | 0 refills | Status: DC

## 2020-11-24 MED ORDER — PANTOPRAZOLE SODIUM 40 MG PO TBEC: 40.0000 mg | DELAYED_RELEASE_TABLET | Freq: Every day | ORAL | 0 refills | Status: DC

## 2020-11-24 NOTE — Progress Notes (Signed)
Pt sleeping with CPAP on at this time-will obtain standing wt once patient awakens for the morning. Dierdre Highman, RN

## 2020-11-24 NOTE — Evaluation (Signed)
Physical Therapy Evaluation Patient Details Name: Ricky Hood MRN: [id number] DOB: [birth year] Today's Date: 11/24/2020   History of Present Illness  74 y.o. 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  Clinical Impression   Patient evaluated by Physical Therapy with no further acute PT needs identified, as noted plans for dc home today;  All education has been completed and the patient has no further questions. Recommending HHPT, TOC RN aware;  See below for any follow-up Physical Therapy or equipment needs. Acute PT is signing off. Thank you for this referral.  OK for dc home from PT standpoint.     Follow Up Recommendations Home health PT;Supervision/Assistance - 24 hour    Equipment Recommendations  None recommended by PT (pretty well-equipped)    Recommendations for Other Services       Precautions / Restrictions Precautions Precautions: Other (comment) Precaution Comments: Cues to self-monitor for activity tolerance      Mobility  Bed Mobility Overal bed mobility: Needs Assistance Bed Mobility: Supine to Sit     Supine to sit: Min assist     General bed mobility comments: Min handheld assist to pull to sit    Transfers Overall transfer level: Needs assistance Equipment used: Rolling walker (2 wheeled) Transfers: Sit to/from Stand Sit to Stand: Min assist         General transfer comment: cues for hand placement and min assist to steady during rise; notable posterior lean, able to correct with cues; decr control with stand to sit  Ambulation/Gait Ambulation/Gait assistance: Min guard Gait Distance (Feet): 120 Feet Assistive device: Rolling  walker (2 wheeled) Gait Pattern/deviations: Step-through pattern     General Gait Details: Slow guarded gait  Stairs            Wheelchair Mobility    Modified Rankin (Stroke Patients Only)       Balance Overall balance assessment: Mild deficits observed, not formally tested                                           Pertinent Vitals/Pain Pain Assessment: No/denies pain    Home Living Family/patient expects to be discharged to:: Private residence Living Arrangements: Spouse/significant other Available Help at Discharge: Family;Available 24 hours/day Type of Home: House Home Access: Ramped entrance     Home Layout: Multi-level;Able to live on main level with bedroom/bathroom Home Equipment: Shower seat - built in;Walker - 2 wheels;Walker - 4 wheels      Prior Function Level of Independence: Independent         Comments: had been walking 3 miles/day up until early Dec     Hand Dominance        Extremity/Trunk Assessment   Upper Extremity Assessment Upper Extremity Assessment: Overall WFL for tasks assessed    Lower Extremity Assessment Lower Extremity Assessment: Generalized weakness       Communication   Communication: No difficulties;HOH  Cognition Arousal/Alertness: Awake/alert Behavior During Therapy: WFL for tasks assessed/performed Overall Cognitive Status: Within Functional Limits for tasks assessed  General Comments General comments (skin integrity, edema, etc.): VSS: BP 154/77, HR 91, O2 sat 96% post amb    Exercises     Assessment/Plan    PT Assessment All further PT needs can be met in the next venue of care  PT Problem List Decreased strength;Decreased activity tolerance;Decreased balance;Decreased mobility;Decreased knowledge of use of DME;Decreased safety awareness;Decreased knowledge of precautions;Cardiopulmonary status limiting activity       PT  Treatment Interventions      PT Goals (Current goals can be found in the Care Plan section)  Acute Rehab PT Goals Patient Stated Goal: Home today PT Goal Formulation: All assessment and education complete, DC therapy    Frequency     Barriers to discharge        Co-evaluation               AM-PAC PT "6 Clicks" Mobility  Outcome Measure Help needed turning from your back to your side while in a flat bed without using bedrails?: None Help needed moving from lying on your back to sitting on the side of a flat bed without using bedrails?: A Little Help needed moving to and from a bed to a chair (including a wheelchair)?: A Little Help needed standing up from a chair using your arms (e.g., wheelchair or bedside chair)?: A Little Help needed to walk in hospital room?: A Little Help needed climbing 3-5 steps with a railing? : A Little 6 Click Score: 19    End of Session Equipment Utilized During Treatment: Gait belt Activity Tolerance: Patient tolerated treatment well Patient left: in bed;with call bell/phone within reach;with family/visitor present Nurse Communication: Mobility status PT Visit Diagnosis: Other abnormalities of gait and mobility (R26.89)    Time: 1458-1530 PT Time Calculation (min) (ACUTE ONLY): 32 min   Charges:   PT Evaluation $PT Eval Low Complexity: 1 Low PT Treatments $Gait Training: 8-22 mins        Roney Marion, PT  Acute Rehabilitation Services Pager [phone] Office [phone]   [place] 11/24/2020, 5:01 PM

## 2020-11-24 NOTE — Progress Notes (Signed)
ANTICOAGULATION CONSULT NOTE - Follow Up Consult  Pharmacy Consult for Heparin Indication: atrial fibrillation  No Known Allergies  Patient Measurements: Height: 5\' 7"  (170.2 cm) Weight: 74.8 kg (164 lb 14.5 oz) IBW/kg (Calculated) : 66.1 Heparin Dosing Weight: 74.8 kg  Vital Signs: Temp: 97.9 F (36.6 C) (12/26 0504) Temp Source: Oral (12/26 0504) Pulse Rate: 72 (12/25 2021)  Labs: Recent Labs    11/22/20 0602 11/23/20 0152 11/23/20 1927 11/24/20 0515  HGB  --   --   --  12.7*  HCT  --   --   --  39.5  PLT  --   --   --  442*  APTT  --   --  34 52*  HEPARINUNFRC  --   --  0.17* 0.26*  CREATININE 1.02 1.01  --   --     Estimated Creatinine Clearance: 60 mL/min (by C-G formula based on SCr of 1.01 mg/dL).  Assessment: 74 y.o. male with history of paroxysmal atrial fibrillation on Xarelto prior to admission. Xarelto has been held (last dose 12/22) for thoracentesis. Pharmacy has been consulted to start IV heparin since it does not sound like procedure will be done today, per cards.  Heparin drip 1100 uts/hr aptt 34sec seems abnormally low - IV line checked with no issues.  HL 0.17 (affected by rivaroxaban)   Heparin level 0.26 and PTT 52 sec (almost correlating) - both subtherapeutic on gtt at 1300 units/hr. No issues with line or bleeding reported per RN.  Goal of Therapy:  Heparin level 0.3-0.7 units/ml aPTT 66-102 seconds Monitor platelets by anticoagulation protocol: Yes   Plan:  Increase heparin infusion to 1450 units/hr F/u 8hr heparin level  1/23, PharmD, BCPS Please see amion for complete clinical pharmacist phone list 11/24/2020 6:12 AM

## 2020-11-24 NOTE — Discharge Summary (Addendum)
Discharge Summary    Patient ID: Ricky Hood MRN: [id number]; DOB: 1946/11/30  Admit date: 11/15/2020 Discharge date: 11/24/2020  Primary Care Provider: Marlyn CorporalYates, Kate H, PA  Primary Cardiologist: Garwin Brothersajan R Revankar, MD  Primary Electrophysiologist:  None   Discharge Diagnoses    Principal Problem:   Acute diastolic heart failure [facility](HCC) Active Problems:   Pericardial effusion   Pleural effusion   Complete heart block (HCC)   [employer] Jude Medical Assurity MRI conditional  dual-chamber pacemaker for symptomatic complete heart block     Paroxysmal atrial fibrillation (HCC)   Dilated aortic root (HCC)   Aneurysm of ascending aorta (HCC)   Hypertension   Hyperlipidemia   Hyponatremia    Diagnostic Studies/Procedures    Complete Echocardiogram 11/16/2020: Impressions: 1. There is a large pericardial effusion present measuring 2.6cm at end  diastole. Appears to be largest at the LV apex. The is about 25%  respiratory variation across the mitral valve. IVC is dilated but  collapses >50% with inspiration suggesting RAP  8mmHg. There is no RV/RA diastolic collapse. Overall, no tamponade  physiology present.   2. Left ventricular ejection fraction, by estimation, is 60 to 65%. The  left ventricle has normal function. The left ventricle has no regional  wall motion abnormalities. There is mild concentric left ventricular  hypertrophy. Left ventricular diastolic  parameters are consistent with Grade I diastolic dysfunction (impaired  relaxation).   3. Right ventricular systolic function is normal. The right ventricular  size is normal. There is moderately elevated pulmonary artery systolic  pressure.   4. The mitral valve is normal in structure. Mild mitral valve  regurgitation.   5. Tricuspid valve regurgitation is moderate.   6. The aortic valve is tricuspid. There is mild calcification of the  aortic valve. There is mild thickening of the aortic valve. Aortic valve  regurgitation  is mild. Mild aortic valve sclerosis is present, with no  evidence of aortic valve stenosis.   7. Aortic dilatation noted. There is mild dilatation of the aortic root,  measuring 43 mm. There is mild to moderate dilatation of the ascending  aorta, measuring 43 mm.   8. The inferior vena cava is dilated in size with >50% respiratory  variability, suggesting right atrial pressure of 8 mmHg.   Comparison(s): Compared to prior echo on 09/17/20, there is now a large  pericardial effusion present. Findings discussed with Dr. Wyline MoodBranch.  _______________   Limited Echocardiogram 11/18/2020: Impressions:  1. Limited echo for pericardial effusion   2. Right ventricular systolic function is normal. The right ventricular  size is normal. There is normal pulmonary artery systolic pressure. The  estimated right ventricular systolic pressure is 33.8 mmHg.   3. Small to moderate-sized pericardial effusion. The pericardial effusion  is circumferential. There is no evidence of cardiac tamponade.   4. The aortic valve is tricuspid. Aortic valve regurgitation is trivial.   5. Aortic dilatation noted. There is mild dilatation of the aortic root,  measuring 41 mm. There is moderate dilatation of the ascending aorta,  measuring 45 mm.   6. The inferior vena cava is dilated in size with >50% respiratory  variability, suggesting right atrial pressure of 8 mmHg.   7. Left ventricular ejection fraction, by estimation, is 60 to 65%. The  left ventricle has normal function.   Comparison(s): Changes from prior study are noted. 11/16/2020: LVEF  60-65%, large pericardial effusion. Compared to the prior study, the  pericardial effusion is smaller.  Thoracentesis 11/24/2020: Procedure: [place]  guided left thoracentesis EBL: Minimal Complications: None immediate  History of Present Illness     Ricky Hood is a 74 y.o. male with a history of paroxysmal atrial fibrillation on Flecainide and Xarelto,  complete  heart block with syncope s/p PPM placement in 08/2020 hypertension, hyperlipidemia, prior retinal vein occlusion, and prostate enlargement who was admitted on 11/15/2020 for acute diastolic CHF after presenting with shortness of breath and lower extremity edema.  Patient was seen in the Atrial Fibrillation Clinic on morning of presenting and had severe shortness of breath. Therefore, he was sent to the ED for further work-up. Patient reported worsening shortness of breath and lower extremity edema for the pas few days. On 11/08/2020, he was found to be in atrial fibrillation and was started on Flecainide by Dr. Johney Frame. He did convert to sinus rhythm after that but shortness of breath and lower extremity edema continued to worsen. He also reported a 21 lb weight gain. Wife reported that he also had been wheezing a lot, especially at night. He denied any chest pain.   Initial chest x-ray showed patchy bilateral pulmonary opacities as well as new moderate left and trace right pleural effusion. BNP was elevated at 481. He was admitted for further evaluation of acute diastolic CHF.  Hospital Course     Consultants: [facility], PCCM, PT  New Onset Acute Diastolic CHF BNP elevated at 481. Chest x-ray showed patchy bilateral pulmonary opacities and new moderate left and trace right pleural effusions. Echo showed LVEF of 60-65% with normal wall motion, grade 1 diastolic dysfunction, mild MR, moderate TR, moderately elevated PASP as well as a new large pericardial effusion. Initially diuresed with IV Lasix but this was stopped due to hyponatremia. Last dose was 11/18/2020. Net negative 16.8 L this admission. Discharge weight 164 lbs (189 lbs on admission). He appears euvolemic on exam. Will discharge patient on PRN Lasix  daily for 3lb weight gain in 1 day or 5 lb weight gain in 1 week or worsening lower extremity edema that does not improve with elevating legs. Recommend low sodium diet.   Pericardial  Effusion Initial Echo this admission showed large pericardial effusion measuring 2.6cm at end diastole. Largest at the LV apex. No evidence of tamponade. This was new from his Echo in 08/2020. Repeat Echo on 11/18/2020 showed improvement in effusion - considered small to moderate. Sed Rate elevated at 70 and high-sensitivity elevated at 172. ANA, rheumatoid factor, and completement total normal. He was started on Colchicine 0.6mg  twice daily and Ibuprofen  three times daily. Will continue Colchicine for 3 months and Ibuprofen for 10 days per Dr. Allyson Sabal. Will also prescribe Protonix  daily while on high dose Ibuprofen. Consider repeat Echo as outpatient.   Moderate Left Pleural Effusion Patient has had persistent moderate left pleural effusion on multiple repeat chest x-ray. PCCM was consulted and IR performed left thoracentesis on 11/24/2020 and 850 mL of fluid were removed. Repeat chest x-ray showed no evidence of pneumothorax. LDH (pleural fluid) elevated at 146. Body fluid cell count was amber in color, cloudy in appearance, elevated total nucleated cell count of 2,247, elevated neutrophil count of 66, and low monocyte macrophage count of 14. Glucose, protein, albumin, amylase normal. PH, cholesterol, triglycerides, gram stain, and acid fast pending. Also asked for cytology of fluid.  Paroxysmal Atrial Fibrillation Diagnosed on 11/08/2020 and started on Flecainide. This was stopped during this admission due to acute CHF. He has remained in normal sinus rhythm with ventricular paced rhythm. Xarelto  was held for thoracentesis at wife's request. Can restart this tomorrow.  Complete Heart Block S/p PPM placement in 08/2020. Followed by Dr. Johney Frame.  Dilated Aortic Root/Ascending Aorta Echo showed aortic root measuring 20mm and ascending aorta measuring 75mm. Recommend chest CTA as outpatient for further evaluation. BP has been well controlled.  Hypertension BP well controlled. Continue home  Lisinopril 10mg  daily.  Hyperlipidemia Continue home Crestor.  Hyponatremia Sodium was 131 on admission and dropped to 125 on 11/21/2020. Urine sodium 110 >> 72. Urine Osmolality 586 >> 367. Cosyuntropin stimulation test showed increase in cortisol (no adrenal insufficiency).  [facility] was consulted and is helping with this. Dr. 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 Patient was placed on sodium chloride tablets on 12/24 and sodium improved to 131 on discharge. Will discharge patient on sodium chloride tablet 1g twice daily. Recommended low sodium diet and fluid restrictions (<1.5 L per day) given CHF. Will repeat CMET in 3-4 days.  Hypoalbuminemia Albumin 2.3 this admission and pre-albumin 13.5. This likely contributed to volume overload. Dietician was consulted.  Continue multivitamins. Will recheck CMET in 3-4 days.  Deconditioning Patient reported feeling deconditioned after being in the hospital for >1 week. Will ask PT to see prior to discharge and will place order for home health PT if needed.  Patient seen and examined today by Dr. 1/25 and determined to be stable for discharge after thoracentesis. I have sent message to schedulers to help arrange follow-up visit with Dr. Allyson Sabal in 1-2 weeks. Medications as below.  Did the patient have an acute coronary syndrome (MI, NSTEMI, STEMI, etc) this admission?:  No.   The elevated Troponin was due to the acute medical illness (demand ischemia).      _____________  Discharge Vitals Blood pressure (!) 142/68, pulse 72, temperature 97.9 F (36.6 C), temperature source Oral, resp. rate 18, height 5\' 7"  (1.702 m), weight 74.8 kg, SpO2 96 %.  Filed Weights   11/22/20 0536 11/23/20 0458 11/23/20 0900  Weight: 76.5 kg 74.8 kg 74.8 kg    Labs & Radiologic Studies    CBC Recent Labs    11/24/20 0515  WBC 8.5  HGB 12.7*  HCT 39.5  MCV 89.6  PLT 442*   Basic Metabolic  Panel Recent Labs    11/23/20 0152 11/24/20 0515  NA 128* 131*  K 4.6 4.4  CL 94* 96*  CO2 25 26  GLUCOSE 111* 99  BUN 26* 20  CREATININE 1.01 0.96  CALCIUM 8.4* 8.5*  MG  --  2.0   Liver Function Tests Recent Labs    11/22/20 0605  AST 23  ALT 43  ALKPHOS 89  BILITOT 0.8  PROT 6.0*  ALBUMIN 2.3*   No results for input(s): LIPASE, AMYLASE in the last 72 hours. High Sensitivity Troponin:   Recent Labs  Lab 11/15/20 1639  TROPONINIHS 18*    BNP Invalid input(s): POCBNP D-Dimer No results for input(s): DDIMER in the last 72 hours. Hemoglobin A1C No results for input(s): HGBA1C in the last 72 hours. Fasting Lipid Panel No results for input(s): CHOL, HDL, LDLCALC, TRIG, CHOLHDL, LDLDIRECT in the last 72 hours. Thyroid Function Tests No results for input(s): TSH, T4TOTAL, T3FREE, THYROIDAB in the last 72 hours.  Invalid input(s): FREET3 _____________  DG Chest 1 View  Result Date: 11/24/2020 CLINICAL DATA:  Status post left thoracentesis EXAM: CHEST  1 VIEW COMPARISON:  11/23/2020 FINDINGS: No pneumothorax is noted following  left thoracentesis. Cardiac shadow remains enlarged. Pacing device is again seen. Right lung is well aerated with small pleural effusion. Calcified granuloma is again noted in the right mid lung. Interval reduction of left-sided pleural effusion is noted. No bony abnormality is seen. IMPRESSION: No evidence of pneumothorax following left-sided thoracentesis. Small right pleural effusion remains. Electronically Signed   By: Alcide Clever M.D.   On: 11/24/2020 10:06   DG Chest 2 View  Result Date: 11/20/2020 CLINICAL DATA:  Pleural effusion. EXAM: CHEST - 2 VIEW COMPARISON:  December 17, 21. FINDINGS: Similar moderate left and trace right pleural effusion. Similar bibasilar opacities. Cardiac silhouette is largely obscured. Aortic atherosclerosis. Similar position of the left subclavian cardiac rhythm maintenance device. IMPRESSION: Similar moderate  left and trace right pleural effusion. Similar bibasilar opacities, which could represent atelectasis, aspiration, and/or pneumonia. Electronically Signed   By: Feliberto Harts MD   On: 11/20/2020 13:37   DG Chest 2 View  Result Date: 11/15/2020 CLINICAL DATA:  shob EXAM: CHEST - 2 VIEW COMPARISON:  10/2o/2021 and prior. FINDINGS: Dual lead left chest wall pacing device. New patchy right upper lung and bibasilar opacities. No pneumothorax. Trace right and moderate left pleural effusions. Partially obscured cardiomediastinal silhouette. No acute osseous abnormality. IMPRESSION: Patchy bilateral pulmonary opacities, infection versus edema. New moderate left and trace right pleural effusions. Electronically Signed   By: Stana Bunting M.D.   On: 11/15/2020 10:12   DG CHEST PORT 1 VIEW  Result Date: 11/23/2020 CLINICAL DATA:  Pleural effusion follow-up EXAM: PORTABLE CHEST 1 VIEW COMPARISON:  Three days ago FINDINGS: Left more than right pleural effusion, likely moderate on the left. Cardiopericardial enlargement which is unchanged. Dual-chamber pacer leads from the left. No pulmonary edema or pneumothorax. Calcified granuloma over the right lower lobe. IMPRESSION: 1. Left more than right pleural effusion 2. No pulmonary edema or pneumothorax. Electronically Signed   By: Marnee Spring M.D.   On: 11/23/2020 11:07   ECHOCARDIOGRAM COMPLETE  Result Date: 11/16/2020    ECHOCARDIOGRAM REPORT   Patient Name:   Ricky Hood Date of Exam: 11/16/2020 Medical Rec #:  [id number]      Height:       67.0 in Accession #:    [id number]     Weight:       189.0 lb Date of Birth:  1946-04-13      BSA:          1.974 m Patient Age:    74 years       BP:           134/64 mmHg Patient Gender: M              HR:           67 bpm. Exam Location:  Inpatient Procedure: 2D Echo, Color Doppler and Cardiac Doppler Indications:    I50.31 Acute diastolic (congestive) heart failure  History:        Patient has prior history  of Echocardiogram examinations, most                 recent 09/17/2020. Pacemaker; Risk Factors:Hypertension and                 Dyslipidemia. Pacemaker placed 08/2020.  Sonographer:    Irving Burton Senior RDCS Referring Phys: [id number] [facility] PATWARDHAN IMPRESSIONS  1. There is a large pericardial effusion present measuring 2.6cm at end diastole. Appears to be largest at the LV apex. The is about 25% respiratory variation  across the mitral valve. IVC is dilated but collapses >50% with inspiration suggesting RAP . There is no RV/RA diastolic collapse. Overall, no tamponade physiology present.  2. Left ventricular ejection fraction, by estimation, is 60 to 65%. The left ventricle has normal function. The left ventricle has no regional wall motion abnormalities. There is mild concentric left ventricular hypertrophy. Left ventricular diastolic parameters are consistent with Grade I diastolic dysfunction (impaired relaxation).  3. Right ventricular systolic function is normal. The right ventricular size is normal. There is moderately elevated pulmonary artery systolic pressure.  4. The mitral valve is normal in structure. Mild mitral valve regurgitation.  5. Tricuspid valve regurgitation is moderate.  6. The aortic valve is tricuspid. There is mild calcification of the aortic valve. There is mild thickening of the aortic valve. Aortic valve regurgitation is mild. Mild aortic valve sclerosis is present, with no evidence of aortic valve stenosis.  7. Aortic dilatation noted. There is mild dilatation of the aortic root, measuring 43 mm. There is mild to moderate dilatation of the ascending aorta, measuring 43 mm.  8. The inferior vena cava is dilated in size with >50% respiratory variability, suggesting right atrial pressure of 8 mmHg. Comparison(s): Compared to prior echo on 09/17/20, there is now a large pericardial effusion present. Findings discussed with Dr. Wyline Mood. FINDINGS  Left Ventricle: Left ventricular ejection  fraction, by estimation, is 60 to 65%. The left ventricle has normal function. The left ventricle has no regional wall motion abnormalities. The left ventricular internal cavity size was normal in size. There is  mild concentric left ventricular hypertrophy. Left ventricular diastolic parameters are consistent with Grade I diastolic dysfunction (impaired relaxation). Right Ventricle: The right ventricular size is normal. No increase in right ventricular wall thickness. Right ventricular systolic function is normal. There is moderately elevated pulmonary artery systolic pressure. The tricuspid regurgitant velocity is 3.07 m/s, and with an assumed right atrial pressure of 8 mmHg, the estimated right ventricular systolic pressure is 45.7 mmHg. Left Atrium: Left atrial size was normal in size. Right Atrium: Right atrial size was normal in size. Pericardium: There is a large pericardial effusion present measuring 2.6cm at end diastole. Appears to be largest at the LV apex. The is about 25% respiratory variation across the mitral valve. IVC is dilated but collapses >50% with inspiration suggesting RAP . There is no RV/RA diastolic collapse. Overall, no tamponade physiology present. Mitral Valve: The mitral valve is normal in structure. There is mild thickening of the mitral valve leaflet(s). Mild mitral valve regurgitation. Tricuspid Valve: The tricuspid valve is normal in structure. Tricuspid valve regurgitation is moderate. Aortic Valve: The aortic valve is tricuspid. There is mild calcification of the aortic valve. There is mild thickening of the aortic valve. Aortic valve regurgitation is mild. Mild aortic valve sclerosis is present, with no evidence of aortic valve stenosis. Pulmonic Valve: The pulmonic valve was normal in structure. Pulmonic valve regurgitation is trivial. Aorta: Aortic dilatation noted. There is mild dilatation of the aortic root, measuring 43 mm. There is mild to moderate dilatation of the  ascending aorta, measuring 43 mm. Venous: The inferior vena cava is dilated in size with greater than 50% respiratory variability, suggesting right atrial pressure of 8 mmHg. IAS/Shunts: No atrial level shunt detected by color flow Doppler. Additional Comments: A pacer wire is visualized.  LEFT VENTRICLE PLAX 2D LVIDd:         3.80 cm  Diastology LVIDs:         2.40  cm  LV e' medial:    5.11 cm/s LV PW:         1.20 cm  LV E/e' medial:  13.2 LV IVS:        1.30 cm  LV e' lateral:   3.59 cm/s LVOT diam:     2.30 cm  LV E/e' lateral: 18.7 LV SV:         95 LV SV Index:   48 LVOT Area:     4.15 cm  RIGHT VENTRICLE RV S prime:     11.10 cm/s TAPSE (M-mode): 2.0 cm LEFT ATRIUM             Index       RIGHT ATRIUM           Index LA diam:        3.70 cm 1.87 cm/m  RA Area:     18.10 cm LA Vol (A2C):   59.9 ml 30.34 ml/m RA Volume:   50.60 ml  25.63 ml/m LA Vol (A4C):   49.8 ml 25.23 ml/m LA Biplane Vol: 55.9 ml 28.32 ml/m  AORTIC VALVE LVOT Vmax:   128.00 cm/s LVOT Vmean:  92.000 cm/s LVOT VTI:    0.229 m  AORTA Ao Root diam: 4.30 cm Ao Asc diam:  4.30 cm MITRAL VALVE               TRICUSPID VALVE MV Area (PHT): 1.69 cm    TR Peak grad:   37.7 mmHg MV Decel Time: 450 msec    TR Vmax:        307.00 cm/s MV E velocity: 67.30 cm/s MV A velocity: 84.80 cm/s  SHUNTS MV E/A ratio:  0.79        Systemic VTI:  0.23 m                            Systemic Diam: 2.30 cm Laurance Flatten MD Electronically signed by Laurance Flatten MD Signature Date/Time: 11/16/2020/2:02:20 PM    Final    CUP PACEART INCLINIC DEVICE CHECK  Result Date: 11/08/2020 Pacemaker check in clinic. Normal device function. Thresholds, sensing, impedances consistent with previous measurements. Device programmed to maximize longevity. AT/AF burden 7.9%, 20 AMS episodes with EGMs that show AF with controlled v-rates, longest episode 1 day 4 hours 44 seconds, new onset AF.No high ventricular rates noted. Device programmed at appropriate safety  margins. Histogram distribution appropriate for patient activity level. Device programmed to optimize intrinsic conduction. Estimated longevity 4 years , 8 months. Patient enrolled in remote follow-up and next remote scheduled for 12/18/20. Patient education completed. Follow-up with [facility] 12/18/2020.  CUP PACEART INCLINIC DEVICE CHECK  Result Date: 11/08/2020 Pacemaker check in clinic. Normal device function. Thresholds, sensing, impedances consistent with previous measurements. Device programmed to maximize longevity. AT/AF burden 7.9%, 20 AMS episodes with EGMs that show AF with controlled v-rates, longest episode 1 day 4 hours 44 seconds, new onset AF.No high ventricular rates noted. Device programmed at appropriate safety margins. Histogram distribution appropriate for patient activity level. Device programmed to optimize intrinsic conduction. Estimated longevity 4 years , 8 months. Patient enrolled in remote follow-up and next remote scheduled for 12/18/20. Patient education completed. Follow-up with [facility] 12/18/2020. Pacemaker check in clinic. Normal device function. Thresholds, sensing, impedances consistent with previous measurements. Device programmed to maximize longevity. AT/AF burden 7.9%, 20 AMS episodes with EGMs that show AF with controlled v-rates, longest episode 1 day 4 hours  44 seconds, new onset AF.No high ventricular rates noted. Device programmed at appropriate safety margins. Histogram distribution appropriate for patient activity level. Device programmed to optimize intrinsic conduction. Estimated longevity 4 years , 8 months. Patient enrolled in remote follow-up and next remote scheduled for 12/18/20. Patient education completed. Follow-up with [facility] 12/18/2020.  ECHOCARDIOGRAM LIMITED  Result Date: 11/18/2020    ECHOCARDIOGRAM LIMITED REPORT   Patient Name:   Ricky Hood Date of Exam: 11/18/2020 Medical Rec #:  [id number]      Height:       67.0 in Accession #:    [id number]     Weight:        172.0 lb Date of Birth:  11-28-1946      BSA:          1.897 m Patient Age:    74 years       BP:           120/75 mmHg Patient Gender: M              HR:           92 bpm. Exam Location:  Inpatient Procedure: Limited Color Doppler, Limited Echo and Cardiac Doppler Indications:    pericardial effusion  History:        Patient has prior history of Echocardiogram examinations, most                 recent 11/16/2020.  Sonographer:    Delcie Roch Referring Phys: [id number] Dorothe Pea BRANCH IMPRESSIONS  1. Limited echo for pericardial effusion  2. Right ventricular systolic function is normal. The right ventricular size is normal. There is normal pulmonary artery systolic pressure. The estimated right ventricular systolic pressure is 33.8 mmHg.  3. Small to moderate-sized pericardial effusion. The pericardial effusion is circumferential. There is no evidence of cardiac tamponade.  4. The aortic valve is tricuspid. Aortic valve regurgitation is trivial.  5. Aortic dilatation noted. 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  6. The inferior vena cava is dilated in size with >50% respiratory variability, suggesting right atrial pressure of 8 mmHg.  7. Left ventricular ejection fraction, by estimation, is 60 to 65%. The left ventricle has normal function. Comparison(s): Changes from prior study are noted. 11/16/2020: LVEF 60-65%, large pericardial effusion. Compared to the prior study, the pericardial effusion is smaller. FINDINGS  Left Ventricle: Left ventricular ejection fraction, by estimation, is 60 to 65%. The left ventricle has normal function. Right Ventricle: The right ventricular size is normal. No increase in right ventricular wall thickness. Right ventricular systolic function is normal. There is normal pulmonary artery systolic pressure. The tricuspid regurgitant velocity is 2.54 m/s, and  with an assumed right atrial  pressure of 8 mmHg, the estimated right ventricular systolic pressure is 33.8 mmHg. Pericardium: Small to moderate-sized pericardial effusion. The pericardial effusion is circumferential. There is no evidence of cardiac tamponade. Tricuspid Valve: The tricuspid valve is grossly normal. Tricuspid valve regurgitation is trivial. Aortic Valve: The aortic valve is tricuspid. Aortic valve regurgitation is trivial. Pulmonic Valve: The pulmonic valve was grossly normal. Pulmonic valve regurgitation is trivial. Aorta: Aortic dilatation noted. 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 Venous: The inferior vena cava is dilated in size with greater than 50% respiratory variability, suggesting right atrial pressure of 8 mmHg. IVC  IVC diam: 2.20 cm  AORTA Ao Root diam: 4.00 cm Ao Asc diam:  4.50 cm TRICUSPID VALVE TR Peak grad:   25.8 mmHg TR Vmax:        254.00 cm/s Zoila Shutter MD Electronically signed by Zoila Shutter MD Signature Date/Time: 11/18/2020/4:10:12 PM    Final    US THORACENTESIS ASP PLEURAL SPACE W/IMG GUIDE  Result Date: 11/24/2020 INDICATION: Shortness of breath EXAM: ULTRASOUND GUIDED LEFT THORACENTESIS MEDICATIONS: Lidocaine 1% subcutaneous COMPLICATIONS: None immediate. PROCEDURE: An ultrasound guided thoracentesis was thoroughly discussed with the patient and questions answered. The benefits, risks, alternatives and complications were also discussed. The patient understands and wishes to proceed with the procedure. Written consent was obtained. Ultrasound was performed to localize and mark an adequate pocket of fluid in the left chest. The area was then prepped and draped in the normal sterile fashion. 1% Lidocaine was used for local anesthesia. Under ultrasound guidance a 6 Fr Safe-T-Centesis catheter was introduced. Thoracentesis was performed. The catheter was removed and a dressing applied. FINDINGS: A total  of approximately 850 mL of clear yellow fluid was removed. Samples were sent to the laboratory as requested by the clinical team. IMPRESSION: Successful ultrasound guided left thoracentesis yielding 850 mL of pleural fluid. Electronically Signed   By: Corlis Leak M.D.   On: 11/24/2020 09:51   Disposition   Patient is being discharged home today in good condition.  Follow-up Plans & Appointments     Follow-up Information     Revankar, Aundra Dubin, MD Follow up.   Specialty: Cardiology Why: Our office will call you to schedule a hospital follow-up visit. If you do not hear from [place] within 2 business days, please call our office to schedule this. Contact information: [address] [place] [address] [phone]         [facility] Follow up.   Specialty: Cardiology Why: Please come by our office in 3-4 days for a lab visit so that we can recheck your sodium level. You can come by any time from 8am-4:30pm that works for you (lab typically closed 12-2pm for lunch). Contact information: [address] [place] [address] [phone]               Discharge Instructions     Diet - low sodium heart healthy   Complete by: As directed    Increase activity slowly   Complete by: As directed        Discharge Medications   Allergies as of 11/24/2020   No Known Allergies      Medication List     STOP taking these medications    flecainide 100 MG tablet Commonly known as: TAMBOCOR       TAKE these medications    acetaminophen 325 MG tablet Commonly known as: TYLENOL Take 325-650 mg by mouth daily as needed for headache (pain).   ascorbic acid 500 MG tablet Commonly known as: VITAMIN C Take 500 mg by mouth daily after supper.   colchicine 0.6 MG tablet Take 1 tablet (0.6 mg total) by mouth 2 (two) times daily.   CoQ10 200 MG Caps Take 200 mg by mouth daily after supper.   finasteride 5 MG tablet Commonly known as:  PROSCAR Take 5 mg by mouth daily after supper.   fish oil-omega-3 fatty acids 1000 MG capsule Take 3 g by mouth daily after supper.   furosemide 40 MG tablet Commonly known as: Lasix Take 1 tablet (40 mg total)  by mouth daily as needed (weight gain (3lbs in 1 day or 5lbs in 1 week) or worsening lower extremity edema).   ibuprofen 600 MG tablet Commonly known as: ADVIL Take 1 tablet (600 mg total) by mouth 3 (three) times daily.   lisinopril 10 MG tablet Commonly known as: ZESTRIL Take 10 mg by mouth daily after supper.   loratadine 10 MG tablet Commonly known as: CLARITIN Take 10 mg by mouth daily after supper.   METAMUCIL FIBER PO Take by mouth See admin instructions. Mix 1 heaping teaspoonful of powder into 6-8 ounces of juice and drink in the morning and before supper   multivitamin with minerals Tabs tablet Take 1 tablet by mouth daily after supper.   pantoprazole 40 MG tablet Commonly known as: PROTONIX Take 1 tablet (40 mg total) by mouth daily.   rivaroxaban 20 MG Tabs tablet Commonly known as: XARELTO Take 1 tablet (20 mg total) by mouth daily with supper.   rosuvastatin 5 MG tablet Commonly known as: CRESTOR Take 5 mg by mouth daily after supper.   sodium chloride 1 g tablet Take 1 tablet (1 g total) by mouth 2 (two) times daily with a meal.   TART CHERRY PO Take 30 mLs by mouth 2 (two) times daily with a meal.   Vitamin D3 50 MCG (2000 UT) Tabs Take 2,000 Units by mouth daily after supper.           Outstanding Labs/Studies   Repeat CMET in 3-4 days.  Duration of Discharge Encounter   Greater than 30 minutes including physician time.  Signed, Corrin Parker, PA-C 11/24/2020, 1:17 PM   Agree with note by Marjie Skiff, PA-C  Mr. Weimar was admitted on 11/15/2020 with diastolic heart failure.  His EF was preserved by 2D echo.  He did have a moderate to large pericardial effusion without tamponade physiology as well as a large pleural  effusion.  He was in A. fib on flecainide but converted to sinus rhythm.  He is V paced.  He was diuresed 16 L and felt clinically improved.  He did have a thoracentesis this morning removing 800 cc of fluid.  His exam is benign.  He denies chest pain or shortness of breath.  His serum sodium had improved with sodium repletion.  He stable for discharge home this morning.  We will get a basic metabolic panel in 4 days and arrange for him to see Dr. Tomie China as an outpatient.   Runell Gess, M.D., FACP, [facility], [facility] [address] [place], [place]  [address]  [phone] 11/24/2020 2:26 PM

## 2020-11-24 NOTE — Procedures (Signed)
  Procedure: US guided L thoracentesis EBL:   minimal Complications:  none immediate  See full dictation in [employer].  Thora Lance MD Main # [phone] Pager  [phone] Mobile [phone]

## 2020-11-24 NOTE — Progress Notes (Signed)
PROGRESS NOTE    Ricky Hood  ZOX:[id number] DOB: 30-Jun-1946 DOA: 11/15/2020 PCP: Marlyn CorporalYates, Kate H, PA   Brief Narrative:  Ricky Hood is a 74 y.o. 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  He had recently been started on flecainide and Xarelto by Dr. Johney FrameAllred on 11/08/2020 during a follow-up visit after interrogation of pacemaker noted episodes of atrial fibrillation.  His family noticed that he began looking puffier and reportedly had a 23 pound weight gain.  He follow-up in atrial fibrillation clinic and was noted to be short of breath and had been sent to the hospital for further evaluation.  X-ray imaging revealed pulmonary edema and BNP was elevated at 481.4.  He was admitted to the cardiology service and started on IV diuresis. Echocardiogram 12/18 revealed a large pericardial effusion without tamponade features.  He was started on ibuprofen and colchicine.  Since admission sodium levels had trended from 131 to 134, and then back down to 127.  Since being in the hospital is leg swelling has resolved and he feels like he is back to his normal weight.  Repeat echocardiogram from 12/20 showed reduced pericardial effusion.  Patient reports significant family history of rheumatoid arthritis in both of his parents as well as a son with juvenile rheumatoid arthritis.  He personally had never been diagnosed with rheumatoid arthritis.  He intermittently has some mild back pain.  Denies having any significant joint pains, skin changes, nodules, fevers, chills, falls, trauma history of TB, or recent sick contacts.  [place], but he lives in a residential neighborhood.  Patient also reports that he hikes, but the last take that he ever had to remove it was very small in over  3 years ago.  Patient reports that he had received his booster for COVID-19 on October 29.  He also reported prior history of having low sodium levels in the past for which he was advised by his primary care provider to increase his salt intake.   Assessment & Plan:   New onset Diastolic heart failure: Acute.   -Patient presented with complaints of shortness of breath.  Chest x-ray showed patchy bilateral infiltrates concerning for edema with bilateral pleural effusions. BNP elevated at 481.4.   -Echocardiogram had revealed EF 60-65% with large pericardial effusion present without signs of cardiac tamponade.   -With IV diuretics patient had improvement in lower extremity swelling and weight trended back to near patient's reported baseline.  -Cont. To hold Lasix due to hyponatremia (last dose on 12/20) -Management as per cardiology  Pericardial effusion: Initially seen to have a large pericardial effusion without cardiac tamponade features on echocardiogram.  - Labs revealing sedimentation rate 70 and CRP 172.89 .  TSH: WNL -Patient was started on colchicine and ibuprofen.   -Repeat echocardiogram from 12/20 noting reduction in pericardial effusion size for which pericardiocentesis was not needed. -Rheumatoid factor: Complement level, ANA: All came back within normal limits. -Management as per cardiology  Hyponatremia: Acute.  Sodium levels since admission 131->134->134-> 128->127->127-->125-->127--> 128-->131 -Urine sodium: 110--> 72. 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 -Patient remained asymptomatic.  Okay to discharge patient on sodium chloride 1 g twice daily.  Repeat BMP  in 3 to 4 days outpatient.  Moderate left pleural effusion: -Status post thoracentesis on 12/26 by IR.  850 mL-fluid  removed.  Complete heart block s/p PM -Continue to monitor on telemetry.  Paroxysmal atrial fibrillation on anticoagulation:CHA2DS2-VASc score =3.    Hold Xarelto-for thoracentesis.  On heparin.  Hypoalbuminemia: Acute.  On admission patient's albumin levels were noted to be low 2.3.  Prealbumin: 13.5. -Consult dietitian-continue multivitamins  DVT prophylaxis: SCD, heparin Code Status: Full code Family Communication: None present at bedside.  Plan of care discussed with patient in length and he verbalized understanding and agreed with it. Disposition Plan: To be determined   Procedures:   Echo  Thoracentesis  Antimicrobials:   None  Status is: Inpatient  Remains inpatient appropriate because:Persistent severe electrolyte disturbances   Dispo: The patient is from: Home              Anticipated d/c is to: Home              Anticipated d/c date is: 2 days              Patient currently is not medically stable to d/c.    Subjective: Patient seen and examined.  Sitting comfortably on the bed.  Tells me that he feels good and has no new complaints.  Denies chest pain, shortness of breath, lightheadedness, dizziness, confusion, leg swelling, orthopnea, PND.  He is scheduled for thoracentesis this morning.  He is happy that his sodium is improving.  Objective: Vitals:   11/23/20 2052 11/24/20 0504 11/24/20 0909 11/24/20 0936  BP:   (!) 141/96 (!) 142/68  Pulse:      Resp: 18     Temp: 98.7 F (37.1 C) 97.9 F (36.6 C)    TempSrc: Oral Oral    SpO2:      Weight:      Height:        Intake/Output Summary (Last 24 hours) at 11/24/2020 1010 Last data filed at 11/24/2020 0934 Gross per 24 hour  Intake 517.22 ml  Output 1650 ml  Net -1132.78 ml   Filed Weights   11/22/20 0536 11/23/20 0458 11/23/20 0900  Weight: 76.5 kg 74.8 kg 74.8 kg    Examination:  General exam: Appears calm and comfortable, on room air, communicating well Respiratory system: Decreased  breath sound noted on the left base.  No wheezing, rhonchi or crackles.   Cardiovascular system: S1 & S2 heard, RRR. No JVD, murmurs, rubs, gallops or clicks. No pedal edema. Gastrointestinal system: Abdomen is nondistended, soft and nontender. No organomegaly or masses felt. Normal bowel sounds heard. Central nervous system: Alert and oriented. No focal neurological deficits. Extremities: Symmetric 5 x 5 power. Skin: No rashes, lesions or ulcers Psychiatry: Judgement and insight appear normal. Mood & affect appropriate.    Data Reviewed: I have personally reviewed following labs and imaging studies  CBC: Recent Labs  Lab 11/21/20 0235 11/24/20 0515  WBC 9.1 8.5  HGB 13.8 12.7*  HCT 38.5* 39.5  MCV 85.7 89.6  PLT 525* 442*   Basic Metabolic Panel: Recent Labs  Lab 11/20/20 0244 11/21/20 0235 11/22/20 0602 11/23/20 0152 11/24/20 0515  NA 127* 125* 127* 128* 131*  K 4.9 4.4 4.3 4.6 4.4  CL 92* 92* 93* 94* 96*  CO2 25 24 20* 25 26  GLUCOSE 98 100* 129* 111* 99  BUN 16 16 20  26* 20  CREATININE 0.86 0.87 1.02 1.01 0.96  CALCIUM 8.4* 8.0* 8.5* 8.4* 8.5*  MG  --   --   --   --  2.0   GFR: Estimated Creatinine Clearance: 63.1 mL/min (by C-G formula based on SCr of 0.96 mg/dL). Liver Function Tests: Recent Labs  Lab 11/22/20 0605  AST 23  ALT 43  ALKPHOS 89  BILITOT 0.8  PROT 6.0*  ALBUMIN 2.3*   No results for input(s): LIPASE, AMYLASE in the last 168 hours. No results for input(s): AMMONIA in the last 168 hours. Coagulation Profile: No results for input(s): INR, PROTIME in the last 168 hours. Cardiac Enzymes: No results for input(s): CKTOTAL, CKMB, CKMBINDEX, TROPONINI in the last 168 hours. BNP (last 3 results) No results for input(s): PROBNP in the last 8760 hours. HbA1C: No results for input(s): HGBA1C in the last 72 hours. CBG: No results for input(s): GLUCAP in the last 168 hours. Lipid Profile: No results for input(s): CHOL, HDL, LDLCALC, TRIG,  CHOLHDL, LDLDIRECT in the last 72 hours. Thyroid Function Tests: Recent Labs    11/21/20 1259  TSH 2.956   Anemia Panel: No results for input(s): VITAMINB12, FOLATE, FERRITIN, TIBC, IRON, RETICCTPCT in the last 72 hours. Sepsis Labs: Recent Labs  Lab 11/21/20 0235  PROCALCITON <0.10    Recent Results (from the past 240 hour(s))  Resp Panel by RT-PCR (Flu A&B, Covid) Nasopharyngeal Swab     Status: None   Collection Time: 11/15/20  5:30 PM   Specimen: Nasopharyngeal Swab; Nasopharyngeal(NP) swabs in vial transport medium  Result Value Ref Range Status   SARS Coronavirus 2 by RT PCR NEGATIVE NEGATIVE Final    Comment: (NOTE) SARS-CoV-2 target nucleic acids are NOT DETECTED.  The SARS-CoV-2 RNA is generally detectable in upper respiratory specimens during the acute phase of infection. The lowest concentration of SARS-CoV-2 viral copies this assay can detect is 138 copies/mL. A negative result does not preclude SARS-Cov-2 infection and should not be used as the sole basis for treatment or other patient management decisions. A negative result may occur with  improper specimen collection/handling, submission of specimen other than nasopharyngeal swab, presence of viral mutation(s) within the areas targeted by this assay, and inadequate number of viral copies(<138 copies/mL). A negative result must be combined with clinical observations, patient history, and epidemiological information. The expected result is Negative.  Fact Sheet for Patients:  BloggerCourse.com  Fact Sheet for Healthcare Providers:  SeriousBroker.it  This test is no t yet approved or cleared by the [place] FDA and  has been authorized for detection and/or diagnosis of SARS-CoV-2 by FDA under an Emergency Use Authorization (EUA). This EUA will remain  in effect (meaning this test can be used) for the duration of the COVID-19 declaration under Section  564(b)(1) of the Act, 21 U.S.C.section 360bbb-3(b)(1), unless the authorization is terminated  or revoked sooner.       Influenza A by PCR NEGATIVE NEGATIVE Final   Influenza B by PCR NEGATIVE NEGATIVE Final    Comment: (NOTE) The Xpert Xpress SARS-CoV-2/FLU/RSV plus assay is intended as an aid in the diagnosis of influenza from Nasopharyngeal swab specimens and should not be used as a sole basis for treatment. Nasal washings and aspirates are unacceptable for Xpert Xpress SARS-CoV-2/FLU/RSV testing.  Fact Sheet for Patients: BloggerCourse.com  Fact Sheet for Healthcare Providers: SeriousBroker.it  This test is not yet approved or cleared by the [place] FDA and has been authorized for detection and/or diagnosis of SARS-CoV-2 by FDA under an Emergency Use Authorization (EUA). This EUA will remain in effect (meaning this test  can be used) for the duration of the COVID-19 declaration under Section 564(b)(1) of the Act, 21 U.S.C. section 360bbb-3(b)(1), unless the authorization is terminated or revoked.  Performed at [facility], [address] [place], [place] [address]       Radiology Studies: DG Chest 1 View  Result Date: 11/24/2020 CLINICAL DATA:  Status post left thoracentesis EXAM: CHEST  1 VIEW COMPARISON:  11/23/2020 FINDINGS: No pneumothorax is noted following left thoracentesis. Cardiac shadow remains enlarged. Pacing device is again seen. Right lung is well aerated with small pleural effusion. Calcified granuloma is again noted in the right mid lung. Interval reduction of left-sided pleural effusion is noted. No bony abnormality is seen. IMPRESSION: No evidence of pneumothorax following left-sided thoracentesis. Small right pleural effusion remains. Electronically Signed   By: Alcide Clever M.D.   On: 11/24/2020 10:06   DG CHEST PORT 1 VIEW  Result Date: 11/23/2020 CLINICAL DATA:  Pleural effusion  follow-up EXAM: PORTABLE CHEST 1 VIEW COMPARISON:  Three days ago FINDINGS: Left more than right pleural effusion, likely moderate on the left. Cardiopericardial enlargement which is unchanged. Dual-chamber pacer leads from the left. No pulmonary edema or pneumothorax. Calcified granuloma over the right lower lobe. IMPRESSION: 1. Left more than right pleural effusion 2. No pulmonary edema or pneumothorax. Electronically Signed   By: Marnee Spring M.D.   On: 11/23/2020 11:07   US THORACENTESIS ASP PLEURAL SPACE W/IMG GUIDE  Result Date: 11/24/2020 INDICATION: Shortness of breath EXAM: ULTRASOUND GUIDED LEFT THORACENTESIS MEDICATIONS: Lidocaine 1% subcutaneous COMPLICATIONS: None immediate. PROCEDURE: An ultrasound guided thoracentesis was thoroughly discussed with the patient and questions answered. The benefits, risks, alternatives and complications were also discussed. The patient understands and wishes to proceed with the procedure. Written consent was obtained. Ultrasound was performed to localize and mark an adequate pocket of fluid in the left chest. The area was then prepped and draped in the normal sterile fashion. 1% Lidocaine was used for local anesthesia. Under ultrasound guidance a 6 Fr Safe-T-Centesis catheter was introduced. Thoracentesis was performed. The catheter was removed and a dressing applied. FINDINGS: A total of approximately 850 mL of clear yellow fluid was removed. Samples were sent to the laboratory as requested by the clinical team. IMPRESSION: Successful ultrasound guided left thoracentesis yielding 850 mL of pleural fluid. Electronically Signed   By: Corlis Leak M.D.   On: 11/24/2020 09:51    Scheduled Meds: . colchicine  0.6 mg Oral BID  . ibuprofen  600 mg Oral TID  . lidocaine (PF)      . lisinopril  10 mg Oral QHS  . mouth rinse  15 mL Mouth Rinse BID  . multivitamin with minerals  1 tablet Oral Daily  . pantoprazole  40 mg Oral Daily  . rosuvastatin  5 mg Oral QHS   . sodium chloride flush  3 mL Intravenous Q12H  . sodium chloride  1 g Oral BID WC   Continuous Infusions: . sodium chloride       LOS: 9 days   Time spent: 30 minutes   Chapin Arduini Estill Cotta, MD [facility]  If 7PM-7AM, please contact night-coverage [email or web address] 11/24/2020, 10:10 AM

## 2020-11-24 NOTE — Addendum Note (Signed)
Addended by: Marjie Skiff on: 11/24/2020 01:15 PM   Modules accepted: Orders

## 2020-11-24 NOTE — TOC Transition Note (Signed)
Transition of Care [facility]) - CM/SW Discharge Note   Patient Details  Name: Ricky Hood MRN: [id number] Date of Birth: Aug 10, 1946  Transition of Care [facility] CM/SW Contact:  Kallie Locks, RN Phone Number: [phone] 11/24/2020, 3:39 PM   Clinical Narrative:  Home health PT needed. Spoke with patient's wife to offer choice. No preference. Referral made [facility] Spoke with [employer]. No further needs assessed. Made spouse [name] and nurse aware of arrangements.     Final next level of care: Home w Home Health Services Barriers to Discharge: No Barriers Identified   Patient Goals and CMS Choice Patient states their goals for this hospitalization and ongoing recovery are:: return home CMS Medicare.gov Compare Post Acute Care list provided to:: Other (Comment Required) (spouse- [name]) Choice offered to / list presented to : Spouse  Discharge Placement                       Discharge Plan and Services In-house Referral: NA Discharge Planning Services: CM Consult            DME Arranged: N/A DME Agency: NA       HH Arranged: PT HH Agency: [facility] Date HH Agency Contacted: 11/24/20 Time HH Agency Contacted: 1538 Representative spoke with at [facility] Agency: Barbara Cower  Social Determinants of Health (SDOH) Interventions     Readmission Risk Interventions No flowsheet data found.    Raiford Noble, MSN, RN,BSN Inpatient [facility] Case Manager [phone]

## 2020-11-24 NOTE — Progress Notes (Addendum)
Progress Note  Patient Name: Ricky GentileStanley Corne Date of Encounter: 11/24/2020  [facility] Cardiologist: Garwin Brothersajan R Revankar, MD   Subjective   No acute overnight events. Doing well this morning. No complaints of shortness of breath, chest pain, or palpitations.  At this point, just waiting on IR to perform thoracentesis. I spoke with IR who said they should be able to do this today.  Inpatient Medications    Scheduled Meds: . colchicine  0.6 mg Oral BID  . ibuprofen  600 mg Oral TID  . lisinopril  10 mg Oral QHS  . mouth rinse  15 mL Mouth Rinse BID  . multivitamin with minerals  1 tablet Oral Daily  . pantoprazole  40 mg Oral Daily  . rosuvastatin  5 mg Oral QHS  . sodium chloride flush  3 mL Intravenous Q12H  . sodium chloride  1 g Oral BID WC   Continuous Infusions: . sodium chloride    . heparin 1,450 Units/hr (11/24/20 0618)   PRN Meds: sodium chloride, acetaminophen, ondansetron (ZOFRAN) IV, psyllium, sodium chloride flush   Vital Signs    Vitals:   11/23/20 1203 11/23/20 2021 11/23/20 2052 11/24/20 0504  BP: 101/81     Pulse:  72    Resp:  16 18   Temp: 97.9 F (36.6 C)  98.7 F (37.1 C) 97.9 F (36.6 C)  TempSrc: Oral  Oral Oral  SpO2:  96%    Weight:      Height:        Intake/Output Summary (Last 24 hours) at 11/24/2020 0751 Last data filed at 11/24/2020 0501 Gross per 24 hour  Intake 464.4 ml  Output 1650 ml  Net -1185.6 ml   Last 3 Weights 11/23/2020 11/23/2020 11/22/2020  Weight (lbs) 164 lb 14.5 oz 164 lb 14.4 oz 168 lb 10.4 oz  Weight (kg) 74.8 kg 74.798 kg 76.5 kg      Telemetry    Ventricular paced rhythm with what looks like underlying sinus rhythm. - Personally Reviewed  ECG    No new ECG tracing today. - Personally Reviewed  Physical Exam   GEN: No acute distress.   Neck: Supple. No JVD Cardiac: RRR. No murmurs, rubs, or gallops.  Respiratory: Mild decreased breath sounds in left base. Otherwise, clear to ausculation. No  wheezes, rhonchi, or rales. GI: Soft, non-tender, non-distended  MS: No lower extremity edema. No deformity. Skin: Warm and dry. Neuro:  No focal deficits. Psych: Normal affect. Responds appropriately.  Labs    High Sensitivity Troponin:   Recent Labs  Lab 11/15/20 1639  TROPONINIHS 18*      Chemistry Recent Labs  Lab 11/22/20 0602 11/22/20 0605 11/23/20 0152 11/24/20 0515  NA 127*  --  128* 131*  K 4.3  --  4.6 4.4  CL 93*  --  94* 96*  CO2 20*  --  25 26  GLUCOSE 129*  --  111* 99  BUN 20  --  26* 20  CREATININE 1.02  --  1.01 0.96  CALCIUM 8.5*  --  8.4* 8.5*  PROT  --  6.0*  --   --   ALBUMIN  --  2.3*  --   --   AST  --  23  --   --   ALT  --  43  --   --   ALKPHOS  --  89  --   --   BILITOT  --  0.8  --   --  GFRNONAA >60  --  >60 >60  ANIONGAP 14  --  9 9     Hematology Recent Labs  Lab 11/21/20 0235 11/24/20 0515  WBC 9.1 8.5  RBC 4.49 4.41  HGB 13.8 12.7*  HCT 38.5* 39.5  MCV 85.7 89.6  MCH 30.7 28.8  MCHC 35.8 32.2  RDW 12.3 12.6  PLT 525* 442*    BNPNo results for input(s): BNP, PROBNP in the last 168 hours.   DDimer No results for input(s): DDIMER in the last 168 hours.   Radiology    DG CHEST PORT 1 VIEW  Result Date: 11/23/2020 CLINICAL DATA:  Pleural effusion follow-up EXAM: PORTABLE CHEST 1 VIEW COMPARISON:  Three days ago FINDINGS: Left more than right pleural effusion, likely moderate on the left. Cardiopericardial enlargement which is unchanged. Dual-chamber pacer leads from the left. No pulmonary edema or pneumothorax. Calcified granuloma over the right lower lobe. IMPRESSION: 1. Left more than right pleural effusion 2. No pulmonary edema or pneumothorax. Electronically Signed   By: Marnee Spring M.D.   On: 11/23/2020 11:07    Cardiac Studies   CompleteEchocardiogram 12/16/20: Impressions: 1. There is a large pericardial effusion present measuring 2.6cm at end  diastole. Appears to be largest at the LV apex. The is  about 25%  respiratory variation across the mitral valve. IVC is dilated but  collapses >50% with inspiration suggesting RAP  . There is no RV/RA diastolic collapse. Overall, no tamponade  physiology present.  2. Left ventricular ejection fraction, by estimation, is 60 to 65%. The  left ventricle has normal function. The left ventricle has no regional  wall motion abnormalities. There is mild concentric left ventricular  hypertrophy. Left ventricular diastolic  parameters are consistent with Grade I diastolic dysfunction (impaired  relaxation).  3. Right ventricular systolic function is normal. The right ventricular  size is normal. There is moderately elevated pulmonary artery systolic  pressure.  4. The mitral valve is normal in structure. Mild mitral valve  regurgitation.  5. Tricuspid valve regurgitation is moderate.  6. The aortic valve is tricuspid. There is mild calcification of the  aortic valve. There is mild thickening of the aortic valve. Aortic valve  regurgitation is mild. Mild aortic valve sclerosis is present, with no  evidence of aortic valve stenosis.  7. Aortic dilatation noted. There is mild dilatation of the aortic root,  measuring 43 mm. There is mild to moderate dilatation of the ascending  aorta, measuring 43 mm.  8. The inferior vena cava is dilated in size with >50% respiratory  variability, suggesting right atrial pressure of 8 mmHg.   Comparison(s): Compared to prior echo on 09/17/20, there is now a large  pericardial effusion present. Findings discussed with Dr. Wyline Mood.  _______________  Limited Echocardiogram 11/18/2020: Impressions: 1. Limited echo for pericardial effusion  2. Right ventricular systolic function is normal. The right ventricular  size is normal. There is normal pulmonary artery systolic pressure. The  estimated right ventricular systolic pressure is 33.8 mmHg.  3. Small to moderate-sized pericardial effusion. The  pericardial effusion  is circumferential. There is no evidence of cardiac tamponade.  4. The aortic valve is tricuspid. Aortic valve regurgitation is trivial.  5. Aortic dilatation noted. There is mild dilatation of the aortic root,  measuring 41 mm. There is moderate dilatation of the ascending aorta,  measuring 45 mm.  6. The inferior vena cava is dilated in size with >50% respiratory  variability, suggesting right atrial pressure of 8 mmHg.  7. Left ventricular ejection fraction, by estimation, is 60 to 65%. The  left ventricle has normal function.   Comparison(s):Changes from prior study are noted. 11/16/2020: LVEF  60-65%, large pericardial effusion. Compared to the prior study, the  pericardial effusion is smaller.   Patient Profile     74 y.o. 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  Assessment & Plan    New Onset Acute Diastolic CHF - Presented with shortness of breath and 21lb weight gain.  - BNP elevated at 481. - Chest x-ray showed patchy bilateral pulmonary opacities and new moderate left and trace right pleural effusions. - Echo showed LVEF of 60-65% with normal wall motion, grade 1 diastolic dysfunction, mild MR, moderate TR, moderately elevated PASP as well as a new large pericardial effusion. -Initially diuresed with IV Lasix but this was stopped due to hyponatremia. Last dose 11/18/2020. Net negative 16.8 L this admission.  -Appears euvolemic on exam.  - Continue to monitor daily weights, strict I/O's, and renal function.  Pericardial Effusion - Echo this admission showed large pericardial effusion measuring 2.6cm at end diastole. Largest at the LV  apex. No evidence of tamponade. This was new from Echo in 08/2020.Repeat Echo on 12/20 showed improvement in effusion - considered small to moderate. - Patient is hemodynamically stable. - TSH normal. - Sed rate elevated at 70. High sensitivity CRPelevated at 172. - ANA, rheumatoid factor, complement total normal. - He was started on Ibuprofen and Colchicine.  Left Pleural Effusion - Repeat chest x-ray on 12/25 showed bilateral pleural effusion (likely moderate on left). No pulmonary edema. - PCCM was consulted and planning thoracentesis. Spoke with IR and this should be able to be done today.  Paroxysmal Atrial Fibrillation - Diagnosed on 11/08/2020 and started on Flecainide which has now been stopped in setting of CHF.  -Telemetry shows ventricular paced rhythm with underlying sinus rhythm.  - Xareltocurrently being held for thoracentesis. Last dose of Xarelto was 11/20/2020. Will start IV Heparin since it does not sound like procedure will be done today.  Complete Heart Block s/p PPM - Followed by Dr. Johney Frame.  Dilated Aortic Root/ Ascending Aorta - Echo showed aortic root measuring 66mm and ascending aorta measuring 48mm.  - Consider chest CTA as outpatient for further evaluation.  Hypertension - BPwell controlled. - Continue home Lisinopril 10mg  daily.  Hyperlipidemia - Continue home Crestor.  Hyponatremia - Sodium improving with sodium tablets - 131 today. Nadir 125 on 11/21/2020.  - Urine sodium 110 >> 72. Urine Osmolality 586 >> 367. - Cosyuntropin stimulation test showed increase in cortisol (no adrenal insufficiency).  - [facility] was consulted and is helping with this. Dr. 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  - Placed on sodium chloride tablets on 12/24. - Management per [facility].  For questions or updates, please contact [facility] Please consult [email or web address] for contact info under         Signed, 1/25, PA-C  11/24/2020, 7:51 AM    Agree with note by 11/26/2020, PA-C  Looks great this morning.  Going down for thoracentesis by interventional radiology.  Xarelto was on hold.  His serum sodium has come up to 131.  His renal function is stable.  He is in sinus rhythm, V paced on antiarrhythmic therapy.  His exam is benign.  I think he can probably go home later this afternoon after he rate remained stable from his thoracentesis.  Restart Xarelto tomorrow.  Follow-up with Dr. Tomie China.  He can take Lasix as needed weight gain.   Runell Gess, M.D., FACP, [facility], [facility] [address] [place], [place]  [address]  [phone] 11/24/2020 8:36 AM

## 2020-11-24 NOTE — Progress Notes (Addendum)
Ordered repeat outpatient CMET for follow-up of hyponatremia and hypoalbuminemia.

## 2020-11-25 LAB — ACID FAST SMEAR (AFB, MYCOBACTERIA): Acid Fast Smear: NEGATIVE

## 2020-11-26 LAB — TRIGLYCERIDES, BODY FLUIDS: Triglycerides, Fluid: 18 mg/dL

## 2020-11-28 ENCOUNTER — Other Ambulatory Visit: Payer: Self-pay

## 2020-11-28 DIAGNOSIS — E871 Hypo-osmolality and hyponatremia: Secondary | ICD-10-CM

## 2020-11-28 LAB — PH, BODY FLUID: pH, Body Fluid: 7.6

## 2020-11-28 LAB — CHOLESTEROL, BODY FLUID: Cholesterol, Fluid: 75 mg/dL

## 2020-11-29 ENCOUNTER — Telehealth: Payer: Self-pay | Admitting: Student

## 2020-11-29 LAB — COMPREHENSIVE METABOLIC PANEL
ALT: 32 IU/L (ref 0–44)
AST: 22 IU/L (ref 0–40)
Albumin/Globulin Ratio: 1.1 — ABNORMAL LOW (ref 1.2–2.2)
Albumin: 3.4 g/dL — ABNORMAL LOW (ref 3.7–4.7)
Alkaline Phosphatase: 99 IU/L (ref 44–121)
BUN/Creatinine Ratio: 20 (ref 10–24)
BUN: 23 mg/dL (ref 8–27)
Bilirubin Total: 0.3 mg/dL (ref 0.0–1.2)
CO2: 24 mmol/L (ref 20–29)
Calcium: 9.2 mg/dL (ref 8.6–10.2)
Chloride: 103 mmol/L (ref 96–106)
Creatinine, Ser: 1.14 mg/dL (ref 0.76–1.27)
GFR calc Af Amer: 73 mL/min/{1.73_m2} (ref >59)
GFR calc non Af Amer: 63 mL/min/{1.73_m2} (ref >59)
Globulin, Total: 3 g/dL (ref 1.5–4.5)
Glucose: 141 mg/dL — ABNORMAL HIGH (ref 65–99)
Potassium: 4.7 mmol/L (ref 3.5–5.2)
Sodium: 141 mmol/L (ref 134–144)
Total Protein: 6.4 g/dL (ref 6.0–8.5)

## 2020-11-29 LAB — CULTURE, BODY FLUID W GRAM STAIN -BOTTLE: Culture: NO GROWTH

## 2020-11-29 NOTE — Telephone Encounter (Addendum)
   Called and left message for patient patient to call back to discuss CMET results from yesterday and new recommendations. Please see result note below for more details if patient calls back:  Labs look better compared to discharge labs. Blood sugar is mildly elevated but suspect patient was not fasting for these labs which is OK. Sodium has improved and is now in the normal range. I think patient ultimately needs to follow-up with PCP for hyponatremia (low sodium) and long term management of this. However, in the meantime, I think we can reduce sodium chloride tablet to every other day. He should probably then have repeat BMET in another 3-4 days to ensure sodium is stable with this change (I want to make sure he does not develop hypernatremia). He should continue to follow low sodium diet and restrict fluids to 1.5L per day. Of note, his albumin (main protein made in the liver) is slightly low but much improved from where it was in the hospital which is good. He can follow-up with PCP on this as well. He should follow-up with Dr. Tomie China as scheduled.   Thank you! Fara Worthy  Addendum 12/02/2020: I spoke with Dr. Allyson Sabal this morning who saw patient during recent admission. He recommends stopping sodium chloride tablet completely given sodium level is now normal. He has an appointment with Dr. Tomie China next week so repeat BMET can be checked at that time.  Corrin Parker, PA-C 12/02/2020 7:45 AM

## 2020-12-02 ENCOUNTER — Telehealth: Payer: Self-pay | Admitting: Internal Medicine

## 2020-12-02 NOTE — Telephone Encounter (Signed)
Ricky Parker, PA-C  Physician Assistant Certified  Cardiology  Telephone Encounter  Addendum  Encounter Date:  11/29/2020               Called and left message for patient patient to call back to discuss CMET results from yesterday and new recommendations. Please see result note below for more details if patient calls back:  Labs look better compared to discharge labs. Blood sugar is mildly elevated but suspect patient was not fasting for these labs which is OK. Sodium has improved and is now in the normal range. I think patient ultimately needs to follow-up with PCP for hyponatremia (low sodium) and long term management of this. However, in the meantime, I think we can reduce sodium chloride tablet to every other day. He should probably then have repeat BMET in another 3-4 days to ensure sodium is stable with this change (I want to make sure he does not develop hypernatremia). He should continue to follow low sodium diet and restrict fluids to 1.5L per day. Of note, his albumin (main protein made in the liver) is slightly low but much improved from where it was in the hospital which is good. He can follow-up with PCP on this as well. He should follow-up with Dr. Tomie China as scheduled.   Thank you! Callie  Addendum 12/02/2020: I spoke with Dr. Allyson Sabal this morning who saw patient during recent admission. He recommends stopping sodium chloride tablet completely given sodium level is now normal. He has an appointment with Dr. Tomie China next week so repeat BMET can be checked at that time.  Ricky Parker, PA-C 12/02/2020 7:45 AM      Read the note from [facility] to the patient he verbalized understanding.

## 2020-12-02 NOTE — Telephone Encounter (Signed)
Pt c/o medication issue:  1. Name of Medication: sodium chloride 1 g tablet  2. How are you currently taking this medication (dosage and times per day)? As directed   3. Are you having a reaction (difficulty breathing--STAT)? no  4. What is your medication issue? Patient wanted to know when to stop taking this medication. He was put on it in the hospital

## 2020-12-03 ENCOUNTER — Other Ambulatory Visit: Payer: Self-pay

## 2020-12-03 DIAGNOSIS — E871 Hypo-osmolality and hyponatremia: Secondary | ICD-10-CM

## 2020-12-03 DIAGNOSIS — Z79899 Other long term (current) drug therapy: Secondary | ICD-10-CM

## 2020-12-06 ENCOUNTER — Other Ambulatory Visit: Payer: Self-pay

## 2020-12-06 DIAGNOSIS — E7800 Pure hypercholesterolemia, unspecified: Secondary | ICD-10-CM | POA: Insufficient documentation

## 2020-12-06 DIAGNOSIS — I441 Atrioventricular block, second degree: Secondary | ICD-10-CM | POA: Insufficient documentation

## 2020-12-06 LAB — BASIC METABOLIC PANEL
BUN/Creatinine Ratio: 25 — ABNORMAL HIGH (ref 10–24)
BUN: 32 mg/dL — ABNORMAL HIGH (ref 8–27)
CO2: 27 mmol/L (ref 20–29)
Calcium: 9.3 mg/dL (ref 8.6–10.2)
Chloride: 107 mmol/L — ABNORMAL HIGH (ref 96–106)
Creatinine, Ser: 1.26 mg/dL (ref 0.76–1.27)
GFR calc Af Amer: 65 mL/min/{1.73_m2} (ref >59)
GFR calc non Af Amer: 56 mL/min/{1.73_m2} — ABNORMAL LOW (ref >59)
Glucose: 105 mg/dL — ABNORMAL HIGH (ref 65–99)
Potassium: 3.7 mmol/L (ref 3.5–5.2)
Sodium: 148 mmol/L — ABNORMAL HIGH (ref 134–144)

## 2020-12-09 ENCOUNTER — Ambulatory Visit: Payer: Medicare Other | Admitting: Cardiology

## 2020-12-09 ENCOUNTER — Encounter: Payer: Self-pay | Admitting: Cardiology

## 2020-12-09 ENCOUNTER — Other Ambulatory Visit: Payer: Self-pay

## 2020-12-09 VITALS — BP 132/72 | HR 87 | Ht 67.0 in | Wt 165.8 lb

## 2020-12-09 DIAGNOSIS — Z950 Presence of cardiac pacemaker: Secondary | ICD-10-CM

## 2020-12-09 DIAGNOSIS — I313 Pericardial effusion (noninflammatory): Secondary | ICD-10-CM

## 2020-12-09 DIAGNOSIS — I712 Thoracic aortic aneurysm, without rupture: Secondary | ICD-10-CM

## 2020-12-09 DIAGNOSIS — I3139 Other pericardial effusion (noninflammatory): Secondary | ICD-10-CM

## 2020-12-09 DIAGNOSIS — I7121 Aneurysm of the ascending aorta, without rupture: Secondary | ICD-10-CM

## 2020-12-09 DIAGNOSIS — I77810 Thoracic aortic ectasia: Secondary | ICD-10-CM

## 2020-12-09 DIAGNOSIS — I480 Paroxysmal atrial fibrillation: Secondary | ICD-10-CM | POA: Diagnosis not present

## 2020-12-09 DIAGNOSIS — I5031 Acute diastolic (congestive) heart failure: Secondary | ICD-10-CM | POA: Diagnosis not present

## 2020-12-09 DIAGNOSIS — I442 Atrioventricular block, complete: Secondary | ICD-10-CM

## 2020-12-09 DIAGNOSIS — G4733 Obstructive sleep apnea (adult) (pediatric): Secondary | ICD-10-CM

## 2020-12-09 DIAGNOSIS — E7800 Pure hypercholesterolemia, unspecified: Secondary | ICD-10-CM

## 2020-12-09 MED ORDER — RIVAROXABAN 20 MG PO TABS: 20.0000 mg | ORAL_TABLET | Freq: Every day | ORAL | 3 refills | Status: DC

## 2020-12-09 NOTE — Patient Instructions (Addendum)
Medication Instructions:  No medication changes. *If you need a refill on your cardiac medications before your next appointment, please call your pharmacy*   Lab Work: Your physician recommends that you return for lab work in: 2 weeks (12/23/20). You need to have labs done when you are fasting.  You can come Monday through Friday 8:30 am to 12:00 pm and 1:15 to 4:30. You do not need to make an appointment as the order has already been placed. You will have a BMET done.  If you have labs (blood work) drawn today and your tests are completely normal, you will receive your results only by: Marland Kitchen MyChart Message (if you have MyChart) OR . A paper copy in the mail If you have any lab test that is abnormal or we need to change your treatment, we will call you to review the results.   Testing/Procedures: Your physician has requested that you have an echocardiogram. Echocardiography is a painless test that uses sound waves to create images of your heart. It provides your doctor with information about the size and shape of your heart and how well your heart's chambers and valves are working. This procedure takes approximately one hour. There are no restrictions for this procedure.     Follow-Up: At [facility], you and your health needs are our priority.  As part of our continuing mission to provide you with exceptional heart care, we have created designated Provider Care Teams.  These Care Teams include your primary Cardiologist (physician) and Advanced Practice Providers (APPs -  Physician Assistants and Nurse Practitioners) who all work together to provide you with the care you need, when you need it.  We recommend signing up for the patient portal called "MyChart".  Sign up information is provided on this After Visit Summary.  MyChart is used to connect with patients for Virtual Visits (Telemedicine).  Patients are able to view lab/test results, encounter notes, upcoming appointments, etc.   Non-urgent messages can be sent to your provider as well.   To learn more about what you can do with MyChart, go to [email or web address].    Your next appointment:   1 month(s)  The format for your next appointment:   In Person  Provider:   Belva Crome, MD   Other Instructions  Echocardiogram An echocardiogram is a test that uses sound waves (ultrasound) to produce images of the heart. Images from an echocardiogram can provide important information about:  Heart size and shape.  The size and thickness and movement of your heart's walls.  Heart muscle function and strength.  Heart valve function or if you have stenosis. Stenosis is when the heart valves are too narrow.  If blood is flowing backward through the heart valves (regurgitation).  A tumor or infectious growth around the heart valves.  Areas of heart muscle that are not working well because of poor blood flow or injury from a heart attack.  Aneurysm detection. An aneurysm is a weak or damaged part of an artery wall. The wall bulges out from the normal force of blood pumping through the body. Tell a health care provider about:  Any allergies you have.  All medicines you are taking, including vitamins, herbs, eye drops, creams, and over-the-counter medicines.  Any blood disorders you have.  Any surgeries you have had.  Any medical conditions you have.  Whether you are pregnant or may be pregnant. What are the risks? Generally, this is a safe test. However, problems may occur, including an allergic  reaction to dye (contrast) that may be used during the test. What happens before the test? No specific preparation is needed. You may eat and drink normally. What happens during the test?  You will take off your clothes from the waist up and put on a hospital gown.  Electrodes or electrocardiogram (ECG)patches may be placed on your chest. The electrodes or patches are then connected to a device that  monitors your heart rate and rhythm.  You will lie down on a table for an ultrasound exam. A gel will be applied to your chest to help sound waves pass through your skin.  A handheld device, called a transducer, will be pressed against your chest and moved over your heart. The transducer produces sound waves that travel to your heart and bounce back (or "echo" back) to the transducer. These sound waves will be captured in real-time and changed into images of your heart that can be viewed on a video monitor. The images will be recorded on a computer and reviewed by your health care provider.  You may be asked to change positions or hold your breath for a short time. This makes it easier to get different views or better views of your heart.  In some cases, you may receive contrast through an IV in one of your veins. This can improve the quality of the pictures from your heart. The procedure may vary among health care providers and hospitals.   What can I expect after the test? You may return to your normal, everyday life, including diet, activities, and medicines, unless your health care provider tells you not to do that. Follow these instructions at home:  It is up to you to get the results of your test. Ask your health care provider, or the department that is doing the test, when your results will be ready.  Keep all follow-up visits. This is important. Summary  An echocardiogram is a test that uses sound waves (ultrasound) to produce images of the heart.  Images from an echocardiogram can provide important information about the size and shape of your heart, heart muscle function, heart valve function, and other possible heart problems.  You do not need to do anything to prepare before this test. You may eat and drink normally.  After the echocardiogram is completed, you may return to your normal, everyday life, unless your health care provider tells you not to do that. This information is  not intended to replace advice given to you by your health care provider. Make sure you discuss any questions you have with your health care provider. Document Revised: 07/09/2020 Document Reviewed: 07/09/2020 Elsevier Patient Education  2021 [employer].

## 2020-12-09 NOTE — Progress Notes (Signed)
Cardiology Office Note:    Date:  12/09/2020   ID:  Ricky Hood, DOB 24-Aug-1946, MRN [id number]  PCP:  Marlyn Corporal, PA  Cardiologist:  Garwin Brothers, MD   Referring MD: Marlyn Corporal, PA    ASSESSMENT:    1. Pericardial effusion   2. Paroxysmal atrial fibrillation (HCC)   3. Acute diastolic heart failure (HCC)   4. Aneurysm of ascending aorta (HCC)   5. Complete heart block (HCC)   6. Dilated aortic root (HCC)   7. Hypercholesteremia   8. Obstructive sleep apnea (adult) (pediatric)   9. Presence of permanent cardiac pacemaker   10. St Jude Medical Assurity MRI conditional  dual-chamber pacemaker for symptomatic complete heart block      PLAN:    In order of problems listed above:  1. Primary prevention stressed with the patient.  Importance of compliance with diet medication stressed any vocalized understanding. 2. Heart failure: Diastolic in nature.  Diet was emphasized.  He promises to do better.  He is lost weight and exercising regularly.  He weighs himself on a regular basis and has brought a sheet for me 3. Paroxysmal atrial fibrillation:I discussed with the patient atrial fibrillation, disease process. Management and therapy including rate and rhythm control, anticoagulation benefits and potential risks were discussed extensively with the patient. Patient had multiple questions which were answered to patient's satisfaction. 4. Dilated aortic root: I will discuss this in the next appointment.  I talked to them about it today.  I will get a CT scan of the chest without contrast to assess this in a month 5. Hyponatremia: Patient was given sodium tablets and the sodium has come up significantly so that has been discontinued.  I would like him to come back in 2 weeks for a Chem-7. 6. Pericardial effusion echocardiogram will be done to assess this further. 7. Complete heart block permanent pacemaker: We will schedule him for electrophysiology appointment here locally at our  clinic as the patient and wife desires. 8. Follow-up appointment in 4 weeks or earlier if he has any concerns.  Sleep health issues were discussed.  I reviewed previous records extensively since this is the first time I am seeing him.   Medication Adjustments/Labs and Tests Ordered: Current medicines are reviewed at length with the patient today.  Concerns regarding medicines are outlined above.  No orders of the defined types were placed in this encounter.  No orders of the defined types were placed in this encounter.    No chief complaint on file.    History of Present Illness:    Ricky Hood is a 75 y.o. male.  Patient has past medical history of complete heart block for which he underwent pacemaker insertion.  Subsequently he was found to be in diastolic heart failure and treated.  He had pleural effusions which were tapped.  I reviewed those reports.  Patient also has a pericardial effusion.  He has sleep apnea and paroxysmal atrial fibrillation.  He denies any problems at this time and takes care of activities of daily living.  No chest pain orthopnea or PND he ambulates about 45 minutes without any problems.  At the time of my evaluation, the patient is alert awake oriented and in no distress.  Past Medical History:  Diagnosis Date  . Acute diastolic heart failure (HCC) 11/15/2020  . Aneurysm of ascending aorta (HCC) 11/24/2020  . Complete heart block (HCC) 09/16/2020  . Dilated aortic root (HCC) 11/24/2020  . Hypercholesteremia   .  Hyperlipidemia 11/24/2020  . 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 Hypertension   . Hyponatremia 11/24/2020  . Obstructive sleep apnea (adult) (pediatric) 08/10/2014  . Paroxysmal atrial fibrillation (HCC)   . Pericardial effusion 11/24/2020  . Pleural effusion   . 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 Second degree AV block   . Snoring 08/10/2014  . St Jude Medical Assurity MRI conditional  dual-chamber pacemaker for symptomatic complete  heart block   09/24/2020    Past Surgical History:  Procedure Laterality Date  . BIOPSY PROSTATE    . COLONOSCOPY    . PACEMAKER IMPLANT N/A 09/17/2020   SJM Assurity DR implanted by Dr Johney Frame for AV block and syncope    Current Medications: Current Meds  Medication Sig  . acetaminophen (TYLENOL) 325 MG tablet Take 325-650 mg by mouth daily as needed for headache (pain).  Marland Kitchen ascorbic acid (VITAMIN C) 500 MG tablet Take 500 mg by mouth daily after supper.  . Cholecalciferol (VITAMIN D3) 50 MCG (2000 UT) TABS Take 2,000 Units by mouth daily after supper.  . Coenzyme Q10 (COQ10) 50 MG CAPS Take 50 mg by mouth daily.  . colchicine 0.6 MG tablet Take 1 tablet (0.6 mg total) by mouth 2 (two) times daily.  . finasteride (PROSCAR) 5 MG tablet Take 5 mg by mouth daily after supper.  . fish oil-omega-3 fatty acids 1000 MG capsule Take 3 g by mouth daily after supper.  . furosemide (LASIX) 40 MG tablet TAKE 1 TABLET BY BY MOUTH DAILY AS NEEDED FOR WEIGHT GAIN OF 3 POUNS IN 1 DAY OR 5 POUNDS IN ONE WEEK OR WORSENING LOWER LEG EDEMA  . ibuprofen (ADVIL) 600 MG tablet Take 1 tablet (600 mg total) by mouth 3 (three) times daily.  Marland Kitchen lisinopril (ZESTRIL) 10 MG tablet Take 10 mg by mouth daily after supper.  . loratadine (CLARITIN) 10 MG tablet Take 10 mg by mouth daily after supper.  . Multiple Vitamin (MULTIVITAMIN WITH MINERALS) TABS tablet Take 1 tablet by mouth daily after supper.  . Pantoprazole Sodium (PROTONIX PO) Take 100 mg by mouth daily in the afternoon.  . Psyllium (METAMUCIL FIBER PO) Take by mouth See admin instructions. Mix 1 heaping teaspoonful of powder into 6-8 ounces of juice and drink in the morning and before supper  . rivaroxaban (XARELTO) 20 MG TABS tablet Take 1 tablet (20 mg total) by mouth daily with supper.  . rosuvastatin (CRESTOR) 5 MG tablet Take 5 mg by mouth daily after supper.     Allergies:   Patient has no known allergies.   Social History   Socioeconomic History   . Marital status: Married    Spouse name: Not on file  . Number of children: 3  . Years of education: Not on file  . Highest education level: Not on file  Occupational History  . Not on file  Tobacco Use  . Smoking status: Never Smoker  . Smokeless tobacco: Never Used  Vaping Use  . Vaping Use: Never used  Substance and Sexual Activity  . Alcohol use: Never  . Drug use: Never  . Sexual activity: Not on file  Other Topics Concern  . Not on file  Social History Narrative  . Not on file   Social Determinants of Health   Financial Resource Strain: Not on file  Food Insecurity: Not on file  Transportation Needs: Not on file  Physical Activity: Not on file  Stress: Not on file  Social Connections: Not on file  Family History: The patient's family history includes Hyperlipidemia in his father and mother; Hypertension in his father and mother; Other in his mother.  ROS:   Please see the history of present illness.    All other systems reviewed and are negative.  EKGs/Labs/Other Studies Reviewed:    The following studies were reviewed today: I discussed my findings with the patient at extensive length.  EKG reveals sinus rhythm with atrial sensing.   Recent Labs: 11/16/2020: B Natriuretic Peptide 481.4 11/21/2020: TSH 2.956 11/24/2020: Hemoglobin 12.7; Magnesium 2.0; Platelets 442 11/28/2020: ALT 32 12/06/2020: BUN 32; Creatinine, Ser 1.26; Potassium 3.7; Sodium 148  Recent Lipid Panel No results found for: CHOL, TRIG, HDL, CHOLHDL, VLDL, LDLCALC, LDLDIRECT  Physical Exam:    VS:  BP 132/72   Pulse 87   Ht 5\' 7"  (1.702 m)   Wt 165 lb 12.8 oz (75.2 kg)   SpO2 98%   BMI 25.97 kg/m     Wt Readings from Last 3 Encounters:  12/09/20 165 lb 12.8 oz (75.2 kg)  11/23/20 164 lb 14.5 oz (74.8 kg)  11/15/20 189 lb 6.4 oz (85.9 kg)     GEN: Patient is in no acute distress HEENT: Normal NECK: No JVD; No carotid bruits LYMPHATICS: No lymphadenopathy CARDIAC: Hear  sounds regular, 2/6 systolic murmur at the apex. RESPIRATORY:  Clear to auscultation without rales, wheezing or rhonchi  ABDOMEN: Soft, non-tender, non-distended MUSCULOSKELETAL:  No edema; No deformity  SKIN: Warm and dry NEUROLOGIC:  Alert and oriented x 3 PSYCHIATRIC:  Normal affect   Signed, 11/17/20, MD  12/09/2020 10:25 AM    [facility]

## 2020-12-18 ENCOUNTER — Ambulatory Visit (INDEPENDENT_AMBULATORY_CARE_PROVIDER_SITE_OTHER): Payer: Medicare Other

## 2020-12-18 DIAGNOSIS — I5031 Acute diastolic (congestive) heart failure: Secondary | ICD-10-CM

## 2020-12-19 LAB — CUP PACEART REMOTE DEVICE CHECK
Battery Remaining Longevity: 59 mo
Battery Remaining Percentage: 95.5 %
Battery Voltage: 2.99 V
Brady Statistic AP VP Percent: 27 %
Brady Statistic AP VS Percent: 1 %
Brady Statistic AS VP Percent: 72 %
Brady Statistic AS VS Percent: 1 %
Brady Statistic RA Percent Paced: 25 %
Brady Statistic RV Percent Paced: 99 %
Date Time Interrogation Session: 20220119040013
Implantable Lead Implant Date: 20211019
Implantable Lead Implant Date: 20211019
Implantable Lead Location: 753859
Implantable Lead Location: 753860
Implantable Pulse Generator Implant Date: 20211019
Lead Channel Impedance Value: 390 Ohm
Lead Channel Impedance Value: 480 Ohm
Lead Channel Pacing Threshold Amplitude: 0.75 V
Lead Channel Pacing Threshold Amplitude: 1 V
Lead Channel Pacing Threshold Pulse Width: 0.5 ms
Lead Channel Pacing Threshold Pulse Width: 0.5 ms
Lead Channel Sensing Intrinsic Amplitude: 12 mV
Lead Channel Sensing Intrinsic Amplitude: 2.9 mV
Lead Channel Setting Pacing Amplitude: 3.5 V
Lead Channel Setting Pacing Amplitude: 3.5 V
Lead Channel Setting Pacing Pulse Width: 0.5 ms
Lead Channel Setting Sensing Sensitivity: 4 mV
Pulse Gen Model: 2272
Pulse Gen Serial Number: 6220383

## 2020-12-20 ENCOUNTER — Encounter: Payer: Medicare Other | Admitting: Internal Medicine

## 2020-12-26 ENCOUNTER — Ambulatory Visit: Payer: Medicare Other | Admitting: Student

## 2020-12-26 LAB — BASIC METABOLIC PANEL
BUN/Creatinine Ratio: 20 (ref 10–24)
BUN: 22 mg/dL (ref 8–27)
CO2: 26 mmol/L (ref 20–29)
Calcium: 9.1 mg/dL (ref 8.6–10.2)
Chloride: 105 mmol/L (ref 96–106)
Creatinine, Ser: 1.11 mg/dL (ref 0.76–1.27)
GFR calc Af Amer: 75 mL/min/{1.73_m2} (ref >59)
GFR calc non Af Amer: 65 mL/min/{1.73_m2} (ref >59)
Glucose: 85 mg/dL (ref 65–99)
Potassium: 4.1 mmol/L (ref 3.5–5.2)
Sodium: 145 mmol/L — ABNORMAL HIGH (ref 134–144)

## 2020-12-30 NOTE — Progress Notes (Signed)
Remote pacemaker transmission.   

## 2021-01-06 ENCOUNTER — Ambulatory Visit: Payer: Medicare Other | Admitting: Cardiology

## 2021-01-06 ENCOUNTER — Other Ambulatory Visit: Payer: Self-pay

## 2021-01-06 ENCOUNTER — Ambulatory Visit (INDEPENDENT_AMBULATORY_CARE_PROVIDER_SITE_OTHER): Payer: Medicare Other

## 2021-01-06 DIAGNOSIS — I480 Paroxysmal atrial fibrillation: Secondary | ICD-10-CM

## 2021-01-06 DIAGNOSIS — I712 Thoracic aortic aneurysm, without rupture, unspecified: Secondary | ICD-10-CM

## 2021-01-06 DIAGNOSIS — I313 Pericardial effusion (noninflammatory): Secondary | ICD-10-CM | POA: Diagnosis not present

## 2021-01-06 DIAGNOSIS — I3139 Other pericardial effusion (noninflammatory): Secondary | ICD-10-CM

## 2021-01-06 LAB — ECHOCARDIOGRAM COMPLETE
Area-P 1/2: 3.12 cm2
Calc EF: 55.6 %
S' Lateral: 3 cm
Single Plane A2C EF: 57.3 %
Single Plane A4C EF: 51.8 %

## 2021-01-06 NOTE — Progress Notes (Signed)
Complete echocardiogram performed.  Jimmy Tamina Cyphers RDCS, RVT  

## 2021-01-07 LAB — ACID FAST CULTURE WITH REFLEXED SENSITIVITIES (MYCOBACTERIA): Acid Fast Culture: NEGATIVE

## 2021-01-10 ENCOUNTER — Ambulatory Visit: Payer: Medicare Other | Admitting: Cardiology

## 2021-01-10 ENCOUNTER — Encounter: Payer: Self-pay | Admitting: Cardiology

## 2021-01-10 ENCOUNTER — Other Ambulatory Visit: Payer: Self-pay

## 2021-01-10 VITALS — BP 154/86 | HR 76 | Ht 67.0 in | Wt 163.4 lb

## 2021-01-10 DIAGNOSIS — I712 Thoracic aortic aneurysm, without rupture: Secondary | ICD-10-CM | POA: Diagnosis not present

## 2021-01-10 DIAGNOSIS — E7800 Pure hypercholesterolemia, unspecified: Secondary | ICD-10-CM | POA: Diagnosis not present

## 2021-01-10 DIAGNOSIS — I77810 Thoracic aortic ectasia: Secondary | ICD-10-CM

## 2021-01-10 DIAGNOSIS — I442 Atrioventricular block, complete: Secondary | ICD-10-CM | POA: Diagnosis not present

## 2021-01-10 DIAGNOSIS — G4733 Obstructive sleep apnea (adult) (pediatric): Secondary | ICD-10-CM

## 2021-01-10 DIAGNOSIS — I480 Paroxysmal atrial fibrillation: Secondary | ICD-10-CM

## 2021-01-10 DIAGNOSIS — Z950 Presence of cardiac pacemaker: Secondary | ICD-10-CM

## 2021-01-10 DIAGNOSIS — I7121 Aneurysm of the ascending aorta, without rupture: Secondary | ICD-10-CM

## 2021-01-10 NOTE — Progress Notes (Signed)
Cardiology Office Note:    Date:  01/10/2021   ID:  Ricky Hood, DOB 03/03/1946, MRN [id number]  PCP:  Marlyn Corporal, PA  Cardiologist:  Garwin Brothers, MD   Referring MD: Marlyn Corporal, PA    ASSESSMENT:    1. Aneurysm of ascending aorta (HCC)   2. Complete heart block (HCC)   3. Dilated aortic root (HCC)   4. Hypercholesteremia   5. Paroxysmal atrial fibrillation (HCC)   6. Obstructive sleep apnea (adult) (pediatric)   7. Presence of permanent cardiac pacemaker   8. St Jude Medical Assurity MRI conditional  dual-chamber pacemaker for symptomatic complete heart block      PLAN:    In order of problems listed above:  1. Primary prevention stressed with patient.  Importance of compliance with diet medication stressed and he vocalized understanding.  He walks half an hour on a regular basis and is happy about it. 2. Paroxysmal atrial fibrillation:I discussed with the patient atrial fibrillation, disease process. Management and therapy including rate and rhythm control, anticoagulation benefits and potential risks were discussed extensively with the patient. Patient had multiple questions which were answered to patient's satisfaction. 3. Ascending aortic aneurysm: CT of the chest is pending and I will keep an eye on this and advise him accordingly. 4. Mixed dyslipidemia: Diet was emphasized.  He has done well with this.  We will keep a track of his lipids in the future. 5. Pericardial effusion: This is resolved and I discussed echocardiogram findings with him at length.  Details are mentioned below. 6. Sleep apnea: Sleep health issues were discussed. 7. Permanent pacemaker: Managed by our electrophysiology colleagues. 8. Patient will be seen in follow-up appointment in 4 months or earlier if the patient has any concerns    Medication Adjustments/Labs and Tests Ordered: Current medicines are reviewed at length with the patient today.  Concerns regarding medicines are outlined  above.  No orders of the defined types were placed in this encounter.  No orders of the defined types were placed in this encounter.    No chief complaint on file.    History of Present Illness:    Ricky Hood is a 75 y.o. male.  Patient has past medical history of A-V conduction disturbances and permanent pacemaker, paroxysmal atrial fibrillation, sleep apnea and ascending aortic aneurysm.  He denies any problems at this time and takes care of activities of daily living.  His systolic function is preserved.  He was in congestive heart failure for unclear reasons.  This was treated and subsequently resolved.  He also has history of pericardial effusion and this is resolved.  He denies any problems at this time and takes care of activities of daily living.  No chest pain orthopnea or PND.  He walks on a daily basis.  Past Medical History:  Diagnosis Date  . Acute diastolic heart failure (HCC) 11/15/2020  . Aneurysm of ascending aorta (HCC) 11/24/2020  . Complete heart block (HCC) 09/16/2020  . Dilated aortic root (HCC) 11/24/2020  . Hypercholesteremia   . Hyperlipidemia 11/24/2020  . 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 Hypertension   . Hyponatremia 11/24/2020  . Obstructive sleep apnea (adult) (pediatric) 08/10/2014  . Paroxysmal atrial fibrillation (HCC)   . Pericardial effusion 11/24/2020  . Pleural effusion   . 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 Second degree AV block   . Snoring 08/10/2014  . St Jude Medical Assurity MRI conditional  dual-chamber pacemaker for symptomatic complete heart block  09/24/2020    Past Surgical History:  Procedure Laterality Date  . BIOPSY PROSTATE    . COLONOSCOPY    . PACEMAKER IMPLANT N/A 09/17/2020   SJM Assurity DR implanted by Dr Johney Frame for AV block and syncope    Current Medications: Current Meds  Medication Sig  . acetaminophen (TYLENOL) 325 MG tablet Take 325-650 mg by mouth daily as needed for headache (pain).  Marland Kitchen  ascorbic acid (VITAMIN C) 500 MG tablet Take 500 mg by mouth daily after supper.  . Cholecalciferol (VITAMIN D3) 50 MCG (2000 UT) TABS Take 2,000 Units by mouth daily after supper.  . Coenzyme Q10 (COQ10) 50 MG CAPS Take 50 mg by mouth daily.  . finasteride (PROSCAR) 5 MG tablet Take 5 mg by mouth daily after supper.  . fish oil-omega-3 fatty acids 1000 MG capsule Take 3 g by mouth daily after supper.  . furosemide (LASIX) 40 MG tablet TAKE 1 TABLET BY BY MOUTH DAILY AS NEEDED FOR WEIGHT GAIN OF 3 POUNS IN 1 DAY OR 5 POUNDS IN ONE WEEK OR WORSENING LOWER LEG EDEMA  . ibuprofen (ADVIL) 600 MG tablet Take 1 tablet (600 mg total) by mouth 3 (three) times daily.  Marland Kitchen lisinopril (ZESTRIL) 10 MG tablet Take 10 mg by mouth daily after supper.  . loratadine (CLARITIN) 10 MG tablet Take 10 mg by mouth daily after supper.  . Multiple Vitamin (MULTIVITAMIN WITH MINERALS) TABS tablet Take 1 tablet by mouth daily after supper.  . Pantoprazole Sodium (PROTONIX PO) Take 100 mg by mouth daily in the afternoon.  . Psyllium (METAMUCIL FIBER PO) Take by mouth See admin instructions. Mix 1 heaping teaspoonful of powder into 6-8 ounces of juice and drink in the morning and before supper  . rivaroxaban (XARELTO) 20 MG TABS tablet Take 1 tablet (20 mg total) by mouth daily with supper.  . rosuvastatin (CRESTOR) 5 MG tablet Take 5 mg by mouth daily after supper.  Marland Kitchen TART CHERRY PO Take 30 mLs by mouth 2 (two) times daily with a meal.     Allergies:   Patient has no known allergies.   Social History   Socioeconomic History  . Marital status: Married    Spouse name: Not on file  . Number of children: 3  . Years of education: Not on file  . Highest education level: Not on file  Occupational History  . Not on file  Tobacco Use  . Smoking status: Never Smoker  . Smokeless tobacco: Never Used  Vaping Use  . Vaping Use: Never used  Substance and Sexual Activity  . Alcohol use: Never  . Drug use: Never  . Sexual  activity: Not on file  Other Topics Concern  . Not on file  Social History Narrative  . Not on file   Social Determinants of Health   Financial Resource Strain: Not on file  Food Insecurity: Not on file  Transportation Needs: Not on file  Physical Activity: Not on file  Stress: Not on file  Social Connections: Not on file     Family History: The patient's family history includes Hyperlipidemia in his father and mother; Hypertension in his father and mother; Other in his mother.  ROS:   Please see the history of present illness.    All other systems reviewed and are negative.  EKGs/Labs/Other Studies Reviewed:    The following studies were reviewed today: IMPRESSIONS    1. Left ventricular ejection fraction, by estimation, is 50 to 55%. The  left ventricle has low normal function. The left ventricle has no regional  wall motion abnormalities. There is severe concentric left ventricular  hypertrophy. Left ventricular  diastolic parameters are consistent with Grade II diastolic dysfunction  (pseudonormalization).  2. Right ventricular systolic function is normal. The right ventricular  size is normal.  3. The mitral valve is normal in structure. No evidence of mitral valve  regurgitation. No evidence of mitral stenosis.  4. Tricuspid valve regurgitation is moderate.  5. The aortic valve is normal in structure. Aortic valve regurgitation is  mild. Mild aortic valve sclerosis is present, with no evidence of aortic  valve stenosis.  6. Aortic dilatation noted. Aneurysm of the ascending aorta, measuring 44  mm. There is mild dilatation of the aortic root, measuring 38 mm.  7. The inferior vena cava is normal in size with greater than 50%  respiratory variability, suggesting right atrial pressure of 3 mmHg.    Recent Labs: 11/16/2020: B Natriuretic Peptide 481.4 11/21/2020: TSH 2.956 11/24/2020: Hemoglobin 12.7; Magnesium 2.0; Platelets 442 11/28/2020: ALT  32 12/25/2020: BUN 22; Creatinine, Ser 1.11; Potassium 4.1; Sodium 145  Recent Lipid Panel No results found for: CHOL, TRIG, HDL, CHOLHDL, VLDL, LDLCALC, LDLDIRECT  Physical Exam:    VS:  BP (!) 154/86   Pulse 76   Ht 5\' 7"  (1.702 m)   Wt 163 lb 6.4 oz (74.1 kg)   SpO2 98%   BMI 25.59 kg/m     Wt Readings from Last 3 Encounters:  01/10/21 163 lb 6.4 oz (74.1 kg)  12/09/20 165 lb 12.8 oz (75.2 kg)  11/23/20 164 lb 14.5 oz (74.8 kg)     GEN: Patient is in no acute distress HEENT: Normal NECK: No JVD; No carotid bruits LYMPHATICS: No lymphadenopathy CARDIAC: Hear sounds regular, 2/6 systolic murmur at the apex. RESPIRATORY:  Clear to auscultation without rales, wheezing or rhonchi  ABDOMEN: Soft, non-tender, non-distended MUSCULOSKELETAL:  No edema; No deformity  SKIN: Warm and dry NEUROLOGIC:  Alert and oriented x 3 PSYCHIATRIC:  Normal affect   Signed, 11/25/20, MD  01/10/2021 2:02 PM    [facility]

## 2021-01-10 NOTE — Patient Instructions (Signed)

## 2021-01-17 ENCOUNTER — Ambulatory Visit: Payer: Medicare Other | Admitting: Internal Medicine

## 2021-01-17 ENCOUNTER — Encounter: Payer: Self-pay | Admitting: Internal Medicine

## 2021-01-17 ENCOUNTER — Other Ambulatory Visit: Payer: Self-pay

## 2021-01-17 VITALS — BP 144/72 | HR 71 | Ht 67.0 in | Wt 164.5 lb

## 2021-01-17 DIAGNOSIS — I10 Essential (primary) hypertension: Secondary | ICD-10-CM | POA: Diagnosis not present

## 2021-01-17 DIAGNOSIS — I442 Atrioventricular block, complete: Secondary | ICD-10-CM | POA: Diagnosis not present

## 2021-01-17 DIAGNOSIS — I480 Paroxysmal atrial fibrillation: Secondary | ICD-10-CM | POA: Diagnosis not present

## 2021-01-17 NOTE — Progress Notes (Signed)
PCP: Marlyn Corporal, PA Primary Cardiologist: Dr Tomie China Primary EP:  Dr Samuel Bouche [place] is a 75 y.o. male who presents today for routine electrophysiology followup.  Since last being seen in our clinic, the patient reports doing very well.  Today, he denies symptoms of palpitations, chest pain, shortness of breath,  lower extremity edema, dizziness, presyncope, or syncope.  The patient is otherwise without complaint today.   Past Medical History:  Diagnosis Date  . Acute diastolic heart failure (HCC) 11/15/2020  . Aneurysm of ascending aorta (HCC) 11/24/2020  . Complete heart block (HCC) 09/16/2020  . Dilated aortic root (HCC) 11/24/2020  . Hypercholesteremia   . Hyperlipidemia 11/24/2020  . 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 Hypertension   . Hyponatremia 11/24/2020  . Obstructive sleep apnea (adult) (pediatric) 08/10/2014  . Paroxysmal atrial fibrillation (HCC)   . Pericardial effusion 11/24/2020  . Pleural effusion   . 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 Second degree AV block   . Snoring 08/10/2014  . St Jude Medical Assurity MRI conditional  dual-chamber pacemaker for symptomatic complete heart block   09/24/2020   Past Surgical History:  Procedure Laterality Date  . BIOPSY PROSTATE    . COLONOSCOPY    . PACEMAKER IMPLANT N/A 09/17/2020   SJM Assurity DR implanted by Dr Johney Frame for AV block and syncope    ROS- all systems are reviewed and negative except as per HPI above  Current Outpatient Medications  Medication Sig Dispense Refill  . acetaminophen (TYLENOL) 325 MG tablet Take 325-650 mg by mouth daily as needed for headache (pain).    Marland Kitchen ascorbic acid (VITAMIN C) 500 MG tablet Take 500 mg by mouth daily after supper.    . Cholecalciferol (VITAMIN D3) 50 MCG (2000 UT) TABS Take 2,000 Units by mouth daily after supper.    . Coenzyme Q10 (COQ10) 50 MG CAPS Take 50 mg by mouth daily.    . finasteride (PROSCAR) 5 MG tablet Take 5 mg by mouth daily after  supper.    . fish oil-omega-3 fatty acids 1000 MG capsule Take 3 g by mouth daily after supper.    . furosemide (LASIX) 40 MG tablet TAKE 1 TABLET BY BY MOUTH DAILY AS NEEDED FOR WEIGHT GAIN OF 3 POUNS IN 1 DAY OR 5 POUNDS IN ONE WEEK OR WORSENING LOWER LEG EDEMA 90 tablet 2  . ibuprofen (ADVIL) 600 MG tablet Take 1 tablet (600 mg total) by mouth 3 (three) times daily. 30 tablet 0  . lisinopril (ZESTRIL) 10 MG tablet Take 10 mg by mouth daily after supper.    . loratadine (CLARITIN) 10 MG tablet Take 10 mg by mouth daily after supper.    . Multiple Vitamin (MULTIVITAMIN WITH MINERALS) TABS tablet Take 1 tablet by mouth daily after supper.    . Pantoprazole Sodium (PROTONIX PO) Take 100 mg by mouth daily in the afternoon.    . Psyllium (METAMUCIL FIBER PO) Take by mouth See admin instructions. Mix 1 heaping teaspoonful of powder into 6-8 ounces of juice and drink in the morning and before supper    . rivaroxaban (XARELTO) 20 MG TABS tablet Take 1 tablet (20 mg total) by mouth daily with supper. 90 tablet 3  . rosuvastatin (CRESTOR) 5 MG tablet Take 5 mg by mouth daily after supper.    Marland Kitchen TART CHERRY PO Take 30 mLs by mouth 2 (two) times daily with a meal.     No current facility-administered medications for this  visit.    Physical Exam: Vitals:   01/17/21 1430  BP: (!) 144/72  Pulse: 71  SpO2: 97%  Weight: 164 lb 8 oz (74.6 kg)  Height: 5\' 7"  (1.702 m)    GEN- The patient is well appearing, alert and oriented x 3 today.   Head- normocephalic, atraumatic Eyes-  Sclera clear, conjunctiva pink Ears- hearing intact Oropharynx- clear Lungs-   normal work of breathing Chest- pacemaker pocket is well healed Heart- Regular rate and rhythm  GI- soft,  Extremities- no clubbing, cyanosis, or edema  Pacemaker interrogation- reviewed in detail today,  See PACEART report  ekg tracing ordered today is personally reviewed and shows sinus with PACs, V paced  Assessment and Plan:  1.  Symptomatic complete heart block Normal pacemaker function See Art report No changes today he is device dependant today  2. afib Occurred in the setting of acute decompensated diastolic dysfunction in December.  No afib since. Could potentially stop xarelto if he has not afib over the next year.  3. HTN Stable No change required today  4. Chronic diastolic dysfunction Stable No change required today   Risks, benefits and potential toxicities for medications prescribed and/or refilled reviewed with patient today.   Follow-up with Dr January in [place] in a year  [place] MD, [facility] 01/17/2021 2:36 PM

## 2021-01-17 NOTE — Patient Instructions (Signed)
Medication Instructions:  Your physician recommends that you continue on your current medications as directed. Please refer to the Current Medication list given to you today.  Labwork: None ordered.  Testing/Procedures: None ordered.  Follow-Up: Your physician wants you to follow-up in: one year with Dr. Elberta Fortis in [place]   Any Other Special Instructions Will Be Listed Below (If Applicable).  If you need a refill on your cardiac medications before your next appointment, please call your pharmacy.

## 2021-01-21 ENCOUNTER — Telehealth: Payer: Self-pay

## 2021-01-21 NOTE — Telephone Encounter (Signed)
Results of [facility] chest CT reviewed with pt as per Dr. Kem Parkinson note.  Pt verbalized understanding and had no additional questions. Results were faxed to Graylon Gunning, PA. Her office is aware as well. Routed to PCP.

## 2021-03-19 ENCOUNTER — Ambulatory Visit (INDEPENDENT_AMBULATORY_CARE_PROVIDER_SITE_OTHER): Payer: Medicare Other

## 2021-03-19 DIAGNOSIS — I441 Atrioventricular block, second degree: Secondary | ICD-10-CM

## 2021-03-19 LAB — CUP PACEART REMOTE DEVICE CHECK
Battery Remaining Longevity: 111 mo
Battery Remaining Percentage: 95.5 %
Battery Voltage: 3.02 V
Brady Statistic AP VP Percent: 45 %
Brady Statistic AP VS Percent: 1 %
Brady Statistic AS VP Percent: 54 %
Brady Statistic AS VS Percent: 1 %
Brady Statistic RA Percent Paced: 44 %
Brady Statistic RV Percent Paced: 99 %
Date Time Interrogation Session: 20220420050103
Implantable Lead Implant Date: 20211019
Implantable Lead Implant Date: 20211019
Implantable Lead Location: 753859
Implantable Lead Location: 753860
Implantable Pulse Generator Implant Date: 20211019
Lead Channel Impedance Value: 410 Ohm
Lead Channel Impedance Value: 490 Ohm
Lead Channel Pacing Threshold Amplitude: 0.5 V
Lead Channel Pacing Threshold Amplitude: 1 V
Lead Channel Pacing Threshold Pulse Width: 0.5 ms
Lead Channel Pacing Threshold Pulse Width: 0.5 ms
Lead Channel Sensing Intrinsic Amplitude: 4.3 mV
Lead Channel Sensing Intrinsic Amplitude: 5 mV
Lead Channel Setting Pacing Amplitude: 1.25 V
Lead Channel Setting Pacing Amplitude: 1.5 V
Lead Channel Setting Pacing Pulse Width: 0.5 ms
Lead Channel Setting Sensing Sensitivity: 4 mV
Pulse Gen Model: 2272
Pulse Gen Serial Number: 6220383

## 2021-04-04 DIAGNOSIS — I898 Other specified noninfective disorders of lymphatic vessels and lymph nodes: Secondary | ICD-10-CM | POA: Diagnosis not present

## 2021-04-04 DIAGNOSIS — K753 Granulomatous hepatitis, not elsewhere classified: Secondary | ICD-10-CM | POA: Diagnosis not present

## 2021-04-04 DIAGNOSIS — I2510 Atherosclerotic heart disease of native coronary artery without angina pectoris: Secondary | ICD-10-CM | POA: Diagnosis not present

## 2021-04-04 DIAGNOSIS — R918 Other nonspecific abnormal finding of lung field: Secondary | ICD-10-CM | POA: Diagnosis not present

## 2021-04-04 DIAGNOSIS — I712 Thoracic aortic aneurysm, without rupture: Secondary | ICD-10-CM | POA: Diagnosis not present

## 2021-04-04 NOTE — Progress Notes (Signed)
Remote pacemaker transmission.   

## 2021-04-09 DIAGNOSIS — H25813 Combined forms of age-related cataract, bilateral: Secondary | ICD-10-CM | POA: Diagnosis not present

## 2021-04-16 ENCOUNTER — Encounter: Payer: Self-pay | Admitting: Physician Assistant

## 2021-04-16 ENCOUNTER — Other Ambulatory Visit: Payer: Self-pay

## 2021-04-16 ENCOUNTER — Telehealth: Payer: Self-pay

## 2021-04-16 DIAGNOSIS — I712 Thoracic aortic aneurysm, without rupture, unspecified: Secondary | ICD-10-CM

## 2021-04-16 DIAGNOSIS — I7121 Aneurysm of the ascending aorta, without rupture: Secondary | ICD-10-CM

## 2021-04-16 NOTE — Telephone Encounter (Signed)
RH chest CT aneurysm 4.5 cm needs recheck 6 months 04/04/21. Will

## 2021-04-30 DIAGNOSIS — H348312 Tributary (branch) retinal vein occlusion, right eye, stable: Secondary | ICD-10-CM | POA: Diagnosis not present

## 2021-05-16 ENCOUNTER — Encounter: Payer: Self-pay | Admitting: Cardiology

## 2021-05-16 ENCOUNTER — Other Ambulatory Visit: Payer: Self-pay

## 2021-05-16 ENCOUNTER — Ambulatory Visit: Payer: Medicare Other | Admitting: Cardiology

## 2021-05-16 ENCOUNTER — Telehealth: Payer: Self-pay | Admitting: Cardiology

## 2021-05-16 VITALS — BP 132/76 | HR 72 | Ht 67.0 in | Wt 164.6 lb

## 2021-05-16 DIAGNOSIS — Z950 Presence of cardiac pacemaker: Secondary | ICD-10-CM | POA: Diagnosis not present

## 2021-05-16 DIAGNOSIS — E782 Mixed hyperlipidemia: Secondary | ICD-10-CM | POA: Diagnosis not present

## 2021-05-16 DIAGNOSIS — Z125 Encounter for screening for malignant neoplasm of prostate: Secondary | ICD-10-CM

## 2021-05-16 DIAGNOSIS — I480 Paroxysmal atrial fibrillation: Secondary | ICD-10-CM

## 2021-05-16 DIAGNOSIS — I712 Thoracic aortic aneurysm, without rupture: Secondary | ICD-10-CM | POA: Diagnosis not present

## 2021-05-16 DIAGNOSIS — I7121 Aneurysm of the ascending aorta, without rupture: Secondary | ICD-10-CM

## 2021-05-16 NOTE — Patient Instructions (Signed)
Medication Instructions:  No medication changes. *If you need a refill on your cardiac medications before your next appointment, please call your pharmacy*   Lab Work: Your physician recommends that you return for lab work in: 1 month You need to have labs done when you are fasting.  You can come Monday through Friday 8:30 am to 12:00 pm and 1:15 to 4:30. You do not need to make an appointment as the order has already been placed. The labs you are going to have done are BMET, CBC, TSH, LFT and Lipids.  If you have labs (blood work) drawn today and your tests are completely normal, you will receive your results only by: MyChart Message (if you have MyChart) OR A paper copy in the mail If you have any lab test that is abnormal or we need to change your treatment, we will call you to review the results.   Testing/Procedures: None ordered   Follow-Up: At [facility], you and your health needs are our priority.  As part of our continuing mission to provide you with exceptional heart care, we have created designated Provider Care Teams.  These Care Teams include your primary Cardiologist (physician) and Advanced Practice Providers (APPs -  Physician Assistants and Nurse Practitioners) who all work together to provide you with the care you need, when you need it.  We recommend signing up for the patient portal called "MyChart".  Sign up information is provided on this After Visit Summary.  MyChart is used to connect with patients for Virtual Visits (Telemedicine).  Patients are able to view lab/test results, encounter notes, upcoming appointments, etc.  Non-urgent messages can be sent to your provider as well.   To learn more about what you can do with MyChart, go to [email or web address].    Your next appointment:   6 month(s)  The format for your next appointment:   In Person  Provider:   Belva Crome, MD   Other Instructions NA

## 2021-05-16 NOTE — Telephone Encounter (Signed)
PT is calling to confirm if it is ok to use his blood work from his pcp the physical he is getting done

## 2021-05-16 NOTE — Telephone Encounter (Signed)
Per DPR detailed message left on VM that labs can be done with PCP and have them fax results.

## 2021-05-16 NOTE — Progress Notes (Signed)
Cardiology Office Note:    Date:  05/16/2021   ID:  Ricky Hood, DOB [birth year], MRN [id number]  PCP:  Ricky Corporal, PA  Cardiologist:  Ricky Brothers, MD   Referring MD: Ricky Corporal, PA    ASSESSMENT:    1. Aneurysm of ascending aorta (HCC)   2. Mixed hyperlipidemia   3. Paroxysmal atrial fibrillation (HCC)   4. Presence of permanent cardiac pacemaker    PLAN:    In order of problems listed above:  Primary prevention stressed with the patient.  Importance of compliance with diet medication stressed and she vocalized understanding.  He walks on a regular basis. 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 Ascending aortic aneurysm: I reviewed CT scan report with the patient at extensive length.  Questions were answered to his satisfaction.  His wife accompanies him for this visit.  We have given him an appointment to get established with cardiovascular surgery. Essential hypertension: Blood pressure stable.  Patient has multiple blood pressure readings from home and they are excellent. Mixed dyslipidemia: Lipids were reviewed.  She will be back in a month for complete blood work including fasting lipids.  Diet emphasized. Patient will be seen in follow-up appointment in 6 months or earlier if the patient has any concerns    Medication Adjustments/Labs and Tests Ordered: Current medicines are reviewed at length with the patient today.  Concerns regarding medicines are outlined above.  No orders of the defined types were placed in this encounter.  No orders of the defined types were placed in this encounter.    No chief complaint on file.    History of Present Illness:    Ricky Hood is a 75 y.o. male.  Patient has past medical history of high degree AV block and permanent pacemaking, essential hypertension dyslipidemia.  He denies any problems at this time and takes care of activities of daily living.  No  chest pain orthopnea or presently.  He exercises on a regular basis.  At the time of my evaluation, the patient is alert awake oriented and in no distress.  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/10/2014   Hypertension    Hyponatremia 11/24/2020   Obstructive sleep apnea (adult) (pediatric) 08/10/2014   Paroxysmal atrial fibrillation (HCC)    Pericardial effusion 11/24/2020   Pleural effusion    Presence of permanent cardiac pacemaker 09/17/2020   Second degree AV block    Snoring 08/10/2014   St Jude Medical Assurity MRI conditional  dual-chamber pacemaker for symptomatic complete heart block   09/24/2020    Past Surgical History:  Procedure Laterality Date   BIOPSY PROSTATE     COLONOSCOPY     PACEMAKER IMPLANT N/A 09/17/2020   SJM Assurity DR implanted by Dr Ricky Hood for AV block and syncope    Current Medications: Current Meds  Medication Sig   acetaminophen (TYLENOL) 325 MG tablet Take 325-650 mg by mouth daily as needed for headache (pain).   ascorbic acid (VITAMIN C) 500 MG tablet Take 500 mg by mouth daily after supper.   Cholecalciferol (VITAMIN D3) 50 MCG (2000 UT) TABS Take 2,000 Units by mouth daily after supper.   Coenzyme Q10 (COQ10) 50 MG CAPS Take 50  mg by mouth daily.   finasteride (PROSCAR) 5 MG tablet Take 5 mg by mouth daily after supper.   fish oil-omega-3 fatty acids 1000 MG capsule Take 3 g by mouth daily after supper.   furosemide (LASIX) 40 MG tablet Take 40 mg by mouth as needed for edema. Or weight gain of 3 pounds in 1 day or 5 pounds in 1 week   ibuprofen (ADVIL) 600 MG tablet Take 1 tablet (600 mg total) by mouth 3 (three) times daily.   lisinopril (ZESTRIL) 10 MG tablet Take 10 mg by mouth daily after supper.    loratadine (CLARITIN) 10 MG tablet Take 10 mg by mouth daily after supper.   Multiple Vitamin (MULTIVITAMIN WITH MINERALS) TABS tablet Take 1 tablet by mouth daily after supper.   pantoprazole (PROTONIX) 40 MG tablet Take 1 tablet by mouth daily.   rivaroxaban (XARELTO) 20 MG TABS tablet Take 1 tablet (20 mg total) by mouth daily with supper.   rosuvastatin (CRESTOR) 5 MG tablet Take 5 mg by mouth daily after supper.   TART CHERRY PO Take 30 mLs by mouth 2 (two) times daily with a meal.     Allergies:   Patient has no known allergies.   Social History   Socioeconomic History   Marital status: Married    Spouse name: Not on file   Number of children: 3   Years of education: Not on file   Highest education level: Not on file  Occupational History   Not on file  Tobacco Use   Smoking status: Never   Smokeless tobacco: Never  Vaping Use   Vaping Use: Never used  Substance and Sexual Activity   Alcohol use: Never   Drug use: Never   Sexual activity: Not on file  Other Topics Concern   Not on file  Social History Narrative   Not on file   Social Determinants of Health   Financial Resource Strain: Not on file  Food Insecurity: Not on file  Transportation Needs: Not on file  Physical Activity: Not on file  Stress: Not on file  Social Connections: Not on file     Family History: The patient's family history includes Hyperlipidemia in his father and mother; Hypertension in his father and mother; Other in his mother.  ROS:   Please see the history of present illness.    All other systems reviewed and are negative.  EKGs/Labs/Other Studies Reviewed:    The following studies were reviewed today: I discussed my findings with the patient at length.   Recent Labs: 11/16/2020: B Natriuretic Peptide 481.4 11/21/2020: TSH 2.956 11/24/2020: Hemoglobin 12.7; Magnesium 2.0; Platelets 442 11/28/2020: ALT 32 12/25/2020: BUN 22; Creatinine, Ser 1.11; Potassium 4.1; Sodium 145   Recent Lipid Panel No results found for: CHOL, TRIG, HDL, CHOLHDL, VLDL, LDLCALC, LDLDIRECT  Physical Exam:    VS:  BP 132/76   Pulse 72   Ht 5\' 7"  (1.702 m)   Wt 164 lb 9.6 oz (74.7 kg)   SpO2 96%   BMI 25.78 kg/m     Wt Readings from Last 3 Encounters:  05/16/21 164 lb 9.6 oz (74.7 kg)  01/17/21 164 lb 8 oz (74.6 kg)  01/10/21 163 lb 6.4 oz (74.1 kg)     GEN: Patient is in no acute distress HEENT: Normal NECK: No JVD; No carotid bruits LYMPHATICS: No lymphadenopathy CARDIAC: Hear sounds regular, 2/6 systolic murmur at the apex. RESPIRATORY:  Clear to auscultation without rales, wheezing or rhonchi  ABDOMEN: Soft, non-tender, non-distended MUSCULOSKELETAL:  No edema; No deformity  SKIN: Warm and dry NEUROLOGIC:  Alert and oriented x 3 PSYCHIATRIC:  Normal affect   Signed, Ricky Brothers, MD  05/16/2021 2:10 PM    [facility]

## 2021-05-22 ENCOUNTER — Other Ambulatory Visit: Payer: Self-pay

## 2021-05-22 ENCOUNTER — Institutional Professional Consult (permissible substitution): Payer: Medicare Other | Admitting: Cardiothoracic Surgery

## 2021-05-22 ENCOUNTER — Encounter: Payer: Self-pay | Admitting: Cardiothoracic Surgery

## 2021-05-22 VITALS — BP 165/84 | HR 80 | Resp 20 | Ht 67.0 in | Wt 169.0 lb

## 2021-05-22 DIAGNOSIS — I712 Thoracic aortic aneurysm, without rupture: Secondary | ICD-10-CM

## 2021-05-22 DIAGNOSIS — I7121 Aneurysm of the ascending aorta, without rupture: Secondary | ICD-10-CM

## 2021-06-18 ENCOUNTER — Ambulatory Visit (INDEPENDENT_AMBULATORY_CARE_PROVIDER_SITE_OTHER): Payer: Medicare Other

## 2021-06-18 DIAGNOSIS — E782 Mixed hyperlipidemia: Secondary | ICD-10-CM | POA: Diagnosis not present

## 2021-06-18 DIAGNOSIS — I441 Atrioventricular block, second degree: Secondary | ICD-10-CM | POA: Diagnosis not present

## 2021-06-18 DIAGNOSIS — I480 Paroxysmal atrial fibrillation: Secondary | ICD-10-CM | POA: Diagnosis not present

## 2021-06-18 LAB — CUP PACEART REMOTE DEVICE CHECK
Battery Remaining Longevity: 104 mo
Battery Remaining Percentage: 91 %
Battery Voltage: 3.02 V
Brady Statistic AP VP Percent: 47 %
Brady Statistic AP VS Percent: 1.3 %
Brady Statistic AS VP Percent: 48 %
Brady Statistic AS VS Percent: 3.1 %
Brady Statistic RA Percent Paced: 47 %
Brady Statistic RV Percent Paced: 95 %
Date Time Interrogation Session: 20220720040015
Implantable Lead Implant Date: 20211019
Implantable Lead Implant Date: 20211019
Implantable Lead Location: 753859
Implantable Lead Location: 753860
Implantable Pulse Generator Implant Date: 20211019
Lead Channel Impedance Value: 410 Ohm
Lead Channel Impedance Value: 490 Ohm
Lead Channel Pacing Threshold Amplitude: 0.625 V
Lead Channel Pacing Threshold Amplitude: 1.125 V
Lead Channel Pacing Threshold Pulse Width: 0.5 ms
Lead Channel Pacing Threshold Pulse Width: 0.5 ms
Lead Channel Sensing Intrinsic Amplitude: 5 mV
Lead Channel Sensing Intrinsic Amplitude: 6.5 mV
Lead Channel Setting Pacing Amplitude: 1.375
Lead Channel Setting Pacing Amplitude: 1.625
Lead Channel Setting Pacing Pulse Width: 0.5 ms
Lead Channel Setting Sensing Sensitivity: 4 mV
Pulse Gen Model: 2272
Pulse Gen Serial Number: 6220383

## 2021-06-18 NOTE — Addendum Note (Signed)
Addended by: Eleonore Chiquito on: 06/18/2021 09:51 AM   Modules accepted: Orders

## 2021-06-19 LAB — LIPID PANEL
Chol/HDL Ratio: 2 ratio (ref 0.0–5.0)
Cholesterol, Total: 117 mg/dL (ref 100–199)
HDL: 58 mg/dL (ref >39)
LDL Chol Calc (NIH): 49 mg/dL (ref 0–99)
Triglycerides: 35 mg/dL (ref 0–149)
VLDL Cholesterol Cal: 10 mg/dL (ref 5–40)

## 2021-06-19 LAB — HEPATIC FUNCTION PANEL
ALT: 19 IU/L (ref 0–44)
AST: 17 IU/L (ref 0–40)
Albumin: 4.1 g/dL (ref 3.7–4.7)
Alkaline Phosphatase: 59 IU/L (ref 44–121)
Bilirubin Total: 0.4 mg/dL (ref 0.0–1.2)
Bilirubin, Direct: 0.17 mg/dL (ref 0.00–0.40)
Total Protein: 6.2 g/dL (ref 6.0–8.5)

## 2021-06-19 LAB — CBC WITH DIFFERENTIAL/PLATELET
Basophils Absolute: 0 10*3/uL (ref 0.0–0.2)
Basos: 0 %
EOS (ABSOLUTE): 0.1 10*3/uL (ref 0.0–0.4)
Eos: 1 %
Hematocrit: 38.2 % (ref 37.5–51.0)
Hemoglobin: 12.7 g/dL — ABNORMAL LOW (ref 13.0–17.7)
Immature Grans (Abs): 0 10*3/uL (ref 0.0–0.1)
Immature Granulocytes: 0 %
Lymphocytes Absolute: 1.6 10*3/uL (ref 0.7–3.1)
Lymphs: 30 %
MCH: 30.9 pg (ref 26.6–33.0)
MCHC: 33.2 g/dL (ref 31.5–35.7)
MCV: 93 fL (ref 79–97)
Monocytes Absolute: 0.5 10*3/uL (ref 0.1–0.9)
Monocytes: 9 %
Neutrophils Absolute: 3.3 10*3/uL (ref 1.4–7.0)
Neutrophils: 60 %
Platelets: 182 10*3/uL (ref 150–450)
RBC: 4.11 x10E6/uL — ABNORMAL LOW (ref 4.14–5.80)
RDW: 12.7 % (ref 11.6–15.4)
WBC: 5.5 10*3/uL (ref 3.4–10.8)

## 2021-06-19 LAB — BASIC METABOLIC PANEL
BUN/Creatinine Ratio: 18 (ref 10–24)
BUN: 21 mg/dL (ref 8–27)
CO2: 26 mmol/L (ref 20–29)
Calcium: 8.8 mg/dL (ref 8.6–10.2)
Chloride: 101 mmol/L (ref 96–106)
Creatinine, Ser: 1.15 mg/dL (ref 0.76–1.27)
Glucose: 94 mg/dL (ref 65–99)
Potassium: 4.5 mmol/L (ref 3.5–5.2)
Sodium: 136 mmol/L (ref 134–144)
eGFR: 66 mL/min/{1.73_m2} (ref >59)

## 2021-06-19 LAB — TSH: TSH: 4.61 u[IU]/mL — ABNORMAL HIGH (ref 0.450–4.500)

## 2021-06-26 DIAGNOSIS — G4733 Obstructive sleep apnea (adult) (pediatric): Secondary | ICD-10-CM | POA: Diagnosis not present

## 2021-06-27 DIAGNOSIS — G4733 Obstructive sleep apnea (adult) (pediatric): Secondary | ICD-10-CM | POA: Diagnosis not present

## 2021-07-03 NOTE — Progress Notes (Signed)
[address]       [place] [address]             [phone]     CARDIOTHORACIC SURGERY office visitation  Referring Provider is Revankar, Aundra Dubin, MD Primary Cardiologist is Garwin Brothers, MD PCP is Marlyn Corporal, PA  Chief Complaint  Patient presents with   Thoracic Aortic Aneurysm    New patient consultation, TAA, ECHO 01/06/21, Chest CT 04/04/21    HPI:  75 year old man with cardiac history notable for paroxysmal atrial fibrillation and heart block status post pacemaker insertion underwent echocardiogram this past winter which showed relatively normal LV function and dilated ascending aorta.  This was followed with a CT scan which confirmed aortic aneurysm measuring at maximal diameter 4.5 cm.  He has no family history of personal history of aneurysms.  He is treated with medication for hypertension.  He is also on Xarelto for atrial fibrillation.  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/10/2014   Hypertension    Hyponatremia 11/24/2020   Obstructive sleep apnea (adult) (pediatric) 08/10/2014   Paroxysmal atrial fibrillation (HCC)    Pericardial effusion 11/24/2020   Pleural effusion    Presence of permanent cardiac pacemaker 09/17/2020   Second degree AV block    Snoring 08/10/2014   St Jude Medical Assurity MRI conditional  dual-chamber pacemaker for symptomatic complete heart block   09/24/2020    Past Surgical History:  Procedure Laterality Date   BIOPSY PROSTATE     COLONOSCOPY     PACEMAKER IMPLANT N/A 09/17/2020   SJM Assurity DR implanted by Dr Johney Frame for AV block and syncope    Family History  Problem Relation Age of Onset   Hypertension Mother    Hyperlipidemia Mother     Other Mother        Carotid endarterectomy   Hypertension Father    Hyperlipidemia Father     Social History   Socioeconomic History   Marital status: Married    Spouse name: Not on file   Number of children: 3   Years of education: Not on file   Highest education level: Not on file  Occupational History   Not on file  Tobacco Use   Smoking status: Never   Smokeless tobacco: Never  Vaping Use   Vaping Use: Never used  Substance and Sexual Activity   Alcohol use: Never   Drug use: Never   Sexual activity: Not on file  Other Topics Concern   Not on file  Social History Narrative   Not on file   Social Determinants of Health   Financial Resource Strain: Not on file  Food Insecurity: Not on file  Transportation Needs: Not on file  Physical Activity: Not on file  Stress: Not on file  Social Connections: Not on file  Intimate Partner Violence: Not on file    Current Outpatient Medications  Medication Sig Dispense Refill   acetaminophen (TYLENOL) 325 MG tablet Take 325-650 mg by mouth daily as needed for headache (pain).     ascorbic acid (VITAMIN C) 500 MG tablet Take 500 mg by mouth daily after supper.     Cholecalciferol (VITAMIN D3) 50 MCG (2000  UT) TABS Take 2,000 Units by mouth daily after supper.     Coenzyme Q10 (COQ10) 50 MG CAPS Take 50 mg by mouth daily.     finasteride (PROSCAR) 5 MG tablet Take 5 mg by mouth daily after supper.     fish oil-omega-3 fatty acids 1000 MG capsule Take 3 g by mouth daily after supper.     furosemide (LASIX) 40 MG tablet Take 40 mg by mouth as needed for edema. Or weight gain of 3 pounds in 1 day or 5 pounds in 1 week     ibuprofen (ADVIL) 600 MG tablet Take 1 tablet (600 mg total) by mouth 3 (three) times daily. 30 tablet 0   lisinopril (ZESTRIL) 10 MG tablet Take 10 mg by mouth daily after supper.     loratadine (CLARITIN) 10 MG tablet Take 10 mg by mouth daily after supper.     Multiple Vitamin (MULTIVITAMIN WITH MINERALS)  TABS tablet Take 1 tablet by mouth daily after supper.     pantoprazole (PROTONIX) 40 MG tablet Take 1 tablet by mouth daily.     rivaroxaban (XARELTO) 20 MG TABS tablet Take 1 tablet (20 mg total) by mouth daily with supper. 90 tablet 3   rosuvastatin (CRESTOR) 5 MG tablet Take 5 mg by mouth daily after supper.     TART CHERRY PO Take 30 mLs by mouth 2 (two) times daily with a meal.     No current facility-administered medications for this visit.    No Known Allergies    Review of Systems:   General:  No change in weight or energy level  Cardiac:  As per HPI  Respiratory:  Uses CPAP at night  GI:   Colonoscopy in 2019  GU:   History of BPH  Vascular:  No symptoms of claudication; endorses varicose veins  Neuro:   No strokes or TIAs  Musculoskeletal: Denies arthritis or joint problems  Skin:   Negative  Psych:   No anxiety or depression  Eyes:   History of floaters, wears glasses  ENT:   Reports hearing loss, saw dentist last year  Hematologic:  On Xarelto  Endocrine:  No diabetes or thyroid disorders    Physical Exam:   BP (!) 165/84 (BP Location: Left Arm, Patient Position: Sitting, Cuff Size: Normal)   Pulse 80   Resp 20   Ht 5\' 7"  (1.702 m)   Wt 76.7 kg   SpO2 99% Comment: RA  BMI 26.47 kg/m   General:    well-appearing  HEENT:  Unremarkable   Neck:   no JVD, no bruits, no adenopathy   Chest:   clear to auscultation, symmetrical breath sounds, no wheezes, no rhonchi   CV:   RRR, no detectable murmur   Abdomen:  soft, non-tender, no masses   Extremities:  warm, well-perfused, pulses intact throughout, no LE edema  Rectal/GU  Deferred  Neuro:   Grossly non-focal and symmetrical throughout  Skin:   Clean and dry, no rashes, no breakdown   Diagnostic Tests:  I have personally reviewed his CT scan which demonstrates a 4.5 cm ascending aortic aneurysm  (May, 2022)   Impression:  75 year old man with incidentally discovered ascending aortic aneurysm  representing a low risk for urgent aortic condition due to small size and controlled hypertension   Plan:  Follow-up in 1 year with repeat scanning Continue blood pressure control   I spent in excess of 20 minutes during the conduct of this office consultation and >50%  of this time involved direct face-to-face encounter with the patient for counseling and/or coordination of their care.          Level 3 Office Consult = 40 minutes         Level 4 Office Consult = 60 minutes         Level 5 Office Consult = 80 minutes  B.  Lorayne Marek, MD 07/03/2021 1:28 PM

## 2021-07-10 NOTE — Progress Notes (Signed)
Remote pacemaker transmission.   

## 2021-07-16 DIAGNOSIS — R972 Elevated prostate specific antigen [PSA]: Secondary | ICD-10-CM | POA: Diagnosis not present

## 2021-07-16 DIAGNOSIS — N401 Enlarged prostate with lower urinary tract symptoms: Secondary | ICD-10-CM | POA: Diagnosis not present

## 2021-07-24 DIAGNOSIS — I5030 Unspecified diastolic (congestive) heart failure: Secondary | ICD-10-CM | POA: Diagnosis not present

## 2021-07-24 DIAGNOSIS — E785 Hyperlipidemia, unspecified: Secondary | ICD-10-CM | POA: Diagnosis not present

## 2021-07-24 DIAGNOSIS — I480 Paroxysmal atrial fibrillation: Secondary | ICD-10-CM | POA: Diagnosis not present

## 2021-07-24 DIAGNOSIS — I77810 Thoracic aortic ectasia: Secondary | ICD-10-CM | POA: Diagnosis not present

## 2021-07-25 DIAGNOSIS — R0683 Snoring: Secondary | ICD-10-CM | POA: Diagnosis not present

## 2021-07-25 DIAGNOSIS — G4733 Obstructive sleep apnea (adult) (pediatric): Secondary | ICD-10-CM | POA: Diagnosis not present

## 2021-09-17 ENCOUNTER — Ambulatory Visit (INDEPENDENT_AMBULATORY_CARE_PROVIDER_SITE_OTHER): Payer: Medicare Other

## 2021-09-17 DIAGNOSIS — I441 Atrioventricular block, second degree: Secondary | ICD-10-CM

## 2021-09-18 LAB — CUP PACEART REMOTE DEVICE CHECK
Battery Remaining Longevity: 102 mo
Battery Remaining Percentage: 89 %
Battery Voltage: 3.01 V
Brady Statistic AP VP Percent: 44 %
Brady Statistic AP VS Percent: 5 %
Brady Statistic AS VP Percent: 39 %
Brady Statistic AS VS Percent: 11 %
Brady Statistic RA Percent Paced: 48 %
Brady Statistic RV Percent Paced: 83 %
Date Time Interrogation Session: 20221019040014
Implantable Lead Implant Date: 20211019
Implantable Lead Implant Date: 20211019
Implantable Lead Location: 753859
Implantable Lead Location: 753860
Implantable Pulse Generator Implant Date: 20211019
Lead Channel Impedance Value: 430 Ohm
Lead Channel Impedance Value: 530 Ohm
Lead Channel Pacing Threshold Amplitude: 0.625 V
Lead Channel Pacing Threshold Amplitude: 0.875 V
Lead Channel Pacing Threshold Pulse Width: 0.5 ms
Lead Channel Pacing Threshold Pulse Width: 0.5 ms
Lead Channel Sensing Intrinsic Amplitude: 5 mV
Lead Channel Sensing Intrinsic Amplitude: 7.3 mV
Lead Channel Setting Pacing Amplitude: 1.125
Lead Channel Setting Pacing Amplitude: 1.625
Lead Channel Setting Pacing Pulse Width: 0.5 ms
Lead Channel Setting Sensing Sensitivity: 4 mV
Pulse Gen Model: 2272
Pulse Gen Serial Number: 6220383

## 2021-09-22 NOTE — Progress Notes (Signed)
Remote pacemaker transmission.   

## 2021-10-13 ENCOUNTER — Other Ambulatory Visit: Payer: Self-pay | Admitting: Surgery

## 2021-10-13 DIAGNOSIS — I7121 Aneurysm of the ascending aorta, without rupture: Secondary | ICD-10-CM

## 2021-10-21 ENCOUNTER — Telehealth: Payer: Self-pay | Admitting: Cardiology

## 2021-10-21 NOTE — Telephone Encounter (Signed)
Patient called stating the [facility] is not in his BC/BS HMO Plan.  He would like to be referred to someone who is.

## 2021-10-21 NOTE — Telephone Encounter (Signed)
Left VM for pt to call back.

## 2021-10-22 IMAGING — CT CT CERVICAL SPINE W/O CM
3 of 4 series · 13 of 33 positions shown, 16 images · non-contrast
Comparison: None.

CLINICAL DATA: Fall

EXAM:
CT CERVICAL SPINE WITHOUT CONTRAST
TECHNIQUE: Multidetector CT imaging of the cervical spine was performed without
intravenous contrast. Multiplanar CT image reconstructions were also
generated.

[Series 5: c_spine 2.0 st · axial · 0.32mm/px · z∈[-232,-112]mm · 5 of 90 slices shown, 7 images]
[im 15/90  soft-tissue]
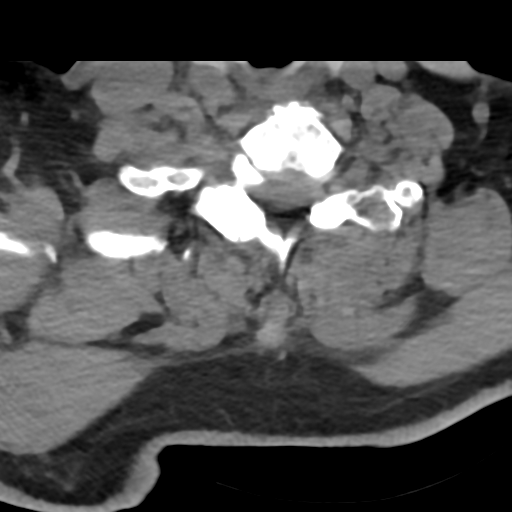
[im 15/90  bone]
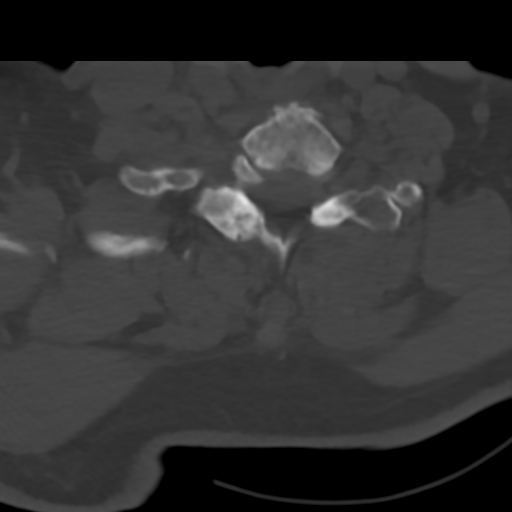
[im 30/90  bone]
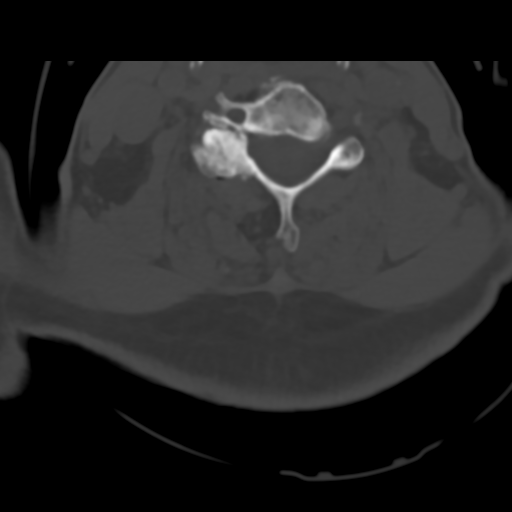
[im 45/90  bone]
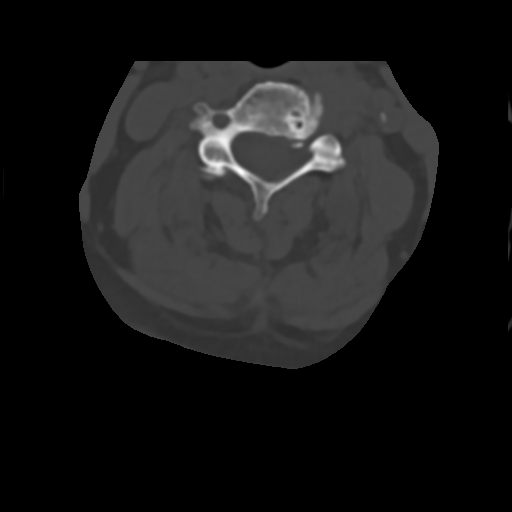
[im 60/90  bone]
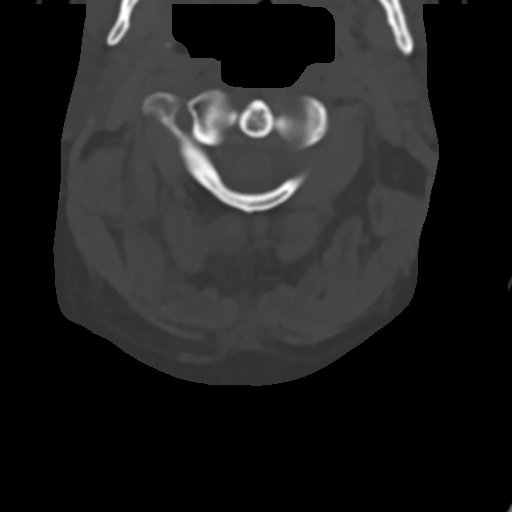
[im 75/90  soft-tissue]
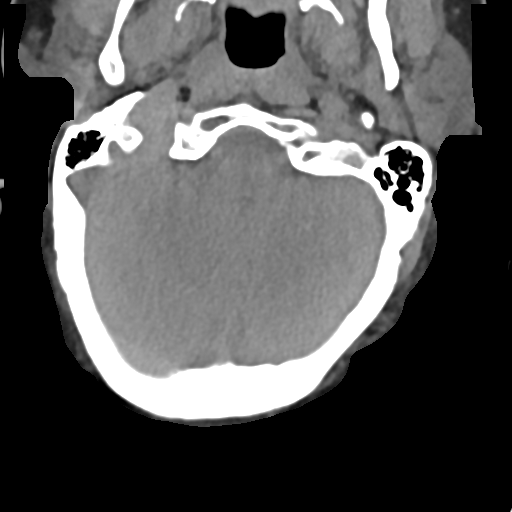
[im 75/90  bone]
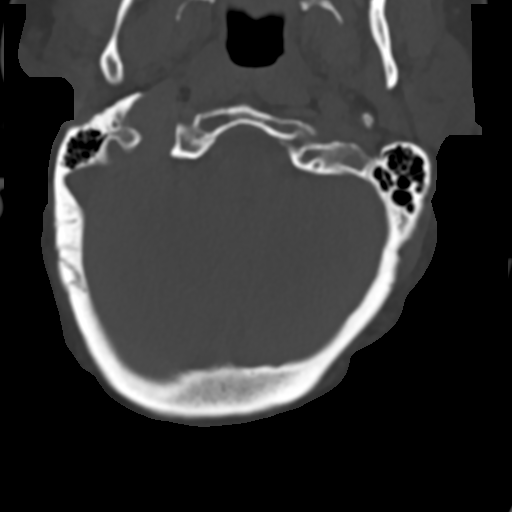

[Series 6: c_spine 2.0 sag bone · sagittal · 0.26mm/px · 5 of 61 slices shown, 6 images]
[im 21/61  bone]
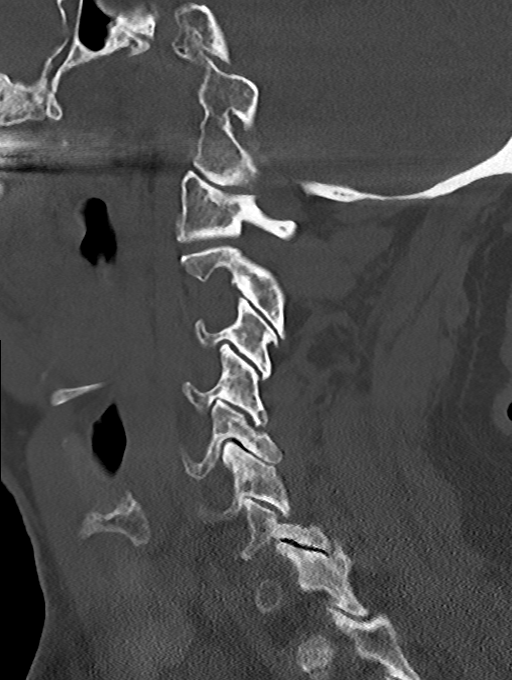
[im 26/61  bone]
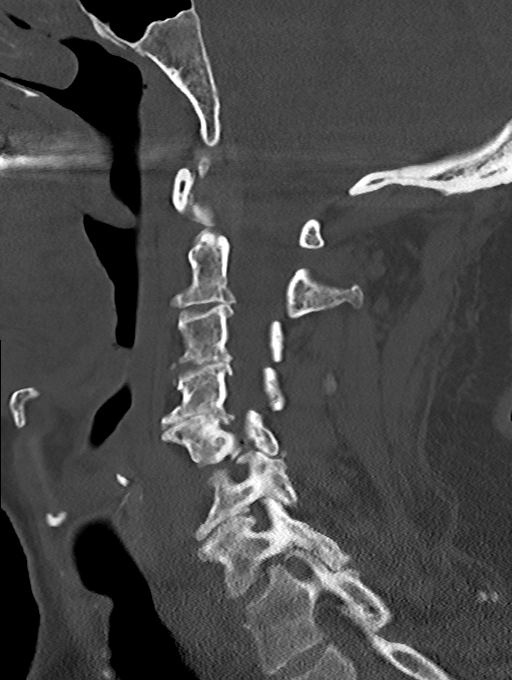
[im 31/61  soft-tissue]
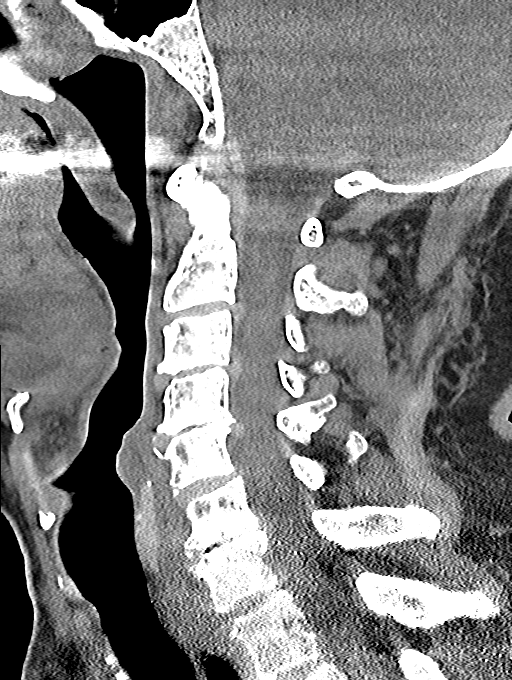
[im 31/61  bone]
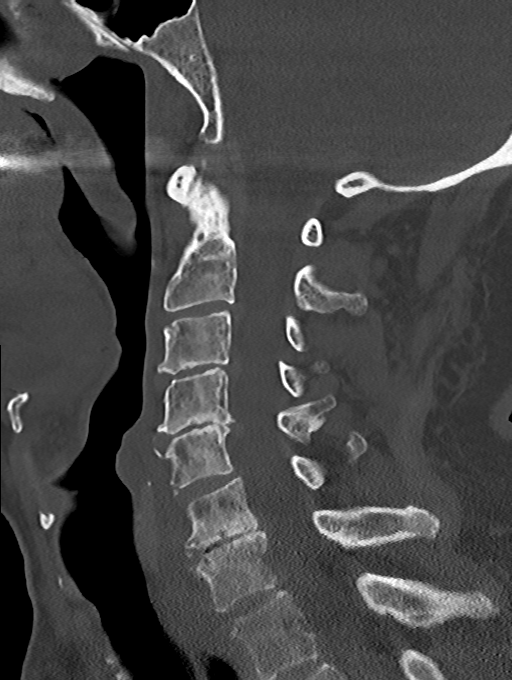
[im 36/61  bone]
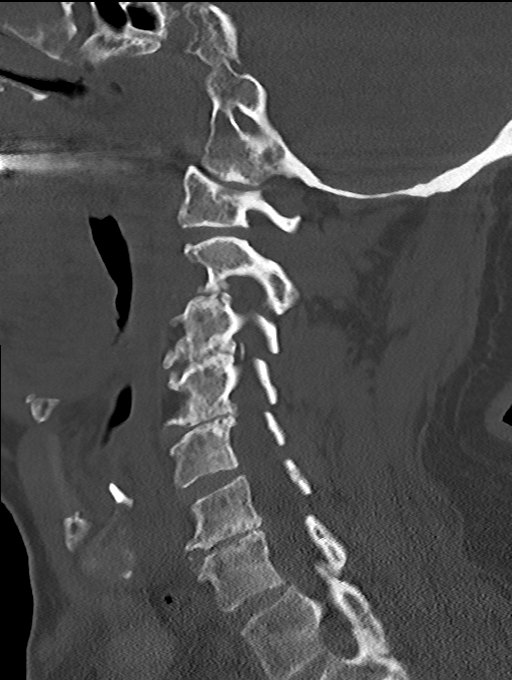
[im 41/61  bone]
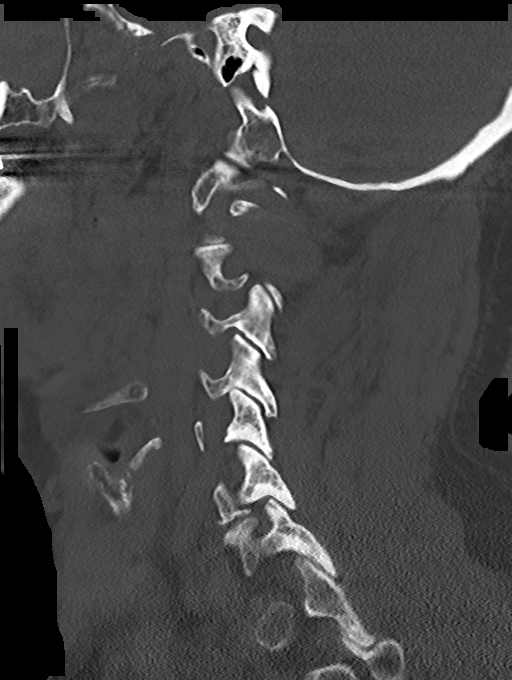

[Series 7: c_spine 2.0 cor bone · coronal · 0.26mm/px · 3 of 61 slices shown]
[im 13/61  bone]
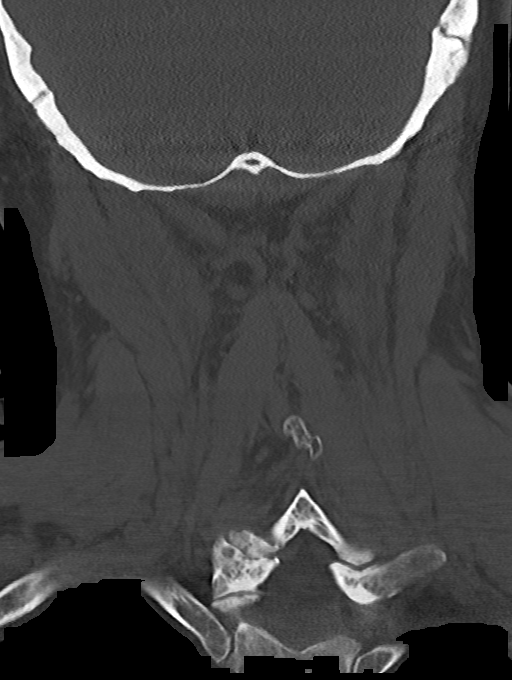
[im 25/61  bone]
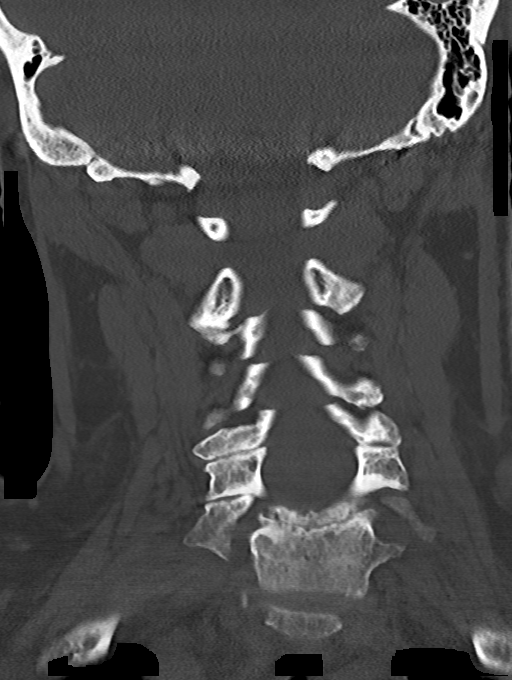
[im 37/61  bone]
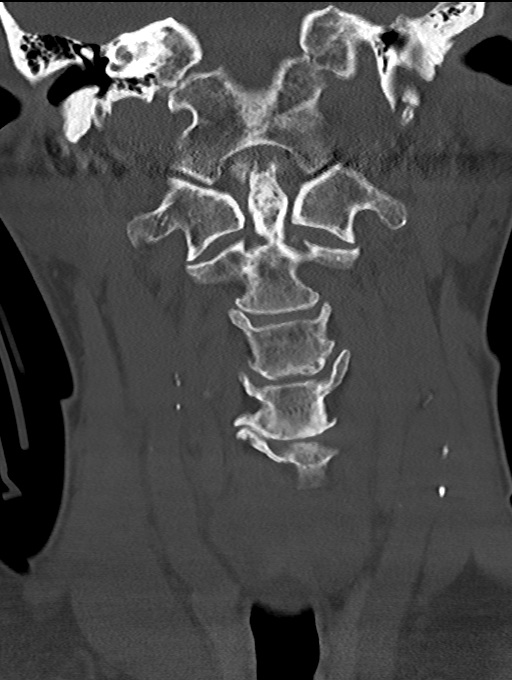

[13 of 33 positions shown; findings below may reference images not displayed]

FINDINGS: Alignment: No subluxation.

Skull base and vertebrae: No acute fracture. No primary bone lesion
or focal pathologic process.

Soft tissues and spinal canal: No prevertebral fluid or swelling. No
visible canal hematoma.

Disc levels: Diffuse advanced degenerative disc disease and facet
disease bilaterally.

Upper chest: No acute findings

Other: None
IMPRESSION: Diffuse degenerative disc and facet disease. No acute bony
abnormality.

## 2021-10-22 IMAGING — CT CT MAXILLOFACIAL W/O CM
3 series · 16 of 47 positions shown, 19 images · non-contrast
Comparison: None.

CLINICAL DATA: Fall, facial trauma.  Laceration under right eye

EXAM:
CT MAXILLOFACIAL WITHOUT CONTRAST
TECHNIQUE: Multidetector CT imaging of the maxillofacial structures was
performed. Multiplanar CT image reconstructions were also generated.

[Series 4: facialbone 2.0 st · axial · 0.38mm/px · z∈[-170,-50]mm · 10 of 70 slices shown, 13 images]
[im 5/70  brain]
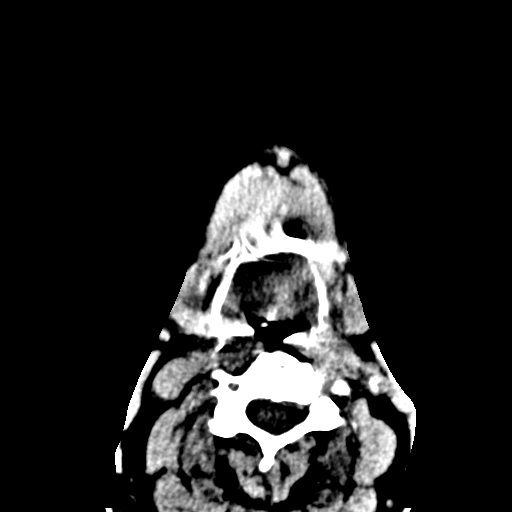
[im 5/70  bone]
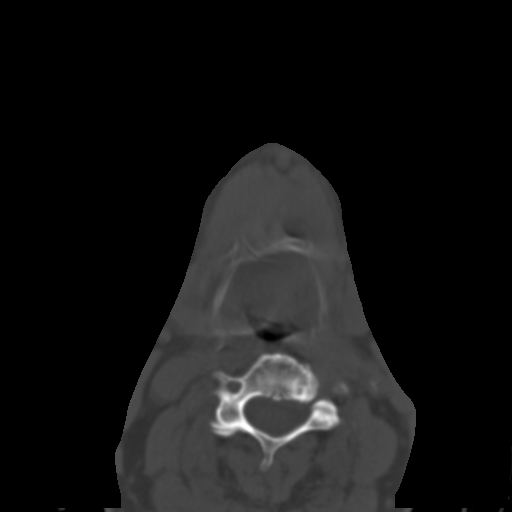
[im 12/70  bone]
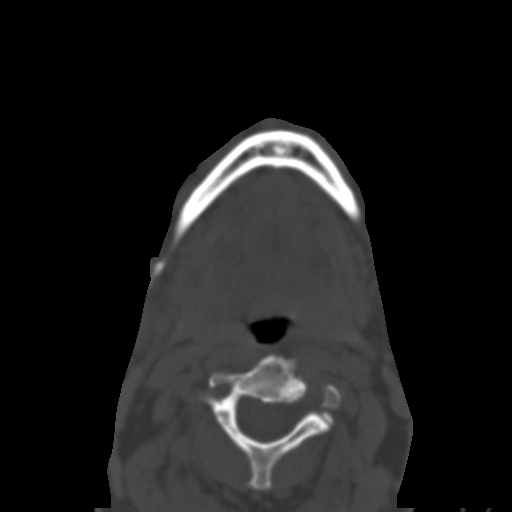
[im 20/70  bone]
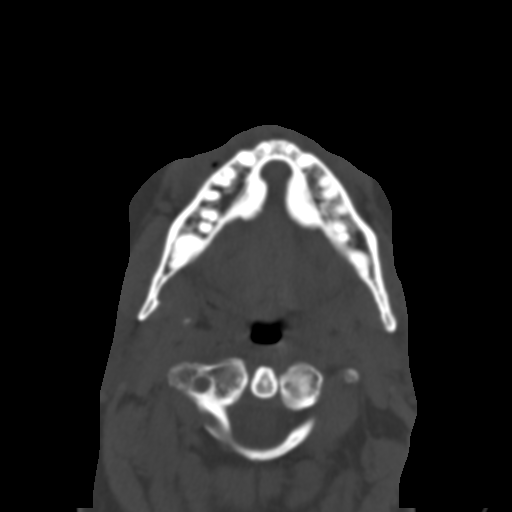
[im 24/70  bone]
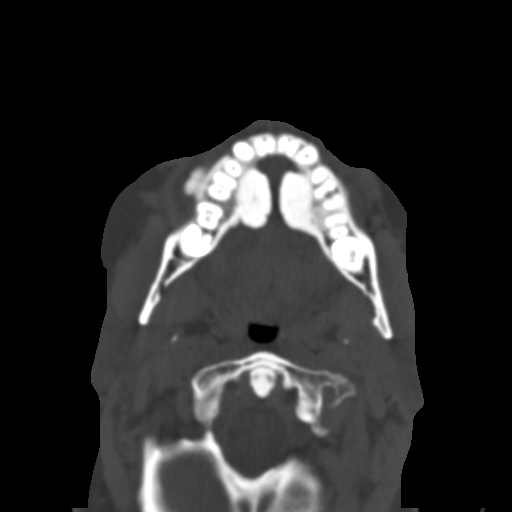
[im 31/70  brain]
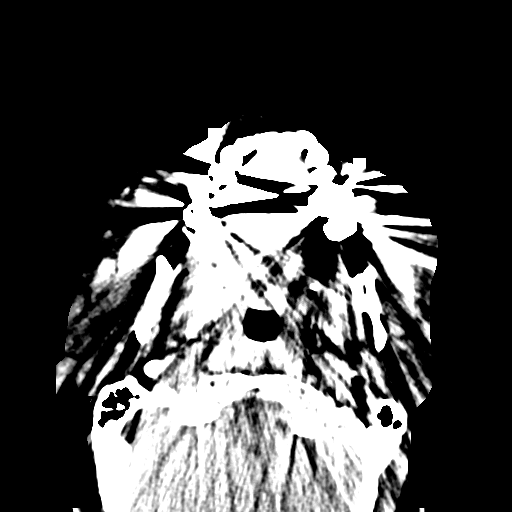
[im 31/70  bone]
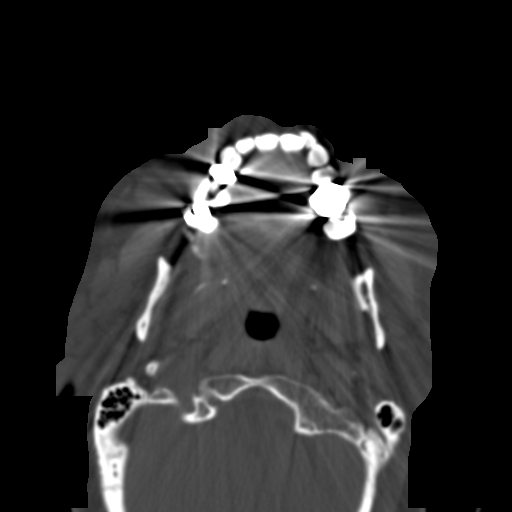
[im 39/70  bone]
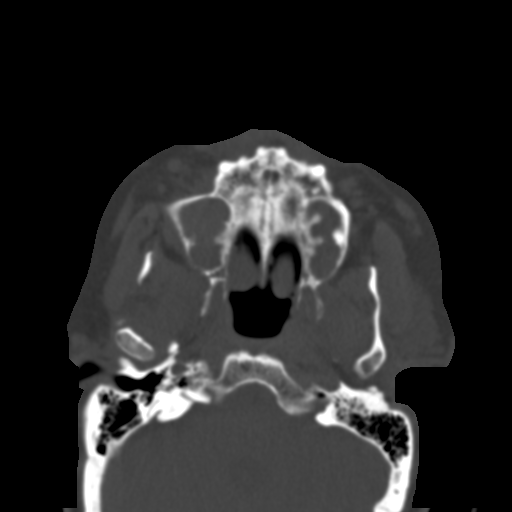
[im 46/70  bone]
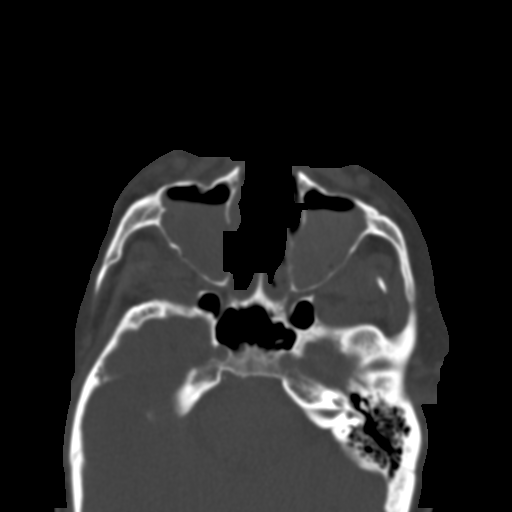
[im 53/70  bone]
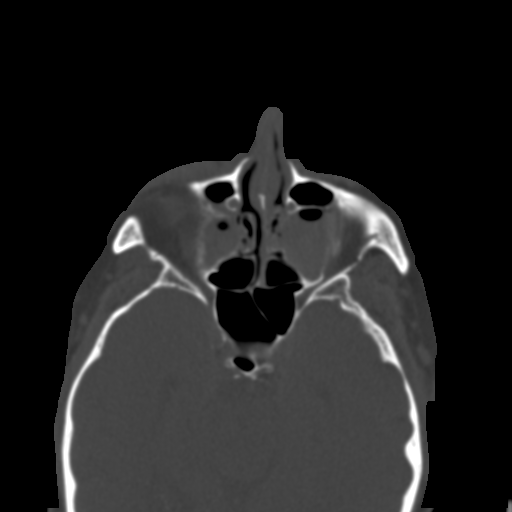
[im 58/70  brain]
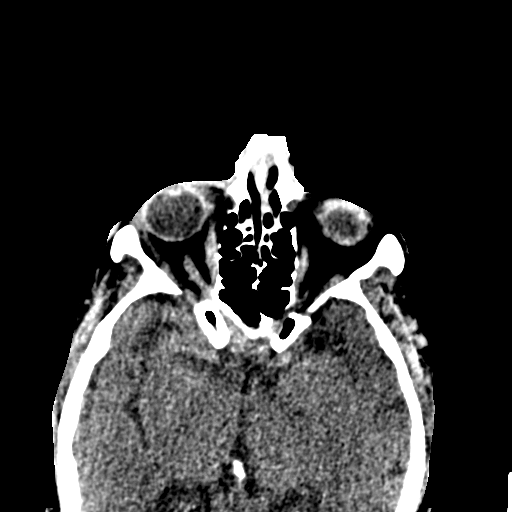
[im 58/70  bone]
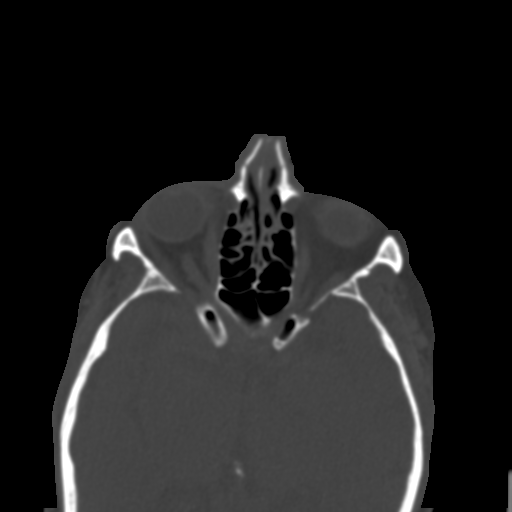
[im 65/70  bone]
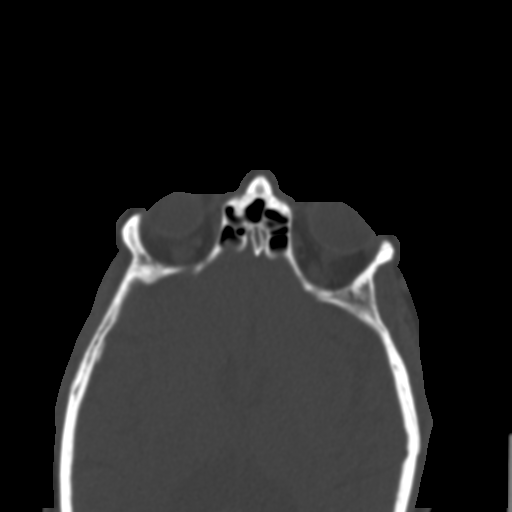

[Series 7: facialbone 2.0 cor st · coronal · 0.33mm/px · 3 of 76 slices shown]
[im 26/76  bone]
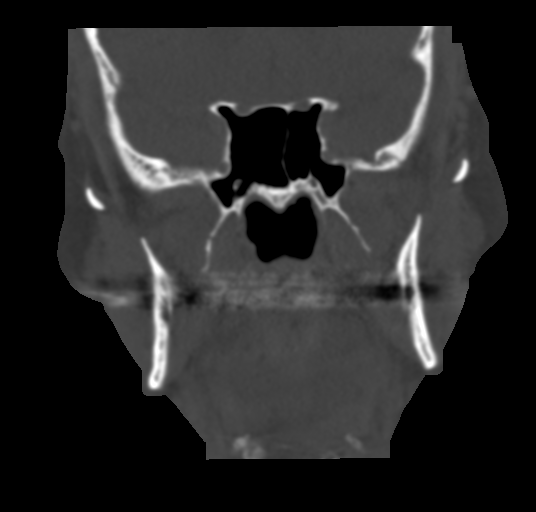
[im 34/76  bone]
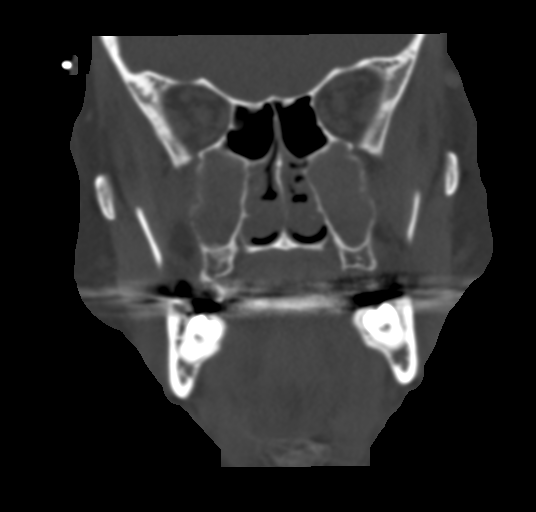
[im 42/76  bone]
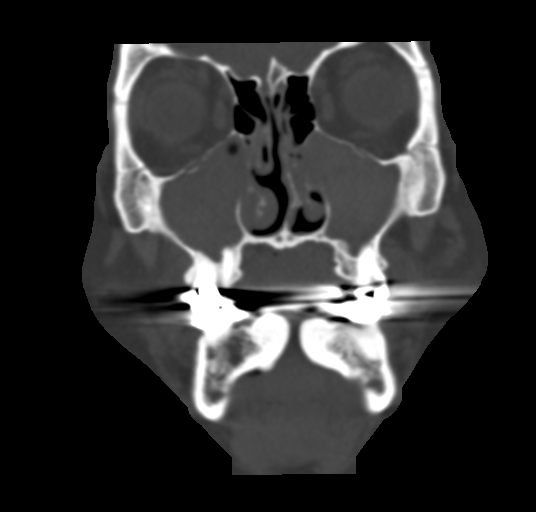

[Series 8: facialbone 2.0 sag st · sagittal · 0.28mm/px · 3 of 76 slices shown]
[im 26/76  bone]
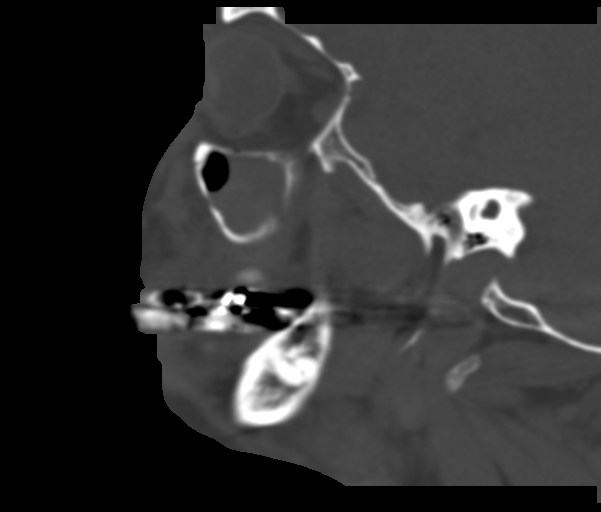
[im 38/76  bone]
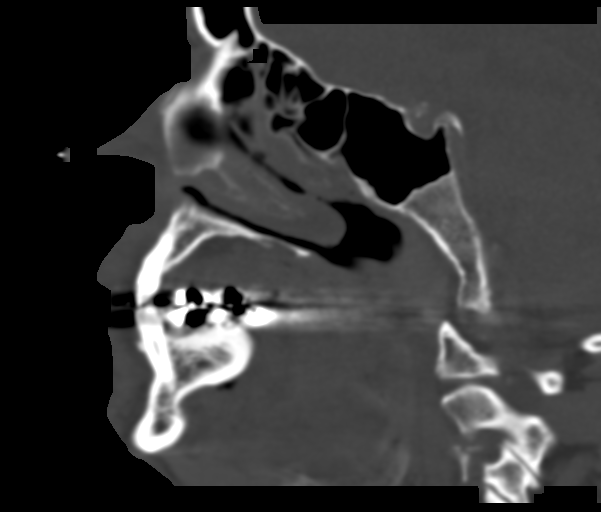
[im 51/76  bone]
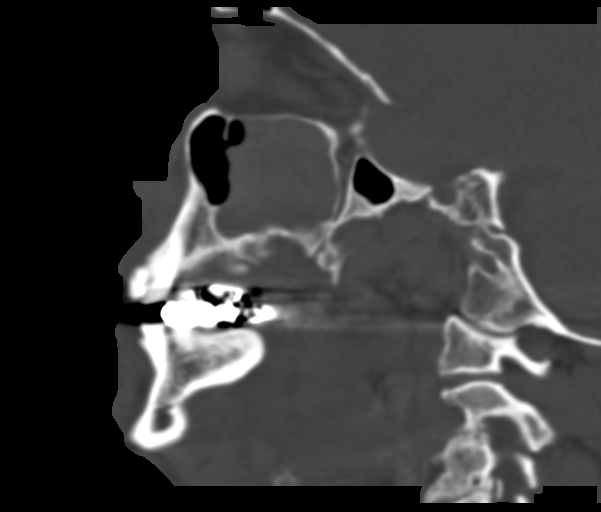

[16 of 47 positions shown; findings below may reference images not displayed]

FINDINGS: Osseous: No fracture or mandibular dislocation. No destructive
process.

Orbits: Negative. No traumatic or inflammatory finding.

Sinuses: Air-fluid level seen within both maxillary sinuses. Mild
mucosal thickening in the ethmoid air cells. Mastoid air cells
clear.

Soft tissues: Mild soft tissue swelling in the right cheek region.

Limited intracranial: No acute findings
IMPRESSION: No facial or orbital fracture.

Bilateral acute maxillary sinusitis.

## 2021-10-29 NOTE — Telephone Encounter (Signed)
Pt has been instructed to call his insurance for in network cardiac and thoracic surgery. Pt verbalized understanding and had no additional questions.

## 2021-11-04 DIAGNOSIS — I480 Paroxysmal atrial fibrillation: Secondary | ICD-10-CM | POA: Diagnosis not present

## 2021-11-04 DIAGNOSIS — E785 Hyperlipidemia, unspecified: Secondary | ICD-10-CM | POA: Diagnosis not present

## 2021-11-04 DIAGNOSIS — R972 Elevated prostate specific antigen [PSA]: Secondary | ICD-10-CM | POA: Diagnosis not present

## 2021-11-04 DIAGNOSIS — Z131 Encounter for screening for diabetes mellitus: Secondary | ICD-10-CM | POA: Diagnosis not present

## 2021-11-26 ENCOUNTER — Other Ambulatory Visit: Payer: Self-pay

## 2021-11-26 ENCOUNTER — Ambulatory Visit: Payer: Medicare Other | Admitting: Surgery

## 2021-11-26 ENCOUNTER — Ambulatory Visit
Admission: RE | Admit: 2021-11-26 | Discharge: 2021-11-26 | Disposition: A | Discharge status: Home / Self Care | Payer: Medicare Other | Source: Ambulatory Visit | Attending: Surgery | Admitting: Surgery

## 2021-11-26 DIAGNOSIS — I7121 Aneurysm of the ascending aorta, without rupture: Secondary | ICD-10-CM

## 2021-11-26 DIAGNOSIS — I712 Thoracic aortic aneurysm, without rupture, unspecified: Secondary | ICD-10-CM | POA: Diagnosis not present

## 2021-11-26 DIAGNOSIS — R911 Solitary pulmonary nodule: Secondary | ICD-10-CM | POA: Diagnosis not present

## 2021-11-28 ENCOUNTER — Ambulatory Visit: Payer: Medicare Other | Admitting: Cardiology

## 2021-12-02 ENCOUNTER — Other Ambulatory Visit: Payer: Self-pay

## 2021-12-02 ENCOUNTER — Encounter: Payer: Self-pay | Admitting: Surgery

## 2021-12-02 ENCOUNTER — Ambulatory Visit: Payer: Medicare Other | Admitting: Surgery

## 2021-12-02 VITALS — BP 170/78 | HR 82 | Resp 20 | Ht 67.0 in | Wt 164.0 lb

## 2021-12-02 DIAGNOSIS — I7121 Aneurysm of the ascending aorta, without rupture: Secondary | ICD-10-CM

## 2021-12-03 ENCOUNTER — Ambulatory Visit: Payer: Medicare Other | Admitting: Cardiology

## 2021-12-03 ENCOUNTER — Encounter: Payer: Self-pay | Admitting: Cardiology

## 2021-12-03 VITALS — BP 152/70 | HR 72 | Ht 67.0 in | Wt 167.8 lb

## 2021-12-03 DIAGNOSIS — E7800 Pure hypercholesterolemia, unspecified: Secondary | ICD-10-CM

## 2021-12-03 DIAGNOSIS — I7121 Aneurysm of the ascending aorta, without rupture: Secondary | ICD-10-CM | POA: Diagnosis not present

## 2021-12-03 DIAGNOSIS — Z950 Presence of cardiac pacemaker: Secondary | ICD-10-CM | POA: Diagnosis not present

## 2021-12-03 DIAGNOSIS — I442 Atrioventricular block, complete: Secondary | ICD-10-CM | POA: Diagnosis not present

## 2021-12-03 DIAGNOSIS — I480 Paroxysmal atrial fibrillation: Secondary | ICD-10-CM

## 2021-12-03 MED ORDER — RIVAROXABAN 20 MG PO TABS: 20.0000 mg | ORAL_TABLET | Freq: Every day | ORAL | 3 refills | Status: DC

## 2021-12-03 NOTE — Patient Instructions (Signed)
Medication Instructions:  Your physician recommends that you continue on your current medications as directed. Please refer to the Current Medication list given to you today.  *If you need a refill on your cardiac medications before your next appointment, please call your pharmacy*   Lab Work: None If you have labs (blood work) drawn today and your tests are completely normal, you will receive your results only by: MyChart Message (if you have MyChart) OR A paper copy in the mail If you have any lab test that is abnormal or we need to change your treatment, we will call you to review the results.   Testing/Procedures: None   Follow-Up: At [facility], you and your health needs are our priority.  As part of our continuing mission to provide you with exceptional heart care, we have created designated Provider Care Teams.  These Care Teams include your primary Cardiologist (physician) and Advanced Practice Providers (APPs -  Physician Assistants and Nurse Practitioners) who all work together to provide you with the care you need, when you need it.  We recommend signing up for the patient portal called "MyChart".  Sign up information is provided on this After Visit Summary.  MyChart is used to connect with patients for Virtual Visits (Telemedicine).  Patients are able to view lab/test results, encounter notes, upcoming appointments, etc.  Non-urgent messages can be sent to your provider as well.   To learn more about what you can do with MyChart, go to [email or web address].    Your next appointment:   6 month(s)  The format for your next appointment:   In Person  Provider:   Belva Crome, MD    Other Instructi

## 2021-12-03 NOTE — Progress Notes (Signed)
Cardiology Office Note:    Date:  12/03/2021   ID:  Ricky Hood, DOB May 11, 1946, MRN [id number]  PCP:  Renaldo Reel, PA  Cardiologist:  Jenean Lindau, MD   Referring MD: Renaldo Reel, PA    ASSESSMENT:    1. Aneurysm of ascending aorta without rupture   2. Complete heart block ([place])   3. Hypercholesteremia   4. Presence of permanent cardiac pacemaker   5. [employer] Jude Medical Assurity MRI conditional  dual-chamber pacemaker for symptomatic complete heart block     6. Paroxysmal atrial fibrillation (HCC)    PLAN:    In order of problems listed above:  Primary prevention stressed with the patient.  Importance of compliance with diet medication stressed any vocalized understanding.  He was advised to continue his excellent exercise program and is agreeable. Is ascending aortic aneurysm: Saw cardiothoracic surgeon yesterday and was told that his aneurysm is stable and he will be continuing his monitoring by their office. Paroxysmal atrial fibrillation:I discussed with the patient atrial fibrillation, disease process. Management and therapy including rate and rhythm control, anticoagulation benefits and potential risks were discussed extensively with the patient. Patient had multiple questions which were answered to patient's satisfaction. Essential hypertension: Blood pressure stable and his blood test blood pressure readings from home and they are fine. Mixed dyslipidemia: On statin therapy and managed by primary care.  Statins intake is very regular.  Lipids reviewed from [facility] sheet and discussed with the patient. Patient will be seen in follow-up appointment in 6 months or earlier if the patient has any concerns    Medication Adjustments/Labs and Tests Ordered: Current medicines are reviewed at length with the patient today.  Concerns regarding medicines are outlined above.  No orders of the defined types were placed in this encounter.  No orders of the defined types were placed  in this encounter.    No chief complaint on file.    History of Present Illness:    Ricky Hood is a 76 y.o. male.  Patient has past medical history of ascending aortic aneurysm, paroxysmal atrial fibrillation and complete heart block for which he has permanent pacemaker.  He denies any chest pain orthopnea or PND.  He walks on a regular basis.  He takes good care of himself.  He has blood pressure readings from home and they are fine.  At the time of my evaluation, the patient is alert awake oriented and in no distress.  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/24/2020   Hypercholesteremia    Hyperlipidemia 11/24/2020   Hypersomnia 08/10/2014   Hypertension    Hyponatremia 11/24/2020   Obstructive sleep apnea (adult) (pediatric) 08/10/2014   Paroxysmal atrial fibrillation (HCC)    Pericardial effusion 11/24/2020   Pleural effusion    Presence of permanent cardiac pacemaker 09/17/2020   Second degree AV block    Snoring 08/10/2014   St Jude Medical Assurity MRI conditional  dual-chamber pacemaker for symptomatic complete heart block   09/24/2020    Past Surgical History:  Procedure Laterality Date   BIOPSY PROSTATE     COLONOSCOPY     PACEMAKER IMPLANT N/A 09/17/2020   SJM Assurity DR implanted by Dr Rayann Heman for AV block and syncope    Current Medications: Current Meds  Medication Sig   acetaminophen (TYLENOL) 325 MG tablet  Take 325-650 mg by mouth daily as needed for headache (pain).   ascorbic acid (VITAMIN C) 500 MG tablet Take 500 mg by mouth daily after supper.   Cholecalciferol (VITAMIN D3) 50 MCG (2000 UT) TABS Take 2,000 Units by mouth daily after supper.   Coenzyme Q10 (COQ10) 50 MG CAPS Take 50 mg by mouth daily.   finasteride (PROSCAR) 5 MG tablet Take 5 mg by mouth daily after supper.   fish oil-omega-3  fatty acids 1000 MG capsule Take 3 g by mouth daily after supper.   furosemide (LASIX) 40 MG tablet Take 40 mg by mouth as needed for edema. Or weight gain of 3 pounds in 1 day or 5 pounds in 1 week   ibuprofen (ADVIL) 600 MG tablet Take 1 tablet (600 mg total) by mouth 3 (three) times daily.   lisinopril (ZESTRIL) 10 MG tablet Take 10 mg by mouth daily after supper.   loratadine (CLARITIN) 10 MG tablet Take 10 mg by mouth daily after supper.   Multiple Vitamin (MULTIVITAMIN WITH MINERALS) TABS tablet Take 1 tablet by mouth daily after supper.   pantoprazole (PROTONIX) 40 MG tablet Take 1 tablet by mouth daily.   rivaroxaban (XARELTO) 20 MG TABS tablet Take 1 tablet (20 mg total) by mouth daily with supper.   rosuvastatin (CRESTOR) 5 MG tablet Take 5 mg by mouth daily after supper.   TART CHERRY PO Take 30 mLs by mouth 2 (two) times daily with a meal.     Allergies:   Patient has no known allergies.   Social History   Socioeconomic History   Marital status: Married    Spouse name: Not on file   Number of children: 3   Years of education: Not on file   Highest education level: Not on file  Occupational History   Not on file  Tobacco Use   Smoking status: Never   Smokeless tobacco: Never  Vaping Use   Vaping Use: Never used  Substance and Sexual Activity   Alcohol use: Never   Drug use: Never   Sexual activity: Not on file  Other Topics Concern   Not on file  Social History Narrative   Not on file   Social Determinants of Health   Financial Resource Strain: Not on file  Food Insecurity: Not on file  Transportation Needs: Not on file  Physical Activity: Not on file  Stress: Not on file  Social Connections: Not on file     Family History: The patient's family history includes Hyperlipidemia in his father and mother; Hypertension in his father and mother; Other in his mother.  ROS:   Please see the history of present illness.    All other systems reviewed and are  negative.  EKGs/Labs/Other Studies Reviewed:    The following studies were reviewed today: I discussed my findings with the patient at length.   Recent Labs: 06/18/2021: ALT 19; BUN 21; Creatinine, Ser 1.15; Hemoglobin 12.7; Platelets 182; Potassium 4.5; Sodium 136; TSH 4.610  Recent Lipid Panel    Component Value Date/Time   CHOL 117 06/18/2021 0956   TRIG 35 06/18/2021 0956   HDL 58 06/18/2021 0956   CHOLHDL 2.0 06/18/2021 0956   LDLCALC 49 06/18/2021 0956    Physical Exam:    VS:  BP (!) 152/70    Pulse 72    Ht 5\' 7"  (1.702 m)    Wt 167 lb 12.8 oz (76.1 kg)    SpO2 98%    BMI  26.28 kg/m     Wt Readings from Last 3 Encounters:  12/03/21 167 lb 12.8 oz (76.1 kg)  12/02/21 164 lb (74.4 kg)  05/22/21 169 lb (76.7 kg)     GEN: Patient is in no acute distress HEENT: Normal NECK: No JVD; No carotid bruits LYMPHATICS: No lymphadenopathy CARDIAC: Hear sounds regular, 2/6 systolic murmur at the apex. RESPIRATORY:  Clear to auscultation without rales, wheezing or rhonchi  ABDOMEN: Soft, non-tender, non-distended MUSCULOSKELETAL:  No edema; No deformity  SKIN: Warm and dry NEUROLOGIC:  Alert and oriented x 3 PSYCHIATRIC:  Normal affect   Signed, Jenean Lindau, MD  12/03/2021 1:43 PM    [facility]

## 2021-12-03 NOTE — Addendum Note (Signed)
Addended by: Aamira Bischoff, Elmarie Shiley L on: 12/03/2021 02:05 PM   Modules accepted: Orders

## 2021-12-17 ENCOUNTER — Ambulatory Visit (INDEPENDENT_AMBULATORY_CARE_PROVIDER_SITE_OTHER): Payer: Medicare Other

## 2021-12-17 DIAGNOSIS — I441 Atrioventricular block, second degree: Secondary | ICD-10-CM

## 2021-12-17 LAB — CUP PACEART REMOTE DEVICE CHECK
Battery Remaining Longevity: 100 mo
Battery Remaining Percentage: 86 %
Battery Voltage: 3.02 V
Brady Statistic AP VP Percent: 46 %
Brady Statistic AP VS Percent: 3.7 %
Brady Statistic AS VP Percent: 41 %
Brady Statistic AS VS Percent: 8.5 %
Brady Statistic RA Percent Paced: 48 %
Brady Statistic RV Percent Paced: 87 %
Date Time Interrogation Session: 20230118040016
Implantable Lead Implant Date: 20211019
Implantable Lead Implant Date: 20211019
Implantable Lead Location: 753859
Implantable Lead Location: 753860
Implantable Pulse Generator Implant Date: 20211019
Lead Channel Impedance Value: 400 Ohm
Lead Channel Impedance Value: 480 Ohm
Lead Channel Pacing Threshold Amplitude: 0.625 V
Lead Channel Pacing Threshold Amplitude: 0.875 V
Lead Channel Pacing Threshold Pulse Width: 0.5 ms
Lead Channel Pacing Threshold Pulse Width: 0.5 ms
Lead Channel Sensing Intrinsic Amplitude: 5 mV
Lead Channel Sensing Intrinsic Amplitude: 7.2 mV
Lead Channel Setting Pacing Amplitude: 1.125
Lead Channel Setting Pacing Amplitude: 1.625
Lead Channel Setting Pacing Pulse Width: 0.5 ms
Lead Channel Setting Sensing Sensitivity: 4 mV
Pulse Gen Model: 2272
Pulse Gen Serial Number: 6220383

## 2021-12-21 IMAGING — DX DG CHEST 2V
2 series · 2 of 2 positions shown · non-contrast
Comparison: [DATE]o/3939 and prior.

CLINICAL DATA: shob

EXAM:
CHEST - 2 VIEW

[w chest pa]
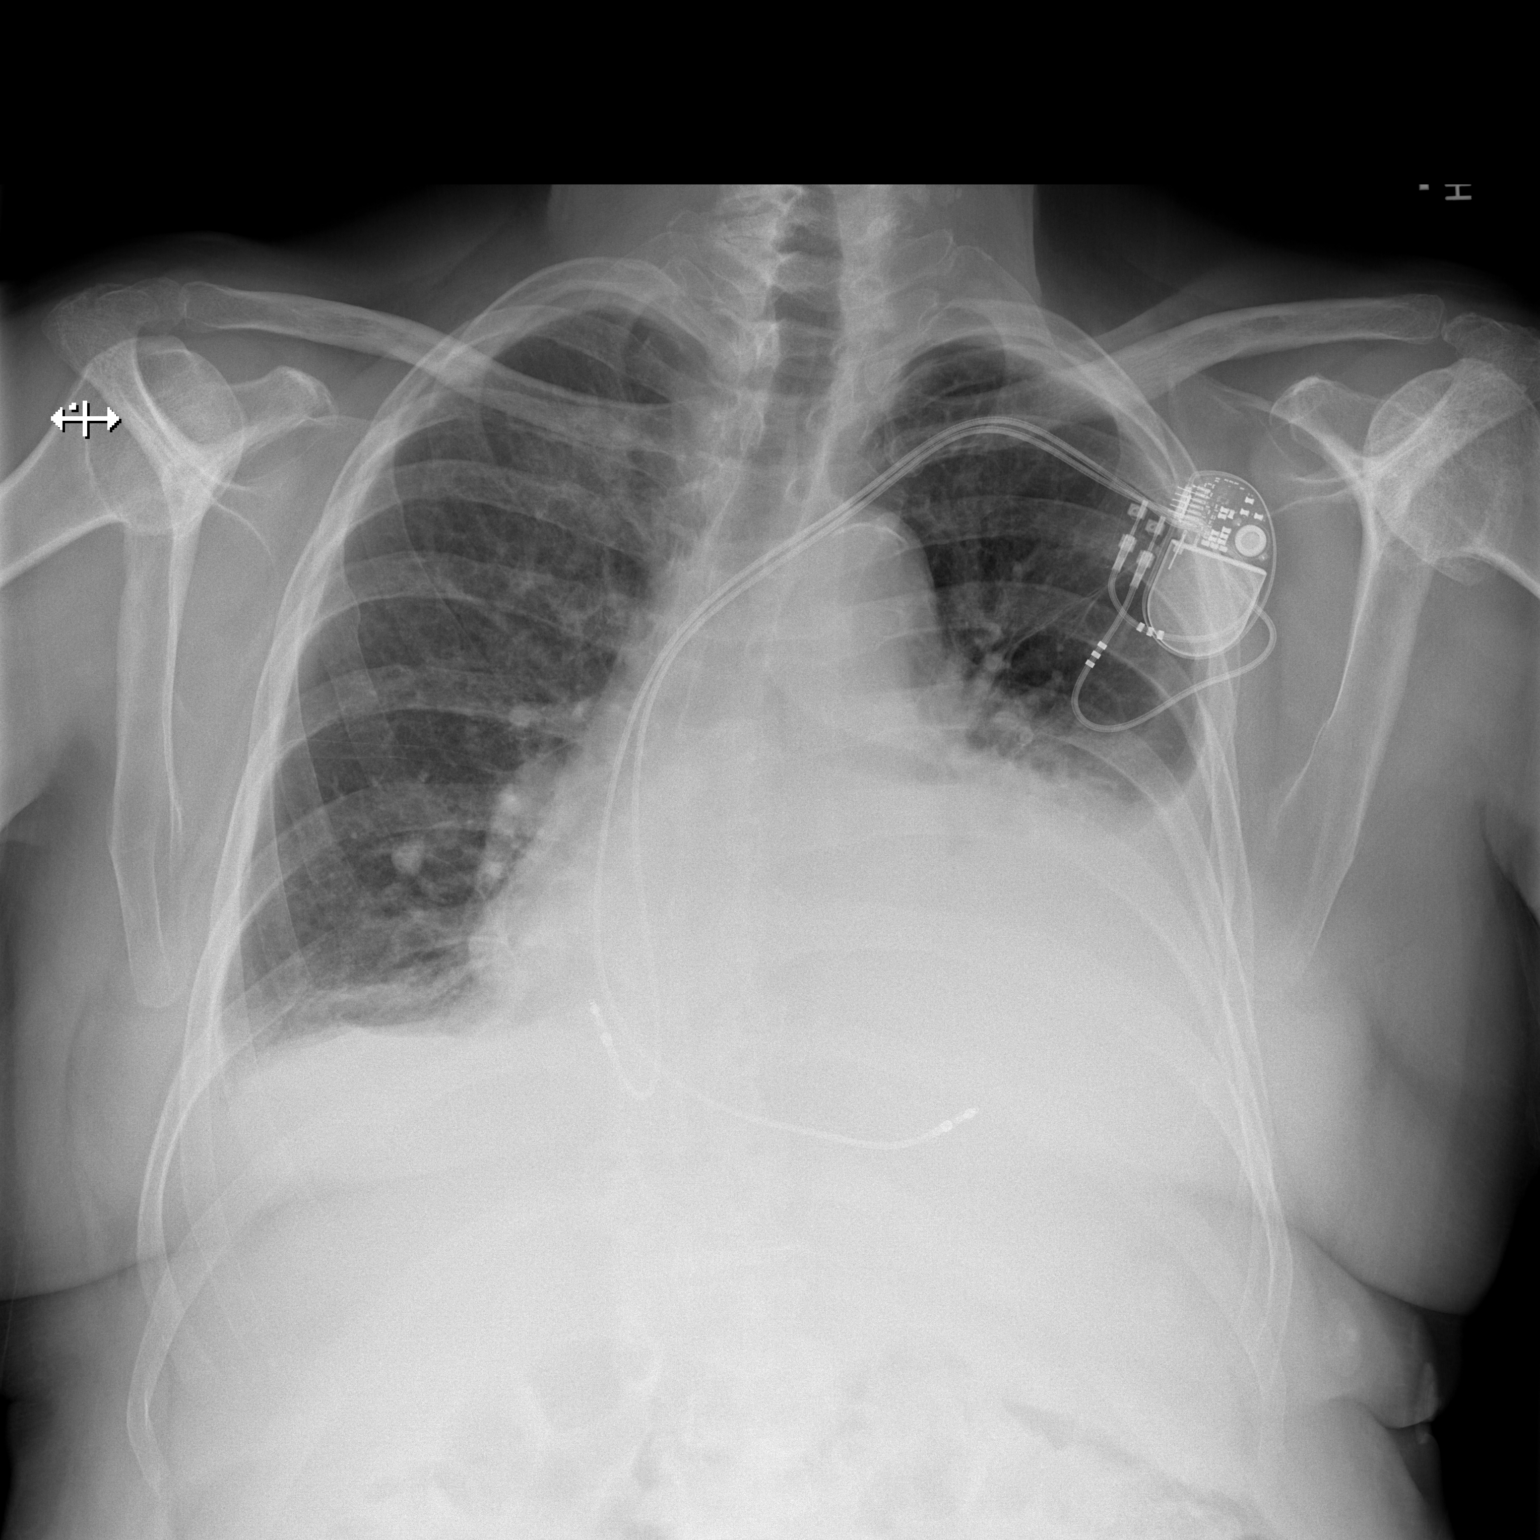

[w chest lat]
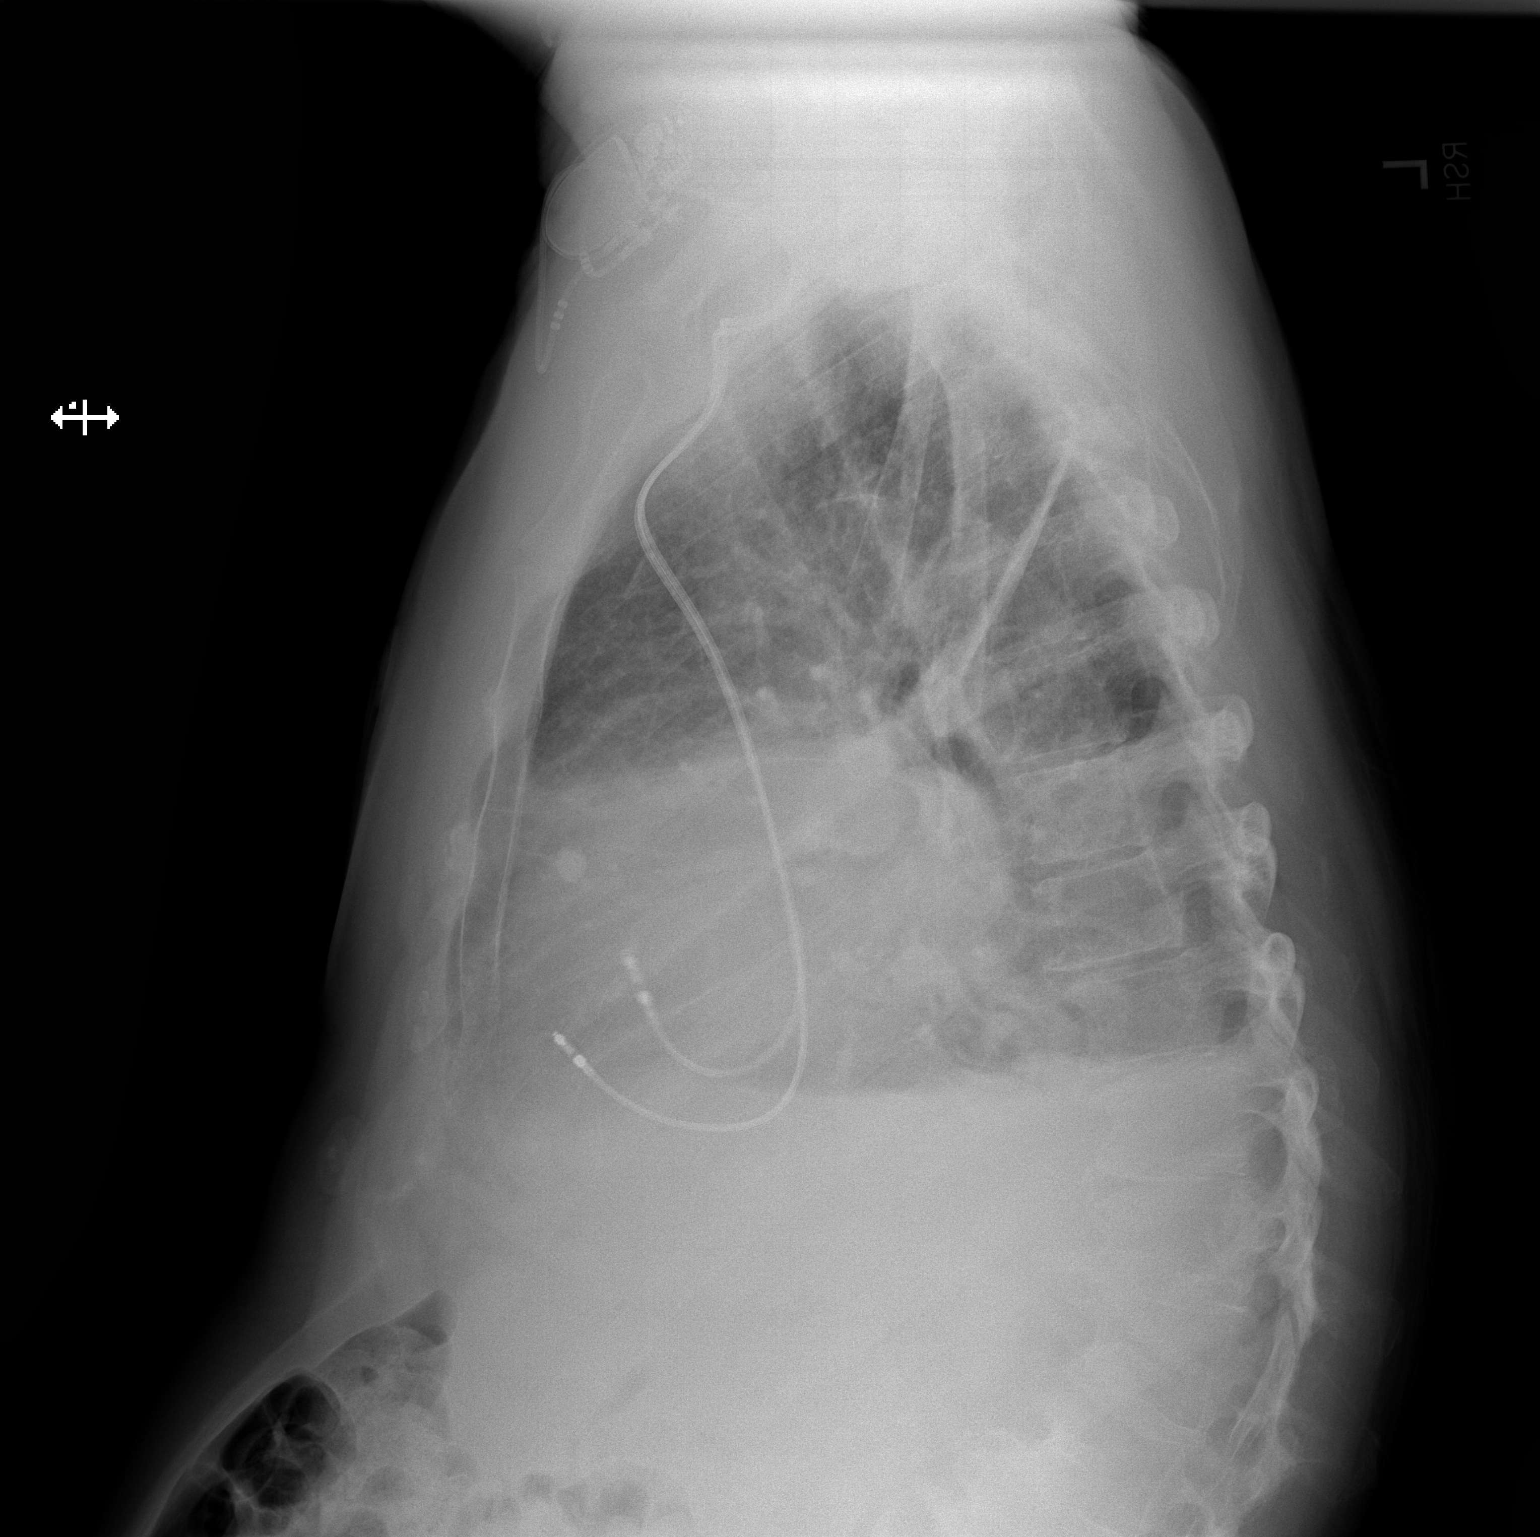

[2 of 2 positions shown; findings below may reference images not displayed]

FINDINGS: Dual lead left chest wall pacing device. New patchy right upper lung
and bibasilar opacities. No pneumothorax. Trace right and moderate
left pleural effusions. Partially obscured cardiomediastinal
silhouette. No acute osseous abnormality.
IMPRESSION: Patchy bilateral pulmonary opacities, infection versus edema.

New moderate left and trace right pleural effusions.

## 2021-12-23 DIAGNOSIS — G4733 Obstructive sleep apnea (adult) (pediatric): Secondary | ICD-10-CM | POA: Diagnosis not present

## 2021-12-30 NOTE — Progress Notes (Signed)
Remote pacemaker transmission.   

## 2022-01-14 DIAGNOSIS — R972 Elevated prostate specific antigen [PSA]: Secondary | ICD-10-CM | POA: Diagnosis not present

## 2022-01-14 DIAGNOSIS — N401 Enlarged prostate with lower urinary tract symptoms: Secondary | ICD-10-CM | POA: Diagnosis not present

## 2022-02-12 DIAGNOSIS — I10 Essential (primary) hypertension: Secondary | ICD-10-CM | POA: Diagnosis not present

## 2022-02-12 DIAGNOSIS — I480 Paroxysmal atrial fibrillation: Secondary | ICD-10-CM | POA: Diagnosis not present

## 2022-02-12 DIAGNOSIS — E785 Hyperlipidemia, unspecified: Secondary | ICD-10-CM | POA: Diagnosis not present

## 2022-02-12 DIAGNOSIS — Z950 Presence of cardiac pacemaker: Secondary | ICD-10-CM | POA: Diagnosis not present

## 2022-03-09 ENCOUNTER — Encounter: Payer: Self-pay | Admitting: Cardiology

## 2022-03-09 ENCOUNTER — Ambulatory Visit: Payer: Medicare Other | Admitting: Cardiology

## 2022-03-09 ENCOUNTER — Ambulatory Visit (INDEPENDENT_AMBULATORY_CARE_PROVIDER_SITE_OTHER): Payer: Medicare Other | Admitting: Cardiology

## 2022-03-09 VITALS — BP 156/72 | HR 65 | Ht 67.0 in | Wt 165.0 lb

## 2022-03-09 DIAGNOSIS — I480 Paroxysmal atrial fibrillation: Secondary | ICD-10-CM | POA: Diagnosis not present

## 2022-03-09 DIAGNOSIS — Z950 Presence of cardiac pacemaker: Secondary | ICD-10-CM | POA: Diagnosis not present

## 2022-03-09 DIAGNOSIS — I441 Atrioventricular block, second degree: Secondary | ICD-10-CM

## 2022-03-09 NOTE — Progress Notes (Signed)
? ?Electrophysiology Office Note ? ? ?Date:  03/09/2022  ? ?ID:  Ricky Hood, DOB [birth year], MRN [id number] ? ?PCP:  Marlyn Corporal, PA  ?Cardiologist: Revankar ?Primary Electrophysiologist:  Sabryna Lahm Jorja Loa, MD   ? ?Chief Complaint: pacemaker ?  ?History of Present Illness: ?Ricky Hood is a 76 y.o. male who is being seen today for the evaluation of pacemkaer at the request of Marlyn Corporal, [place]. Presenting today for electrophysiology evaluation. ? ?He has a history significant for diastolic heart failure, a sending aortic aneurysm, complete heart block, hypertension, hyperlipidemia, obstructive sleep apnea, paroxysmal atrial fibrillation.  He is status post [employer] Jude dual-chamber pacemaker implanted 09/24/2020. ? ?Today, he denies symptoms of palpitations, chest pain, shortness of breath, orthopnea, PND, lower extremity edema, claudication, dizziness, presyncope, syncope, bleeding, or neurologic sequela. The patient is tolerating medications without difficulties.  ? ? ?Past Medical History:  ?Diagnosis Date  ? Acute diastolic heart failure (HCC) 11/15/2020  ? Aneurysm of ascending aorta (HCC) 11/24/2020  ? Complete heart block (HCC) 09/16/2020  ? Dilated aortic root (HCC) 11/24/2020  ? Hypercholesteremia   ? Hyperlipidemia 11/24/2020  ? Hypersomnia 08/10/2014  ? Hypertension   ? Hyponatremia 11/24/2020  ? Obstructive sleep apnea (adult) (pediatric) 08/10/2014  ? Paroxysmal atrial fibrillation (HCC)   ? Pericardial effusion 11/24/2020  ? Pleural effusion   ? Presence of permanent cardiac pacemaker 09/17/2020  ? Second degree AV block   ? Snoring 08/10/2014  ? St Jude Medical Assurity MRI conditional  dual-chamber pacemaker for symptomatic complete heart block   09/24/2020  ? ?Past Surgical History:  ?Procedure Laterality Date  ? BIOPSY PROSTATE    ? COLONOSCOPY    ? PACEMAKER IMPLANT N/A 09/17/2020  ? SJM Assurity DR implanted by Dr Johney Frame for AV block and syncope  ? ? ? ?Current Outpatient Medications   ?Medication Sig Dispense Refill  ? acetaminophen (TYLENOL) 325 MG tablet Take 325-650 mg by mouth daily as needed for headache (pain).    ? ascorbic acid (VITAMIN C) 500 MG tablet Take 500 mg by mouth daily after supper.    ? Cholecalciferol (VITAMIN D3) 50 MCG (2000 UT) TABS Take 2,000 Units by mouth daily after supper.    ? Coenzyme Q10 (COQ10) 50 MG CAPS Take 50 mg by mouth daily.    ? finasteride (PROSCAR) 5 MG tablet Take 5 mg by mouth daily after supper.    ? fish oil-omega-3 fatty acids 1000 MG capsule Take 3 g by mouth daily after supper.    ? furosemide (LASIX) 40 MG tablet Take 40 mg by mouth as needed for edema. Or weight gain of 3 pounds in 1 day or 5 pounds in 1 week    ? ibuprofen (ADVIL) 600 MG tablet Take 1 tablet (600 mg total) by mouth 3 (three) times daily. 30 tablet 0  ? lisinopril (ZESTRIL) 10 MG tablet Take 10 mg by mouth daily after supper.    ? loratadine (CLARITIN) 10 MG tablet Take 10 mg by mouth daily after supper.    ? Multiple Vitamin (MULTIVITAMIN WITH MINERALS) TABS tablet Take 1 tablet by mouth daily after supper.    ? pantoprazole (PROTONIX) 40 MG tablet Take 1 tablet by mouth daily.    ? rivaroxaban (XARELTO) 20 MG TABS tablet Take 1 tablet (20 mg total) by mouth daily with supper. 90 tablet 3  ? rosuvastatin (CRESTOR) 5 MG tablet Take 5 mg by mouth daily after supper.    ? TART CHERRY PO Take  30 mLs by mouth 2 (two) times daily with a meal.    ? ?No current facility-administered medications for this visit.  ? ? ?Allergies:   Patient has no known allergies.  ? ?Social History:  The patient  reports that he has never smoked. He has never used smokeless tobacco. He reports that he does not drink alcohol and does not use drugs.  ? ?Family History:  The patient's family history includes Hyperlipidemia in his father and mother; Hypertension in his father and mother; Other in his mother.  ? ? ?ROS:  Please see the history of present illness.   Otherwise, review of systems is positive for  none.   All other systems are reviewed and negative.  ? ? ?PHYSICAL EXAM: ?VS:  BP (!) 156/72   Pulse 65   Ht 5\' 7"  (1.702 m)   Wt 165 lb (74.8 kg)   SpO2 96%   BMI 25.84 kg/m?  , BMI Body mass index is 25.84 kg/m?. ?GEN: Well nourished, well developed, in no acute distress  ?HEENT: normal  ?Neck: no JVD, carotid bruits, or masses ?Cardiac: RRR; no murmurs, rubs, or gallops,no edema  ?Respiratory:  clear to auscultation bilaterally, normal work of breathing ?GI: soft, nontender, nondistended, + BS ?MS: no deformity or atrophy  ?Skin: warm and dry, device pocket is well healed ?Neuro:  Strength and sensation are intact ?Psych: euthymic mood, full affect ? ?EKG:  EKG is ordered today. ?Personal review of the ekg ordered shows A sense, V paced0 ? ?Device interrogation is reviewed today in detail.  See PaceArt for details. ? ? ?Recent Labs: ?06/18/2021: ALT 19; BUN 21; Creatinine, Ser 1.15; Hemoglobin 12.7; Platelets 182; Potassium 4.5; Sodium 136; TSH 4.610  ? ? ?Lipid Panel  ?   ?Component Value Date/Time  ? CHOL 117 06/18/2021 0956  ? TRIG 35 06/18/2021 0956  ? HDL 58 06/18/2021 0956  ? CHOLHDL 2.0 06/18/2021 0956  ? LDLCALC 49 06/18/2021 0956  ? ? ? ?Wt Readings from Last 3 Encounters:  ?03/09/22 165 lb (74.8 kg)  ?12/03/21 167 lb 12.8 oz (76.1 kg)  ?12/02/21 164 lb (74.4 kg)  ?  ? ? ?Other studies Reviewed: ?Additional studies/ records that were reviewed today include: TTE 01/06/21  ?Review of the above records today demonstrates:  ? 1. Left ventricular ejection fraction, by estimation, is 50 to 55%. The  ?left ventricle has low normal function. The left ventricle has no regional  ?wall motion abnormalities. There is severe concentric left ventricular  ?hypertrophy. Left ventricular  ?diastolic parameters are consistent with Grade II diastolic dysfunction  ?(pseudonormalization).  ? 2. Right ventricular systolic function is normal. The right ventricular  ?size is normal.  ? 3. The mitral valve is normal in  structure. No evidence of mitral valve  ?regurgitation. No evidence of mitral stenosis.  ? 4. Tricuspid valve regurgitation is moderate.  ? 5. The aortic valve is normal in structure. Aortic valve regurgitation is  ?mild. Mild aortic valve sclerosis is present, with no evidence of aortic  ?valve stenosis.  ? 6. Aortic dilatation noted. Aneurysm of the ascending aorta, measuring 44  ?mm. There is mild dilatation of the aortic root, measuring 38 mm.  ? 7. The inferior vena cava is normal in size with greater than 50%  ?respiratory variability, suggesting right atrial pressure of 3 mmHg.  ? ? ?ASSESSMENT AND PLAN: ? ?1.  Complete heart block: [facility].  Device functioning appropriately.  No changes at  this time. ? ?2.  Atrial fibrillation: Currently on Xarelto milligrams daily.  Occurred in the setting of a heart failure exacerbation.  Has had no atrial fibrillation since. ? ?3.  Hypertension: Currently well controlled now ? ?4.  Chronic diastolic heart failure: No obvious volume overload. ? ? ? ?Current medicines are reviewed at length with the patient today.   ?The patient does not have concerns regarding his medicines.  The following changes were made today:  none ? ?Labs/ tests ordered today include:  ?Orders Placed This Encounter  ?Procedures  ? EKG 12-Lead  ? ? ? ?Disposition:   FU with Shantice Menger 1 year ? ?Signed, ?Kaelynne Christley Jorja Loa, MD  ?03/09/2022 2:29 PM    ? ?[facility] ?[address] ?[address] ?[place] [address] ?[phone] (office) ?[phone]) ? ?

## 2022-03-09 NOTE — Patient Instructions (Signed)
Medication Instructions:  ?Your physician recommends that you continue on your current medications as directed. Please refer to the Current Medication list given to you today. ? ?*If you need a refill on your cardiac medications before your next appointment, please call your pharmacy* ? ? ?Lab Work: ?None ordered. ? ?If you have labs (blood work) drawn today and your tests are completely normal, you will receive your results only by: ?MyChart Message (if you have MyChart) OR ?A paper copy in the mail ?If you have any lab test that is abnormal or we need to change your treatment, we will call you to review the results. ? ? ?Testing/Procedures: ?None ordered. ? ? ? ?Follow-Up: ?At [facility], you and your health needs are our priority.  As part of our continuing mission to provide you with exceptional heart care, we have created designated Provider Care Teams.  These Care Teams include your primary Cardiologist (physician) and Advanced Practice Providers (APPs -  Physician Assistants and Nurse Practitioners) who all work together to provide you with the care you need, when you need it. ? ?We recommend signing up for the patient portal called "MyChart".  Sign up information is provided on this After Visit Summary.  MyChart is used to connect with patients for Virtual Visits (Telemedicine).  Patients are able to view lab/test results, encounter notes, upcoming appointments, etc.  Non-urgent messages can be sent to your provider as well.   ?To learn more about what you can do with MyChart, go to [email or web address].   ? ?Your next appointment:   ?12 month(s) ? ?The format for your next appointment:   ?In Person ? ?Provider:   ?Will Camnitz, MD{ ? ?Important Information About Sugar ? ? ? ? ?  ?

## 2022-03-11 ENCOUNTER — Other Ambulatory Visit: Payer: Self-pay | Admitting: Surgery

## 2022-03-11 DIAGNOSIS — R911 Solitary pulmonary nodule: Secondary | ICD-10-CM

## 2022-03-18 ENCOUNTER — Ambulatory Visit (INDEPENDENT_AMBULATORY_CARE_PROVIDER_SITE_OTHER): Payer: Medicare Other

## 2022-03-18 DIAGNOSIS — I441 Atrioventricular block, second degree: Secondary | ICD-10-CM | POA: Diagnosis not present

## 2022-03-18 LAB — CUP PACEART REMOTE DEVICE CHECK
Battery Remaining Longevity: 95 mo
Battery Remaining Percentage: 83 %
Battery Voltage: 3.01 V
Brady Statistic AP VP Percent: 50 %
Brady Statistic AP VS Percent: 1 %
Brady Statistic AS VP Percent: 46 %
Brady Statistic AS VS Percent: 3.4 %
Brady Statistic RA Percent Paced: 50 %
Brady Statistic RV Percent Paced: 96 %
Date Time Interrogation Session: 20230419040013
Implantable Lead Implant Date: 20211019
Implantable Lead Implant Date: 20211019
Implantable Lead Location: 753859
Implantable Lead Location: 753860
Implantable Pulse Generator Implant Date: 20211019
Lead Channel Impedance Value: 400 Ohm
Lead Channel Impedance Value: 440 Ohm
Lead Channel Pacing Threshold Amplitude: 0.625 V
Lead Channel Pacing Threshold Amplitude: 0.875 V
Lead Channel Pacing Threshold Pulse Width: 0.5 ms
Lead Channel Pacing Threshold Pulse Width: 0.5 ms
Lead Channel Sensing Intrinsic Amplitude: 4.9 mV
Lead Channel Sensing Intrinsic Amplitude: 6.8 mV
Lead Channel Setting Pacing Amplitude: 1.125
Lead Channel Setting Pacing Amplitude: 1.625
Lead Channel Setting Pacing Pulse Width: 0.5 ms
Lead Channel Setting Sensing Sensitivity: 4 mV
Pulse Gen Model: 2272
Pulse Gen Serial Number: 6220383

## 2022-03-24 DIAGNOSIS — G4733 Obstructive sleep apnea (adult) (pediatric): Secondary | ICD-10-CM | POA: Diagnosis not present

## 2022-04-03 NOTE — Progress Notes (Signed)
Remote pacemaker transmission.   

## 2022-04-15 DIAGNOSIS — H25813 Combined forms of age-related cataract, bilateral: Secondary | ICD-10-CM | POA: Diagnosis not present

## 2022-04-22 ENCOUNTER — Ambulatory Visit: Payer: Medicare Other | Admitting: Surgery

## 2022-04-22 ENCOUNTER — Other Ambulatory Visit: Payer: Medicare Other

## 2022-04-23 DIAGNOSIS — G4733 Obstructive sleep apnea (adult) (pediatric): Secondary | ICD-10-CM | POA: Diagnosis not present

## 2022-05-05 ENCOUNTER — Ambulatory Visit
Admission: RE | Admit: 2022-05-05 | Discharge: 2022-05-05 | Disposition: A | Discharge status: Home / Self Care | Payer: Medicare Other | Source: Ambulatory Visit | Attending: Surgery | Admitting: Surgery

## 2022-05-05 DIAGNOSIS — R911 Solitary pulmonary nodule: Secondary | ICD-10-CM

## 2022-05-05 DIAGNOSIS — I7121 Aneurysm of the ascending aorta, without rupture: Secondary | ICD-10-CM | POA: Diagnosis not present

## 2022-05-24 DIAGNOSIS — G4733 Obstructive sleep apnea (adult) (pediatric): Secondary | ICD-10-CM | POA: Diagnosis not present

## 2022-06-09 DIAGNOSIS — E559 Vitamin D deficiency, unspecified: Secondary | ICD-10-CM | POA: Diagnosis not present

## 2022-06-09 DIAGNOSIS — I480 Paroxysmal atrial fibrillation: Secondary | ICD-10-CM | POA: Diagnosis not present

## 2022-06-09 DIAGNOSIS — E785 Hyperlipidemia, unspecified: Secondary | ICD-10-CM | POA: Diagnosis not present

## 2022-06-09 DIAGNOSIS — Z950 Presence of cardiac pacemaker: Secondary | ICD-10-CM | POA: Diagnosis not present

## 2022-06-09 DIAGNOSIS — Z79899 Other long term (current) drug therapy: Secondary | ICD-10-CM | POA: Diagnosis not present

## 2022-06-09 DIAGNOSIS — R972 Elevated prostate specific antigen [PSA]: Secondary | ICD-10-CM | POA: Diagnosis not present

## 2022-06-09 DIAGNOSIS — I10 Essential (primary) hypertension: Secondary | ICD-10-CM | POA: Diagnosis not present

## 2022-06-10 ENCOUNTER — Ambulatory Visit: Payer: Medicare Other | Admitting: Surgery

## 2022-06-10 ENCOUNTER — Other Ambulatory Visit: Payer: Medicare Other

## 2022-06-10 ENCOUNTER — Encounter: Payer: Self-pay | Admitting: Surgery

## 2022-06-10 VITALS — BP 148/70 | HR 73 | Resp 20 | Ht 67.0 in | Wt 163.0 lb

## 2022-06-10 DIAGNOSIS — I7121 Aneurysm of the ascending aorta, without rupture: Secondary | ICD-10-CM

## 2022-06-10 NOTE — Progress Notes (Signed)
HPI: The patient is a 76 year old gentleman who I saw in January 2023 with a stable 4.5 cm fusiform ascending aortic aneurysm.  He was also noted to have new part solid and part groundglass pulmonary nodules in the right lower lobe that were noticed likely infectious or inflammatory but follow-up scanning in 3 to 6 months was recommended by radiology.  He continues to feel well without chest pain or shortness of breath.  Current Outpatient Medications  Medication Sig Dispense Refill   acetaminophen (TYLENOL) 325 MG tablet Take 325-650 mg by mouth daily as needed for headache (pain).     ascorbic acid (VITAMIN C) 500 MG tablet Take 500 mg by mouth daily after supper.     Cholecalciferol (VITAMIN D3) 50 MCG (2000 UT) TABS Take 2,000 Units by mouth daily after supper.     Coenzyme Q10 (COQ10) 50 MG CAPS Take 50 mg by mouth daily.     finasteride (PROSCAR) 5 MG tablet Take 5 mg by mouth daily after supper.     fish oil-omega-3 fatty acids 1000 MG capsule Take 3 g by mouth daily after supper.     furosemide (LASIX) 40 MG tablet Take 40 mg by mouth as needed for edema. Or weight gain of 3 pounds in 1 day or 5 pounds in 1 week     ibuprofen (ADVIL) 600 MG tablet Take 1 tablet (600 mg total) by mouth 3 (three) times daily. 30 tablet 0   lisinopril (ZESTRIL) 10 MG tablet Take 10 mg by mouth daily after supper.     loratadine (CLARITIN) 10 MG tablet Take 10 mg by mouth daily after supper.     Multiple Vitamin (MULTIVITAMIN WITH MINERALS) TABS tablet Take 1 tablet by mouth daily after supper.     pantoprazole (PROTONIX) 40 MG tablet Take 1 tablet by mouth daily.     rivaroxaban (XARELTO) 20 MG TABS tablet Take 1 tablet (20 mg total) by mouth daily with supper. 90 tablet 3   rosuvastatin (CRESTOR) 5 MG tablet Take 5 mg by mouth daily after supper.     TART CHERRY PO Take 30 mLs by mouth 2 (two) times daily with a meal.     No current facility-administered medications for this visit.     Physical  Exam: BP (!) 148/70   Pulse 73   Resp 20   Ht 5\' 7"  (1.702 m)   Wt 163 lb (73.9 kg)   SpO2 96% Comment: RA  BMI 25.53 kg/m  He looks well. Cardiac exam shows regular rate and rhythm with a 2/6 systolic murmur along the left lower sternal border. Lungs are clear. There is no peripheral edema.  Diagnostic Tests:  Narrative & Impression  CLINICAL DATA:  Followup pulmonary nodule.   EXAM: CT CHEST WITHOUT CONTRAST   TECHNIQUE: Multidetector CT imaging of the chest was performed following the standard protocol without IV contrast.   RADIATION DOSE REDUCTION: This exam was performed according to the departmental dose-optimization program which includes automated exposure control, adjustment of the mA and/or kV according to patient size and/or use of iterative reconstruction technique.   COMPARISON:  Chest CT 11/26/2021   FINDINGS: Cardiovascular: The heart is normal in size. No pericardial effusion. Stable fusiform aneurysmal dilatation of the ascending thoracic aorta with maximum measurement of 4.4 cm. Recommend annual imaging followup by CTA or MRA. This recommendation follows 2010 ACCF/AHA/AATS/ACR/ASA/SCA/SCAI/SIR/STS/SVM Guidelines for the Diagnosis and Management of Patients with Thoracic Aortic Disease. Circulation. 2010; 1212011. Aortic aneurysm NOS (  ICD10-I71.9).   Stable aortic and coronary artery calcifications. The pacer wires are stable.   Mediastinum/Nodes: No mediastinal or hilar mass or lymphadenopathy. Small scattered lymph nodes are stable. Stable cluster of calcified subcarinal lymph nodes. The esophagus is grossly normal.   Lungs/Pleura: Stable right middle lobe calcified granulomas. Interval resolution of the right lower lobe ground-glass opacities which were likely inflammatory change. No new pulmonary lesions or acute pulmonary findings. No pleural effusions or pleural nodules.   Upper Abdomen: No significant upper abdominal findings.  Stable scattered calcified granulomas in the liver and spleen. Stable aortic and branch vessel calcifications.   Musculoskeletal: No chest wall mass, supraclavicular or axillary adenopathy. The bony thorax is intact. No worrisome bone lesions. Stable degenerative changes involving both shoulders and the thoracic spine.   IMPRESSION: 1. Stable fusiform aneurysmal dilatation of the ascending thoracic aorta with maximum measurement of 4.4 cm, as above. 2. Interval resolution of the right lower lobe ground-glass opacities which were likely inflammatory. 3. No new pulmonary lesions or acute pulmonary findings. 4. No mediastinal or hilar mass or adenopathy. 5. Stable calcified granulomas in the right middle lobe, subcarinal mediastinum, liver and spleen. 6. Aortic atherosclerosis.   Aortic Atherosclerosis (ICD10-I70.0).     Electronically Signed   By: Rudie Meyer M.D.   On: 05/05/2022 09:26    Impression: The right lower lobe pulmonary opacities have resolved and were likely inflammatory.  There were no new pulmonary lesions.  The ascending aortic aneurysm is stable at 4.4 cm.  I reviewed the CTA images with him and answered his questions.  Plan:  He will return to see me in 1 year with a CT scan of the chest without contrast.   I spent 15 minutes performing this established patient evaluation and > 50% of this time was spent face to face counseling and coordinating the care of this patient's aortic aneurysm and pulmonary nodules.   Alleen Borne, MD [facility] [phone]

## 2022-06-17 ENCOUNTER — Ambulatory Visit (INDEPENDENT_AMBULATORY_CARE_PROVIDER_SITE_OTHER): Payer: Medicare Other

## 2022-06-17 DIAGNOSIS — I441 Atrioventricular block, second degree: Secondary | ICD-10-CM | POA: Diagnosis not present

## 2022-06-18 LAB — CUP PACEART REMOTE DEVICE CHECK
Battery Remaining Longevity: 91 mo
Battery Remaining Percentage: 80 %
Battery Voltage: 3.01 V
Brady Statistic AP VP Percent: 55 %
Brady Statistic AP VS Percent: 1.6 %
Brady Statistic AS VP Percent: 32 %
Brady Statistic AS VS Percent: 11 %
Brady Statistic RA Percent Paced: 56 %
Brady Statistic RV Percent Paced: 87 %
Date Time Interrogation Session: 20230719040013
Implantable Lead Implant Date: 20211019
Implantable Lead Implant Date: 20211019
Implantable Lead Location: 753859
Implantable Lead Location: 753860
Implantable Pulse Generator Implant Date: 20211019
Lead Channel Impedance Value: 390 Ohm
Lead Channel Impedance Value: 450 Ohm
Lead Channel Pacing Threshold Amplitude: 0.625 V
Lead Channel Pacing Threshold Amplitude: 0.875 V
Lead Channel Pacing Threshold Pulse Width: 0.5 ms
Lead Channel Pacing Threshold Pulse Width: 0.5 ms
Lead Channel Sensing Intrinsic Amplitude: 5 mV
Lead Channel Sensing Intrinsic Amplitude: 6.8 mV
Lead Channel Setting Pacing Amplitude: 1.125
Lead Channel Setting Pacing Amplitude: 1.625
Lead Channel Setting Pacing Pulse Width: 0.5 ms
Lead Channel Setting Sensing Sensitivity: 4 mV
Pulse Gen Model: 2272
Pulse Gen Serial Number: 6220383

## 2022-06-22 DIAGNOSIS — G4733 Obstructive sleep apnea (adult) (pediatric): Secondary | ICD-10-CM | POA: Diagnosis not present

## 2022-07-13 NOTE — Progress Notes (Signed)
Remote pacemaker transmission.   

## 2022-07-15 DIAGNOSIS — N401 Enlarged prostate with lower urinary tract symptoms: Secondary | ICD-10-CM | POA: Diagnosis not present

## 2022-07-15 DIAGNOSIS — R972 Elevated prostate specific antigen [PSA]: Secondary | ICD-10-CM | POA: Diagnosis not present

## 2022-07-23 DIAGNOSIS — G4733 Obstructive sleep apnea (adult) (pediatric): Secondary | ICD-10-CM | POA: Diagnosis not present

## 2022-07-24 DIAGNOSIS — G4733 Obstructive sleep apnea (adult) (pediatric): Secondary | ICD-10-CM | POA: Diagnosis not present

## 2022-08-12 DIAGNOSIS — Z0000 Encounter for general adult medical examination without abnormal findings: Secondary | ICD-10-CM | POA: Diagnosis not present

## 2022-08-12 DIAGNOSIS — Z1331 Encounter for screening for depression: Secondary | ICD-10-CM | POA: Diagnosis not present

## 2022-08-12 DIAGNOSIS — E785 Hyperlipidemia, unspecified: Secondary | ICD-10-CM | POA: Diagnosis not present

## 2022-08-23 DIAGNOSIS — G4733 Obstructive sleep apnea (adult) (pediatric): Secondary | ICD-10-CM | POA: Diagnosis not present

## 2022-09-10 DIAGNOSIS — E785 Hyperlipidemia, unspecified: Secondary | ICD-10-CM | POA: Diagnosis not present

## 2022-09-10 DIAGNOSIS — I10 Essential (primary) hypertension: Secondary | ICD-10-CM | POA: Diagnosis not present

## 2022-09-10 DIAGNOSIS — Z950 Presence of cardiac pacemaker: Secondary | ICD-10-CM | POA: Diagnosis not present

## 2022-09-10 DIAGNOSIS — I480 Paroxysmal atrial fibrillation: Secondary | ICD-10-CM | POA: Diagnosis not present

## 2022-09-16 ENCOUNTER — Ambulatory Visit (INDEPENDENT_AMBULATORY_CARE_PROVIDER_SITE_OTHER): Payer: Medicare Other

## 2022-09-16 DIAGNOSIS — I441 Atrioventricular block, second degree: Secondary | ICD-10-CM

## 2022-09-16 LAB — CUP PACEART REMOTE DEVICE CHECK
Battery Remaining Longevity: 91 mo
Battery Remaining Percentage: 78 %
Battery Voltage: 3.01 V
Brady Statistic AP VP Percent: 51 %
Brady Statistic AP VS Percent: 3.8 %
Brady Statistic AS VP Percent: 25 %
Brady Statistic AS VS Percent: 20 %
Brady Statistic RA Percent Paced: 54 %
Brady Statistic RV Percent Paced: 76 %
Date Time Interrogation Session: 20231018040013
Implantable Lead Implant Date: 20211019
Implantable Lead Implant Date: 20211019
Implantable Lead Location: 753859
Implantable Lead Location: 753860
Implantable Pulse Generator Implant Date: 20211019
Lead Channel Impedance Value: 400 Ohm
Lead Channel Impedance Value: 490 Ohm
Lead Channel Pacing Threshold Amplitude: 0.625 V
Lead Channel Pacing Threshold Amplitude: 1 V
Lead Channel Pacing Threshold Pulse Width: 0.5 ms
Lead Channel Pacing Threshold Pulse Width: 0.5 ms
Lead Channel Sensing Intrinsic Amplitude: 5 mV
Lead Channel Sensing Intrinsic Amplitude: 7.5 mV
Lead Channel Setting Pacing Amplitude: 1.25 V
Lead Channel Setting Pacing Amplitude: 1.625
Lead Channel Setting Pacing Pulse Width: 0.5 ms
Lead Channel Setting Sensing Sensitivity: 4 mV
Pulse Gen Model: 2272
Pulse Gen Serial Number: 6220383

## 2022-09-21 DIAGNOSIS — G4733 Obstructive sleep apnea (adult) (pediatric): Secondary | ICD-10-CM | POA: Diagnosis not present

## 2022-09-29 NOTE — Progress Notes (Signed)
Remote pacemaker transmission.   

## 2022-10-22 DIAGNOSIS — G4733 Obstructive sleep apnea (adult) (pediatric): Secondary | ICD-10-CM | POA: Diagnosis not present

## 2022-11-21 DIAGNOSIS — G4733 Obstructive sleep apnea (adult) (pediatric): Secondary | ICD-10-CM | POA: Diagnosis not present

## 2022-12-09 ENCOUNTER — Ambulatory Visit: Payer: Medicare Other | Attending: Cardiology | Admitting: Cardiology

## 2022-12-09 ENCOUNTER — Encounter: Payer: Self-pay | Admitting: Cardiology

## 2022-12-09 VITALS — BP 138/60 | HR 62 | Ht 67.0 in | Wt 164.0 lb

## 2022-12-09 DIAGNOSIS — I7121 Aneurysm of the ascending aorta, without rupture: Secondary | ICD-10-CM

## 2022-12-09 DIAGNOSIS — G4733 Obstructive sleep apnea (adult) (pediatric): Secondary | ICD-10-CM

## 2022-12-09 DIAGNOSIS — E782 Mixed hyperlipidemia: Secondary | ICD-10-CM

## 2022-12-09 DIAGNOSIS — I480 Paroxysmal atrial fibrillation: Secondary | ICD-10-CM | POA: Diagnosis not present

## 2022-12-09 DIAGNOSIS — I442 Atrioventricular block, complete: Secondary | ICD-10-CM | POA: Diagnosis not present

## 2022-12-09 DIAGNOSIS — Z950 Presence of cardiac pacemaker: Secondary | ICD-10-CM

## 2022-12-09 MED ORDER — RIVAROXABAN 20 MG PO TABS: 20.0000 mg | ORAL_TABLET | Freq: Every day | ORAL | 3 refills | Status: DC

## 2022-12-09 NOTE — Progress Notes (Signed)
Cardiology Office Note:    Date:  12/09/2022   ID:  Arby Barrette, DOB 1946-10-20, MRN [id number]  PCP:  Nicholos Johns, MD  Cardiologist:  Jenean Lindau, MD   Referring MD: Nicholos Johns, MD    ASSESSMENT:    1. Aneurysm of ascending aorta without rupture ([place])   2. Complete heart block (HCC)   3. Paroxysmal atrial fibrillation ([place])   4. Obstructive sleep apnea (adult) (pediatric)   5. Mixed hyperlipidemia   6. St Jude Medical Assurity MRI conditional  dual-chamber pacemaker for symptomatic complete heart block      PLAN:    In order of problems listed above:  Primary prevention stressed with the patient.  Importance of compliance with diet medication stressed and vocalized understanding.  He was advised to walk at least half an hour a day 5 days a week and he promises to do so. Essential hypertension: Blood pressure stable and diet was emphasized.  He brings blood pressure readings from home which are excellent. Ascending aortic aneurysm: Followed by our cardiothoracic surgical colleagues and he gets a CT scan from them on an annual basis.  I reviewed the notes and discussed with the patient at length. Paroxysmal atrial fib:I discussed with the patient atrial fibrillation, disease process. Management and therapy including rate and rhythm control, anticoagulation benefits and potential risks were discussed extensively with the patient. Patient had multiple questions which were answered to patient's satisfaction. Mixed dyslipidemia: On lipid-lowering medications.  Lipids reviewed followed by primary care.  Diet emphasized. Patient will be seen in follow-up appointment in 6 months or earlier if the patient has any concerns    Medication Adjustments/Labs and Tests Ordered: Current medicines are reviewed at length with the patient today.  Concerns regarding medicines are outlined above.  No orders of the defined types were placed in this encounter.  No orders of the defined types  were placed in this encounter.    No chief complaint on file.    History of Present Illness:    Naftali Carchi is a 77 y.o. male.  Patient has past medical history of complete heart block post permanent pacemaker, essential hypertension, mixed dyslipidemia, paroxysmal atrial fibrillation and sleep apnea.  He denies any problems at this time and takes care of activities of daily living.  No chest pain orthopnea or PND.  At the time of my evaluation, the patient is alert awake oriented and in no distress.  Past Medical History:  Diagnosis Date   Acute diastolic heart failure ([place]) 11/15/2020   Aneurysm of ascending aorta ([place]) 11/24/2020   Complete heart block ([place]) 09/16/2020   Dilated aortic root ([place]) 11/24/2020   Hypercholesteremia    Hyperlipidemia 11/24/2020   Hypersomnia 08/10/2014   Hypertension    Hyponatremia 11/24/2020   Obstructive sleep apnea (adult) (pediatric) 08/10/2014   Paroxysmal atrial fibrillation (HCC)    Pericardial effusion 11/24/2020   Pleural effusion    Presence of permanent cardiac pacemaker 09/17/2020   Second degree AV block    Snoring 08/10/2014   St Jude Medical Assurity MRI conditional  dual-chamber pacemaker for symptomatic complete heart block   09/24/2020    Past Surgical History:  Procedure Laterality Date   BIOPSY PROSTATE     COLONOSCOPY     PACEMAKER IMPLANT N/A 09/17/2020   SJM Assurity DR implanted by Dr Rayann Heman for AV block and syncope    Current Medications: Current Meds  Medication Sig   acetaminophen (TYLENOL) 325 MG tablet Take 325-650 mg by  mouth daily as needed for headache (pain).   ascorbic acid (VITAMIN C) 500 MG tablet Take 500 mg by mouth daily after supper.   Cholecalciferol (VITAMIN D3) 50 MCG (2000 UT) TABS Take 2,000 Units by mouth daily after supper.   Coenzyme Q10 (COQ10) 50 MG CAPS Take 50 mg by mouth daily.   finasteride (PROSCAR) 5 MG tablet Take 5 mg by mouth daily after supper.   fish oil-omega-3 fatty acids  1000 MG capsule Take 3 g by mouth daily after supper.   furosemide (LASIX) 40 MG tablet Take 40 mg by mouth as needed for edema. Or weight gain of 3 pounds in 1 day or 5 pounds in 1 week   ibuprofen (ADVIL) 600 MG tablet Take 1 tablet (600 mg total) by mouth 3 (three) times daily.   lisinopril (ZESTRIL) 10 MG tablet Take 10 mg by mouth daily after supper.   loratadine (CLARITIN) 10 MG tablet Take 10 mg by mouth daily after supper.   Multiple Vitamin (MULTIVITAMIN WITH MINERALS) TABS tablet Take 1 tablet by mouth daily after supper.   pantoprazole (PROTONIX) 40 MG tablet Take 1 tablet by mouth daily.   rivaroxaban (XARELTO) 20 MG TABS tablet Take 1 tablet (20 mg total) by mouth daily with supper.   rosuvastatin (CRESTOR) 5 MG tablet Take 5 mg by mouth daily after supper.   TART CHERRY PO Take 30 mLs by mouth 2 (two) times daily with a meal.     Allergies:   Patient has no known allergies.   Social History   Socioeconomic History   Marital status: Married    Spouse name: Not on file   Number of children: 3   Years of education: Not on file   Highest education level: Not on file  Occupational History   Not on file  Tobacco Use   Smoking status: Never   Smokeless tobacco: Never  Vaping Use   Vaping Use: Never used  Substance and Sexual Activity   Alcohol use: Never   Drug use: Never   Sexual activity: Not on file  Other Topics Concern   Not on file  Social History Narrative   Not on file   Social Determinants of Health   Financial Resource Strain: Not on file  Food Insecurity: Not on file  Transportation Needs: Not on file  Physical Activity: Not on file  Stress: Not on file  Social Connections: Not on file     Family History: The patient's family history includes Hyperlipidemia in his father and mother; Hypertension in his father and mother; Other in his mother.  ROS:   Please see the history of present illness.    All other systems reviewed and are  negative.  EKGs/Labs/Other Studies Reviewed:    The following studies were reviewed today: I discussed my findings with the patient at length.   Recent Labs: No results found for requested labs within last 365 days.  Recent Lipid Panel    Component Value Date/Time   CHOL 117 06/18/2021 0956   TRIG 35 06/18/2021 0956   HDL 58 06/18/2021 0956   CHOLHDL 2.0 06/18/2021 0956   LDLCALC 49 06/18/2021 0956    Physical Exam:    VS:  BP 138/60   Pulse 62   Ht 5\' 7"  (1.702 m)   Wt 164 lb (74.4 kg)   SpO2 99%   BMI 25.69 kg/m     Wt Readings from Last 3 Encounters:  12/09/22 164 lb (74.4 kg)  06/10/22  163 lb (73.9 kg)  03/09/22 165 lb (74.8 kg)     GEN: Patient is in no acute distress HEENT: Normal NECK: No JVD; No carotid bruits LYMPHATICS: No lymphadenopathy CARDIAC: Hear sounds regular, 2/6 systolic murmur at the apex. RESPIRATORY:  Clear to auscultation without rales, wheezing or rhonchi  ABDOMEN: Soft, non-tender, non-distended MUSCULOSKELETAL:  No edema; No deformity  SKIN: Warm and dry NEUROLOGIC:  Alert and oriented x 3 PSYCHIATRIC:  Normal affect   Signed, Garwin Brothers, MD  12/09/2022 11:27 AM    [facility]

## 2022-12-09 NOTE — Patient Instructions (Signed)
Medication Instructions:  Your physician recommends that you continue on your current medications as directed. Please refer to the Current Medication list given to you today.  *If you need a refill on your cardiac medications before your next appointment, please call your pharmacy*   Lab Work: None ordered If you have labs (blood work) drawn today and your tests are completely normal, you will receive your results only by: MyChart Message (if you have MyChart) OR A paper copy in the mail If you have any lab test that is abnormal or we need to change your treatment, we will call you to review the results.   Testing/Procedures: None ordered   Follow-Up: At [facility], you and your health needs are our priority.  As part of our continuing mission to provide you with exceptional heart care, we have created designated Provider Care Teams.  These Care Teams include your primary Cardiologist (physician) and Advanced Practice Providers (APPs -  Physician Assistants and Nurse Practitioners) who all work together to provide you with the care you need, when you need it.  We recommend signing up for the patient portal called "MyChart".  Sign up information is provided on this After Visit Summary.  MyChart is used to connect with patients for Virtual Visits (Telemedicine).  Patients are able to view lab/test results, encounter notes, upcoming appointments, etc.  Non-urgent messages can be sent to your provider as well.   To learn more about what you can do with MyChart, go to [email or web address].    Your next appointment:   9 month(s)  The format for your next appointment:   In Person  Provider:   Rajan Revankar, MD    Other Instructions none  Important Information About Sugar       

## 2022-12-16 ENCOUNTER — Ambulatory Visit: Payer: Medicare Other | Attending: Cardiology

## 2022-12-16 DIAGNOSIS — I441 Atrioventricular block, second degree: Secondary | ICD-10-CM

## 2022-12-17 LAB — CUP PACEART REMOTE DEVICE CHECK
Battery Remaining Longevity: 86 mo
Battery Remaining Percentage: 75 %
Battery Voltage: 3.01 V
Brady Statistic AP VP Percent: 50 %
Brady Statistic AP VS Percent: 3.9 %
Brady Statistic AS VP Percent: 29 %
Brady Statistic AS VS Percent: 17 %
Brady Statistic RA Percent Paced: 53 %
Brady Statistic RV Percent Paced: 79 %
Date Time Interrogation Session: 20240117040017
Implantable Lead Connection Status: 753985
Implantable Lead Connection Status: 753985
Implantable Lead Implant Date: 20211019
Implantable Lead Implant Date: 20211019
Implantable Lead Location: 753859
Implantable Lead Location: 753860
Implantable Pulse Generator Implant Date: 20211019
Lead Channel Impedance Value: 390 Ohm
Lead Channel Impedance Value: 450 Ohm
Lead Channel Pacing Threshold Amplitude: 0.625 V
Lead Channel Pacing Threshold Amplitude: 0.75 V
Lead Channel Pacing Threshold Pulse Width: 0.5 ms
Lead Channel Pacing Threshold Pulse Width: 0.5 ms
Lead Channel Sensing Intrinsic Amplitude: 5 mV
Lead Channel Sensing Intrinsic Amplitude: 8.1 mV
Lead Channel Setting Pacing Amplitude: 1 V
Lead Channel Setting Pacing Amplitude: 1.625
Lead Channel Setting Pacing Pulse Width: 0.5 ms
Lead Channel Setting Sensing Sensitivity: 4 mV
Pulse Gen Model: 2272
Pulse Gen Serial Number: 6220383

## 2022-12-20 DIAGNOSIS — G4733 Obstructive sleep apnea (adult) (pediatric): Secondary | ICD-10-CM | POA: Diagnosis not present

## 2022-12-24 DIAGNOSIS — Z950 Presence of cardiac pacemaker: Secondary | ICD-10-CM | POA: Diagnosis not present

## 2022-12-24 DIAGNOSIS — Z79899 Other long term (current) drug therapy: Secondary | ICD-10-CM | POA: Diagnosis not present

## 2022-12-24 DIAGNOSIS — E785 Hyperlipidemia, unspecified: Secondary | ICD-10-CM | POA: Diagnosis not present

## 2022-12-24 DIAGNOSIS — I10 Essential (primary) hypertension: Secondary | ICD-10-CM | POA: Diagnosis not present

## 2022-12-24 DIAGNOSIS — I480 Paroxysmal atrial fibrillation: Secondary | ICD-10-CM | POA: Diagnosis not present

## 2022-12-24 DIAGNOSIS — E559 Vitamin D deficiency, unspecified: Secondary | ICD-10-CM | POA: Diagnosis not present

## 2022-12-26 DIAGNOSIS — G4733 Obstructive sleep apnea (adult) (pediatric): Secondary | ICD-10-CM | POA: Diagnosis not present

## 2023-01-07 ENCOUNTER — Encounter (HOSPITAL_COMMUNITY): Payer: Self-pay | Admitting: *Deleted

## 2023-01-07 NOTE — Progress Notes (Signed)
Remote pacemaker transmission.   

## 2023-01-18 DIAGNOSIS — N401 Enlarged prostate with lower urinary tract symptoms: Secondary | ICD-10-CM | POA: Diagnosis not present

## 2023-01-18 DIAGNOSIS — R972 Elevated prostate specific antigen [PSA]: Secondary | ICD-10-CM | POA: Diagnosis not present

## 2023-01-18 DIAGNOSIS — R3129 Other microscopic hematuria: Secondary | ICD-10-CM | POA: Diagnosis not present

## 2023-01-18 DIAGNOSIS — N390 Urinary tract infection, site not specified: Secondary | ICD-10-CM | POA: Diagnosis not present

## 2023-01-25 ENCOUNTER — Telehealth: Payer: Self-pay | Admitting: Physician Assistant

## 2023-01-25 NOTE — Telephone Encounter (Signed)
Pt c/o medication issue:  1. Name of Medication: Xarelto  2. How are you currently taking this medication (dosage and times per day)?  1 time a day  3. Are you having a reaction (difficulty breathing--STAT)?   4. What is your medication issue? His doctor found blood in his urine- he said had more information to give you

## 2023-01-25 NOTE — Telephone Encounter (Signed)
Pt called wanting to "keep you in the loop" about his findings. He stated that he saw Dr. Geraldo Pitter, Followed up with Dr. Rica Records and with Dr. Laray Anger. Dr. Laray Anger said he had blood in his urine and is doing tests. He will have an Abd CT 01-26-23. Marland Kitchen

## 2023-01-26 DIAGNOSIS — R3129 Other microscopic hematuria: Secondary | ICD-10-CM | POA: Diagnosis not present

## 2023-01-26 DIAGNOSIS — R319 Hematuria, unspecified: Secondary | ICD-10-CM | POA: Diagnosis not present

## 2023-01-28 ENCOUNTER — Other Ambulatory Visit (HOSPITAL_COMMUNITY): Payer: Self-pay | Admitting: Family Medicine

## 2023-01-28 DIAGNOSIS — R972 Elevated prostate specific antigen [PSA]: Secondary | ICD-10-CM

## 2023-02-23 DIAGNOSIS — N401 Enlarged prostate with lower urinary tract symptoms: Secondary | ICD-10-CM | POA: Diagnosis not present

## 2023-02-23 DIAGNOSIS — R3129 Other microscopic hematuria: Secondary | ICD-10-CM | POA: Diagnosis not present

## 2023-03-15 ENCOUNTER — Encounter: Payer: Medicare Other | Admitting: Cardiology

## 2023-03-17 ENCOUNTER — Ambulatory Visit (INDEPENDENT_AMBULATORY_CARE_PROVIDER_SITE_OTHER): Payer: Medicare Other

## 2023-03-17 DIAGNOSIS — I441 Atrioventricular block, second degree: Secondary | ICD-10-CM | POA: Diagnosis not present

## 2023-03-17 LAB — CUP PACEART REMOTE DEVICE CHECK
Battery Remaining Longevity: 83 mo
Battery Remaining Percentage: 72 %
Battery Voltage: 3.01 V
Brady Statistic AP VP Percent: 52 %
Brady Statistic AP VS Percent: 4.1 %
Brady Statistic AS VP Percent: 28 %
Brady Statistic AS VS Percent: 16 %
Brady Statistic RA Percent Paced: 56 %
Brady Statistic RV Percent Paced: 80 %
Date Time Interrogation Session: 20240417040013
Implantable Lead Connection Status: 753985
Implantable Lead Connection Status: 753985
Implantable Lead Implant Date: 20211019
Implantable Lead Implant Date: 20211019
Implantable Lead Location: 753859
Implantable Lead Location: 753860
Implantable Pulse Generator Implant Date: 20211019
Lead Channel Impedance Value: 390 Ohm
Lead Channel Impedance Value: 450 Ohm
Lead Channel Pacing Threshold Amplitude: 0.625 V
Lead Channel Pacing Threshold Amplitude: 0.875 V
Lead Channel Pacing Threshold Pulse Width: 0.5 ms
Lead Channel Pacing Threshold Pulse Width: 0.5 ms
Lead Channel Sensing Intrinsic Amplitude: 5 mV
Lead Channel Sensing Intrinsic Amplitude: 7.9 mV
Lead Channel Setting Pacing Amplitude: 1.125
Lead Channel Setting Pacing Amplitude: 1.625
Lead Channel Setting Pacing Pulse Width: 0.5 ms
Lead Channel Setting Sensing Sensitivity: 4 mV
Pulse Gen Model: 2272
Pulse Gen Serial Number: 6220383

## 2023-03-19 ENCOUNTER — Ambulatory Visit: Payer: Medicare Other | Attending: Cardiology | Admitting: Cardiology

## 2023-03-19 ENCOUNTER — Encounter: Payer: Self-pay | Admitting: Cardiology

## 2023-03-19 VITALS — BP 112/70 | HR 68 | Ht 67.0 in | Wt 163.0 lb

## 2023-03-19 DIAGNOSIS — D6869 Other thrombophilia: Secondary | ICD-10-CM | POA: Diagnosis not present

## 2023-03-19 DIAGNOSIS — I480 Paroxysmal atrial fibrillation: Secondary | ICD-10-CM | POA: Diagnosis not present

## 2023-03-19 DIAGNOSIS — I442 Atrioventricular block, complete: Secondary | ICD-10-CM | POA: Diagnosis not present

## 2023-03-19 DIAGNOSIS — I5032 Chronic diastolic (congestive) heart failure: Secondary | ICD-10-CM

## 2023-03-19 DIAGNOSIS — I10 Essential (primary) hypertension: Secondary | ICD-10-CM | POA: Diagnosis not present

## 2023-03-19 NOTE — Progress Notes (Signed)
Electrophysiology Office Note   Date:  03/19/2023   ID:  Ricky Hood, DOB 1946/11/19, MRN [id number]  PCP:  Lucianne Lei, MD  Cardiologist: Revankar Primary Electrophysiologist:  Marquette Blodgett Jorja Loa, MD    Chief Complaint: pacemaker   History of Present Illness: Ricky Hood is a 77 y.o. male who is being seen today for the evaluation of pacemkaer at the request of Lucianne Lei, MD. Presenting today for electrophysiology evaluation.  He has a history significant for diastolic heart failure, ascending aortic aneurysm, complete heart block, hypertension, hyperlipidemia, sleep apnea, atrial fibrillation.  He is post [employer] dual-chamber pacemaker implanted 09/24/2020.  Today, denies symptoms of palpitations, chest pain, shortness of breath, orthopnea, PND, lower extremity edema, claudication, dizziness, presyncope, syncope, bleeding, or neurologic sequela. The patient is tolerating medications without difficulties.      Past Medical History:  Diagnosis Date   Acute diastolic heart failure 11/15/2020   Aneurysm of ascending aorta 11/24/2020   Complete heart block 09/16/2020   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   Hypercholesteremia    Hyperlipidemia 11/24/2020   Hypersomnia 08/10/2014   Hypertension    Hyponatremia 11/24/2020   Obstructive sleep apnea (adult) (pediatric) 08/10/2014   Paroxysmal atrial fibrillation    Pericardial effusion 11/24/2020   Pleural effusion    Presence of permanent cardiac pacemaker 09/17/2020   Second degree AV block    Snoring 08/10/2014   St Jude Medical Assurity MRI conditional  dual-chamber pacemaker for symptomatic complete heart block   09/24/2020   Past Surgical History:  Procedure Laterality Date   BIOPSY PROSTATE     COLONOSCOPY     PACEMAKER IMPLANT N/A 09/17/2020   SJM Assurity DR implanted by Dr Johney Frame for AV block and syncope     Current Outpatient Medications  Medication Sig Dispense Refill   acetaminophen (TYLENOL) 325 MG  tablet Take 325-650 mg by mouth daily as needed for headache (pain).     ascorbic acid (VITAMIN C) 500 MG tablet Take 500 mg by mouth daily after supper.     Cholecalciferol (VITAMIN D3) 50 MCG (2000 UT) TABS Take 2,000 Units by mouth daily after supper.     Coenzyme Q10 (COQ10) 50 MG CAPS Take 50 mg by mouth daily.     finasteride (PROSCAR) 5 MG tablet Take 5 mg by mouth daily after supper.     fish oil-omega-3 fatty acids 1000 MG capsule Take 3 g by mouth daily after supper.     furosemide (LASIX) 40 MG tablet Take 40 mg by mouth as needed for edema. Or weight gain of 3 pounds in 1 day or 5 pounds in 1 week     ibuprofen (ADVIL) 600 MG tablet Take 1 tablet (600 mg total) by mouth 3 (three) times daily. 30 tablet 0   lisinopril (ZESTRIL) 10 MG tablet Take 10 mg by mouth daily after supper.     loratadine (CLARITIN) 10 MG tablet Take 10 mg by mouth daily after supper.     Multiple Vitamin (MULTIVITAMIN WITH MINERALS) TABS tablet Take 1 tablet by mouth daily after supper.     pantoprazole (PROTONIX) 40 MG tablet Take 1 tablet by mouth daily.     rivaroxaban (XARELTO) 20 MG TABS tablet Take 1 tablet (20 mg total) by mouth daily with supper. 90 tablet 3   rosuvastatin (CRESTOR) 5 MG tablet Take 5 mg by mouth daily after supper.     TART CHERRY PO Take 30 mLs by mouth 2 (two) times daily with a  meal.     No current facility-administered medications for this visit.    Allergies:   Patient has no known allergies.   Social History:  The patient  reports that he has never smoked. He has never used smokeless tobacco. He reports that he does not drink alcohol and does not use drugs.   Family History:  The patient's family history includes Hyperlipidemia in his father and mother; Hypertension in his father and mother; Other in his mother.    ROS:  Please see the history of present illness.   Otherwise, review of systems is positive for none.   All other systems are reviewed and negative.   PHYSICAL  EXAM: VS:  BP 112/70   Pulse 68   Ht  (1.702 m)   Wt 163 lb (73.9 kg)   BMI 25.53 kg/m  , BMI Body mass index is 25.53 kg/m. GEN: Well nourished, well developed, in no acute distress  HEENT: normal  Neck: no JVD, carotid bruits, or masses Cardiac: RRR; no murmurs, rubs, or gallops,no edema  Respiratory:  clear to auscultation bilaterally, normal work of breathing GI: soft, nontender, nondistended, + BS MS: no deformity or atrophy  Skin: warm and dry, device site well healed Neuro:  Strength and sensation are intact Psych: euthymic mood, full affect  EKG:  EKG is ordered today. Personal review of the ekg ordered shows sinus rhythm, 1dAVB, LBBB  Personal review of the device interrogation today. Results in Paceart    Recent Labs: No results found for requested labs within last 365 days.    Lipid Panel     Component Value Date/Time   CHOL 117 06/18/2021 0956   TRIG 35 06/18/2021 0956   HDL 58 06/18/2021 0956   CHOLHDL 2.0 06/18/2021 0956   LDLCALC 49 06/18/2021 0956     Wt Readings from Last 3 Encounters:  03/19/23 163 lb (73.9 kg)  12/09/22 164 lb (74.4 kg)  06/10/22 163 lb (73.9 kg)      Other studies Reviewed: Additional studies/ records that were reviewed today include: TTE 01/06/21  Review of the above records today demonstrates:   1. Left ventricular ejection fraction, by estimation, is 50 to 55%. The  left ventricle has low normal function. The left ventricle has no regional  wall motion abnormalities. There is severe concentric left ventricular  hypertrophy. Left ventricular  diastolic parameters are consistent with Grade II diastolic dysfunction  (pseudonormalization).   2. Right ventricular systolic function is normal. The right ventricular  size is normal.   3. The mitral valve is normal in structure. No evidence of mitral valve  regurgitation. No evidence of mitral stenosis.   4. Tricuspid valve regurgitation is moderate.   5. The aortic valve  is normal in structure. Aortic valve regurgitation is  mild. Mild aortic valve sclerosis is present, with no evidence of aortic  valve stenosis.   6. Aortic dilatation noted. Aneurysm of the ascending aorta, measuring 44  mm. There is mild dilatation of the aortic root, measuring 38 mm.   7. The inferior vena cava is normal in size with greater than 50%  respiratory variability, suggesting right atrial pressure of 3 mmHg.    ASSESSMENT AND PLAN:  1.  Complete heart block: Status post [facility] Jude dual-chamber pacemaker.  Device function appropriate.  No changes at this time.  Sensing, threshold, impedance within normal limits and stable.  He is conducting today.  Izella Ybanez turn on VIP.  2.  Paroxysmal atrial fibrillation: Currently  on Xarelto.  Occurred in the setting of heart failure exacerbation.  No further episodes.  No changes.  3.  Hypertension: Currently well-controlled  4.  Chronic diastolic heart failure: No obvious volume overload.  5.  Secondary to coagula state: Currently on Xarelto for atrial fibrillation.   Current medicines are reviewed at length with the patient today.   The patient does not have concerns regarding his medicines.  The following changes were made today: None  Labs/ tests ordered today include:  Orders Placed This Encounter  Procedures   EKG 12-Lead     Disposition:   FU with Ranny Wiebelhaus 1 year  Signed, Jaiyana Canale Jorja Loa, MD  03/19/2023 2:04 PM     [facility] [address] [place] [address] [phone] (office) [phone] (fax)

## 2023-03-19 NOTE — Patient Instructions (Signed)
Medication Instructions:  Your physician recommends that you continue on your current medications as directed. Please refer to the Current Medication list given to you today.  *If you need a refill on your cardiac medications before your next appointment, please call your pharmacy*   Lab Work: None ordered   Testing/Procedures: None ordered   Follow-Up: At [facility], you and your health needs are our priority.  As part of our continuing mission to provide you with exceptional heart care, we have created designated Provider Care Teams.  These Care Teams include your primary Cardiologist (physician) and Advanced Practice Providers (APPs -  Physician Assistants and Nurse Practitioners) who all work together to provide you with the care you need, when you need it.  Remote monitoring is used to monitor your Pacemaker or ICD from home. This monitoring reduces the number of office visits required to check your device to one time per year. It allows [place] to keep an eye on the functioning of your device to ensure it is working properly. You are scheduled for a device check from home on 06/16/23. You may send your transmission at any time that day. If you have a wireless device, the transmission will be sent automatically. After your physician reviews your transmission, you will receive a postcard with your next transmission date.  Your next appointment:   1 year(s)  The format for your next appointment:   In Person  Provider:   Loman Brooklyn, MD    Thank you for choosing [facility]!!   Dory Horn, RN [phone]

## 2023-03-21 DIAGNOSIS — G4733 Obstructive sleep apnea (adult) (pediatric): Secondary | ICD-10-CM | POA: Diagnosis not present

## 2023-03-22 DIAGNOSIS — K080 Exfoliation of teeth due to systemic causes: Secondary | ICD-10-CM | POA: Diagnosis not present

## 2023-03-25 DIAGNOSIS — I480 Paroxysmal atrial fibrillation: Secondary | ICD-10-CM | POA: Diagnosis not present

## 2023-03-25 DIAGNOSIS — E785 Hyperlipidemia, unspecified: Secondary | ICD-10-CM | POA: Diagnosis not present

## 2023-03-25 DIAGNOSIS — Z950 Presence of cardiac pacemaker: Secondary | ICD-10-CM | POA: Diagnosis not present

## 2023-03-25 DIAGNOSIS — R7989 Other specified abnormal findings of blood chemistry: Secondary | ICD-10-CM | POA: Diagnosis not present

## 2023-03-25 DIAGNOSIS — I10 Essential (primary) hypertension: Secondary | ICD-10-CM | POA: Diagnosis not present

## 2023-03-26 ENCOUNTER — Encounter (HOSPITAL_COMMUNITY): Payer: Self-pay

## 2023-03-26 ENCOUNTER — Ambulatory Visit (HOSPITAL_COMMUNITY): Admission: RE | Admit: 2023-03-26 | Payer: Medicare Other | Source: Ambulatory Visit

## 2023-03-27 DIAGNOSIS — G4733 Obstructive sleep apnea (adult) (pediatric): Secondary | ICD-10-CM | POA: Diagnosis not present

## 2023-04-19 NOTE — Progress Notes (Signed)
Remote pacemaker transmission.   

## 2023-04-20 DIAGNOSIS — G4733 Obstructive sleep apnea (adult) (pediatric): Secondary | ICD-10-CM | POA: Diagnosis not present

## 2023-04-21 DIAGNOSIS — H25813 Combined forms of age-related cataract, bilateral: Secondary | ICD-10-CM | POA: Diagnosis not present

## 2023-05-07 ENCOUNTER — Other Ambulatory Visit: Payer: Self-pay | Admitting: Surgery

## 2023-05-07 DIAGNOSIS — I7121 Aneurysm of the ascending aorta, without rupture: Secondary | ICD-10-CM

## 2023-05-21 ENCOUNTER — Ambulatory Visit (HOSPITAL_COMMUNITY)
Admission: RE | Admit: 2023-05-21 | Discharge: 2023-05-21 | Disposition: A | Discharge status: Home / Self Care | Payer: Medicare Other | Source: Ambulatory Visit | Attending: Family Medicine | Admitting: Family Medicine

## 2023-05-21 DIAGNOSIS — N400 Enlarged prostate without lower urinary tract symptoms: Secondary | ICD-10-CM | POA: Diagnosis not present

## 2023-05-21 DIAGNOSIS — R972 Elevated prostate specific antigen [PSA]: Secondary | ICD-10-CM | POA: Diagnosis not present

## 2023-05-21 DIAGNOSIS — G4733 Obstructive sleep apnea (adult) (pediatric): Secondary | ICD-10-CM | POA: Diagnosis not present

## 2023-05-21 MED ORDER — GADOBUTROL 1 MMOL/ML IV SOLN
8.0000 mL | Freq: Once | INTRAVENOUS | Status: AC | PRN
  Administered 2023-05-21: 8 mL via INTRAVENOUS

## 2023-06-16 ENCOUNTER — Ambulatory Visit (INDEPENDENT_AMBULATORY_CARE_PROVIDER_SITE_OTHER): Payer: Medicare Other

## 2023-06-16 DIAGNOSIS — I442 Atrioventricular block, complete: Secondary | ICD-10-CM | POA: Diagnosis not present

## 2023-06-17 LAB — CUP PACEART REMOTE DEVICE CHECK
Battery Remaining Longevity: 85 mo
Battery Remaining Percentage: 70 %
Battery Voltage: 3.01 V
Brady Statistic AP VP Percent: 17 %
Brady Statistic AP VS Percent: 43 %
Brady Statistic AS VP Percent: 8.3 %
Brady Statistic AS VS Percent: 32 %
Brady Statistic RA Percent Paced: 59 %
Brady Statistic RV Percent Paced: 25 %
Date Time Interrogation Session: 20240717040013
Implantable Lead Connection Status: 753985
Implantable Lead Connection Status: 753985
Implantable Lead Implant Date: 20211019
Implantable Lead Implant Date: 20211019
Implantable Lead Location: 753859
Implantable Lead Location: 753860
Implantable Pulse Generator Implant Date: 20211019
Lead Channel Impedance Value: 390 Ohm
Lead Channel Impedance Value: 440 Ohm
Lead Channel Pacing Threshold Amplitude: 0.625 V
Lead Channel Pacing Threshold Amplitude: 0.875 V
Lead Channel Pacing Threshold Pulse Width: 0.5 ms
Lead Channel Pacing Threshold Pulse Width: 0.5 ms
Lead Channel Sensing Intrinsic Amplitude: 5 mV
Lead Channel Sensing Intrinsic Amplitude: 8.7 mV
Lead Channel Setting Pacing Amplitude: 1.125
Lead Channel Setting Pacing Amplitude: 1.625
Lead Channel Setting Pacing Pulse Width: 0.5 ms
Lead Channel Setting Sensing Sensitivity: 4 mV
Pulse Gen Model: 2272
Pulse Gen Serial Number: 6220383

## 2023-06-19 DIAGNOSIS — G4733 Obstructive sleep apnea (adult) (pediatric): Secondary | ICD-10-CM | POA: Diagnosis not present

## 2023-06-26 DIAGNOSIS — G4733 Obstructive sleep apnea (adult) (pediatric): Secondary | ICD-10-CM | POA: Diagnosis not present

## 2023-06-30 ENCOUNTER — Ambulatory Visit
Admission: RE | Admit: 2023-06-30 | Discharge: 2023-06-30 | Disposition: A | Discharge status: Home / Self Care | Payer: Medicare Other | Source: Ambulatory Visit | Attending: Surgery | Admitting: Surgery

## 2023-06-30 ENCOUNTER — Encounter: Payer: Self-pay | Admitting: Surgery

## 2023-06-30 ENCOUNTER — Ambulatory Visit: Payer: Medicare Other | Admitting: Surgery

## 2023-06-30 VITALS — BP 150/77 | HR 76 | Resp 20 | Ht 67.0 in | Wt 163.0 lb

## 2023-06-30 DIAGNOSIS — I700 Atherosclerosis of aorta: Secondary | ICD-10-CM | POA: Diagnosis not present

## 2023-06-30 DIAGNOSIS — I7121 Aneurysm of the ascending aorta, without rupture: Secondary | ICD-10-CM

## 2023-06-30 DIAGNOSIS — R911 Solitary pulmonary nodule: Secondary | ICD-10-CM

## 2023-06-30 NOTE — Progress Notes (Signed)
     HPI: ***  Current Outpatient Medications  Medication Sig Dispense Refill   acetaminophen (TYLENOL) 325 MG tablet Take 325-650 mg by mouth daily as needed for headache (pain).     ascorbic acid (VITAMIN C) 500 MG tablet Take 500 mg by mouth daily after supper.     Cholecalciferol (VITAMIN D3) 50 MCG (2000 UT) TABS Take 2,000 Units by mouth daily after supper.     Coenzyme Q10 (COQ10) 50 MG CAPS Take 50 mg by mouth daily.     finasteride (PROSCAR) 5 MG tablet Take 5 mg by mouth daily after supper.     fish oil-omega-3 fatty acids 1000 MG capsule Take 3 g by mouth daily after supper.     furosemide (LASIX) 40 MG tablet Take 40 mg by mouth as needed for edema. Or weight gain of 3 pounds in 1 day or 5 pounds in 1 week     ibuprofen (ADVIL) 600 MG tablet Take 1 tablet (600 mg total) by mouth 3 (three) times daily. 30 tablet 0   lisinopril (ZESTRIL) 10 MG tablet Take 10 mg by mouth daily after supper.     loratadine (CLARITIN) 10 MG tablet Take 10 mg by mouth daily after supper.     Multiple Vitamin (MULTIVITAMIN WITH MINERALS) TABS tablet Take 1 tablet by mouth daily after supper.     pantoprazole (PROTONIX) 40 MG tablet Take 1 tablet by mouth daily.     rivaroxaban (XARELTO) 20 MG TABS tablet Take 1 tablet (20 mg total) by mouth daily with supper. 90 tablet 3   rosuvastatin (CRESTOR) 5 MG tablet Take 5 mg by mouth daily after supper.     TART CHERRY PO Take 30 mLs by mouth 2 (two) times daily with a meal.     No current facility-administered medications for this visit.     Physical Exam: ***  Diagnostic Tests: ***  Impression: ***  Plan: ***   Alleen Borne, MD [facility] [phone]

## 2023-07-01 NOTE — Progress Notes (Signed)
Remote pacemaker transmission.   

## 2023-07-06 DIAGNOSIS — Z950 Presence of cardiac pacemaker: Secondary | ICD-10-CM | POA: Diagnosis not present

## 2023-07-06 DIAGNOSIS — I10 Essential (primary) hypertension: Secondary | ICD-10-CM | POA: Diagnosis not present

## 2023-07-06 DIAGNOSIS — E785 Hyperlipidemia, unspecified: Secondary | ICD-10-CM | POA: Diagnosis not present

## 2023-07-06 DIAGNOSIS — I480 Paroxysmal atrial fibrillation: Secondary | ICD-10-CM | POA: Diagnosis not present

## 2023-07-15 DIAGNOSIS — U071 COVID-19: Secondary | ICD-10-CM | POA: Diagnosis not present

## 2023-07-15 DIAGNOSIS — Z6826 Body mass index (BMI) 26.0-26.9, adult: Secondary | ICD-10-CM | POA: Diagnosis not present

## 2023-07-20 DIAGNOSIS — G4733 Obstructive sleep apnea (adult) (pediatric): Secondary | ICD-10-CM | POA: Diagnosis not present

## 2023-07-30 DIAGNOSIS — G4733 Obstructive sleep apnea (adult) (pediatric): Secondary | ICD-10-CM | POA: Diagnosis not present

## 2023-08-18 DIAGNOSIS — Z0000 Encounter for general adult medical examination without abnormal findings: Secondary | ICD-10-CM | POA: Diagnosis not present

## 2023-08-18 DIAGNOSIS — Z9181 History of falling: Secondary | ICD-10-CM | POA: Diagnosis not present

## 2023-08-20 DIAGNOSIS — G4733 Obstructive sleep apnea (adult) (pediatric): Secondary | ICD-10-CM | POA: Diagnosis not present

## 2023-08-24 DIAGNOSIS — R3129 Other microscopic hematuria: Secondary | ICD-10-CM | POA: Diagnosis not present

## 2023-08-24 DIAGNOSIS — R972 Elevated prostate specific antigen [PSA]: Secondary | ICD-10-CM | POA: Diagnosis not present

## 2023-08-24 DIAGNOSIS — N401 Enlarged prostate with lower urinary tract symptoms: Secondary | ICD-10-CM | POA: Diagnosis not present

## 2023-09-02 ENCOUNTER — Other Ambulatory Visit: Payer: Self-pay

## 2023-09-09 ENCOUNTER — Ambulatory Visit: Payer: Medicare Other | Attending: Cardiology | Admitting: Cardiology

## 2023-09-09 ENCOUNTER — Encounter: Payer: Self-pay | Admitting: Cardiology

## 2023-09-09 VITALS — BP 130/74 | HR 62 | Ht 67.0 in | Wt 163.0 lb

## 2023-09-09 DIAGNOSIS — E782 Mixed hyperlipidemia: Secondary | ICD-10-CM

## 2023-09-09 DIAGNOSIS — I7121 Aneurysm of the ascending aorta, without rupture: Secondary | ICD-10-CM | POA: Diagnosis not present

## 2023-09-09 DIAGNOSIS — I442 Atrioventricular block, complete: Secondary | ICD-10-CM | POA: Diagnosis not present

## 2023-09-09 DIAGNOSIS — I480 Paroxysmal atrial fibrillation: Secondary | ICD-10-CM | POA: Diagnosis not present

## 2023-09-09 DIAGNOSIS — G4733 Obstructive sleep apnea (adult) (pediatric): Secondary | ICD-10-CM

## 2023-09-09 DIAGNOSIS — Z950 Presence of cardiac pacemaker: Secondary | ICD-10-CM

## 2023-09-09 NOTE — Patient Instructions (Signed)

## 2023-09-09 NOTE — Progress Notes (Signed)
Cardiology Office Note:    Date:  09/09/2023   ID:  Ricky Hood, DOB 1946-04-03, MRN [id number]  PCP:  Lucianne Lei, MD  Cardiologist:  Garwin Brothers, MD   Referring MD: Lucianne Lei, MD    ASSESSMENT:    1. Aneurysm of ascending aorta without rupture (HCC)   2. Complete heart block (HCC)   3. Paroxysmal atrial fibrillation (HCC)   4. Obstructive sleep apnea (adult) (pediatric)   5. Presence of permanent cardiac pacemaker   6. St Jude Medical Assurity MRI conditional  dual-chamber pacemaker for symptomatic complete heart block     7. Mixed hyperlipidemia    PLAN:    In order of problems listed above:  Primary prevention stressed with the patient.  Importance of compliance with diet medication stressed and patient verbalized standing. He was advised to walk at least half an hour a day 5 times a week and he promises to do so. Ascending aortic aneurysm.  Stable and followed by cardiothoracic colleagues.  I reviewed the notes and discussed with the patient. Mixed dyslipidemia: On lipid-lowering medications followed by primary care.  I reviewed lipids from [facility] sheet. Paroxysmal atrial fibrillation:I discussed with the patient atrial fibrillation, disease process. Management and therapy including rate and rhythm control, anticoagulation benefits and potential risks were discussed extensively with the patient. Patient had multiple questions which were answered to patient's satisfaction. Patient will be seen in follow-up appointment in 6 months or earlier if the patient has any concerns.    Medication Adjustments/Labs and Tests Ordered: Current medicines are reviewed at length with the patient today.  Concerns regarding medicines are outlined above.  No orders of the defined types were placed in this encounter.  No orders of the defined types were placed in this encounter.    No chief complaint on file.    History of Present Illness:    Ricky Hood is a 77 y.o. male.   Patient has aneurysm of the ascending aorta, complete heart block, mixed dyslipidemia and paroxysmal atrial fibrillation.  He denies any problems at this time and takes care of activities of daily living.  No chest pain orthopnea or PND.  At the time of my evaluation, the patient is alert awake oriented and in no distress.  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/10/2014   Hypertension    Hyponatremia 11/24/2020   Obstructive sleep apnea (adult) (pediatric) 08/10/2014   Paroxysmal atrial fibrillation (HCC)    Pericardial effusion 11/24/2020   Pleural effusion    Presence of permanent cardiac pacemaker 09/17/2020   Second degree AV block    Snoring 08/10/2014   St Jude Medical Assurity MRI conditional  dual-chamber pacemaker for symptomatic complete heart block   09/24/2020    Past Surgical History:  Procedure Laterality Date   BIOPSY PROSTATE     COLONOSCOPY     PACEMAKER IMPLANT N/A 09/17/2020   SJM Assurity DR implanted by Dr Johney Frame for AV block and syncope    Current Medications: Current Meds  Medication Sig   acetaminophen (TYLENOL) 325 MG tablet Take 325-650 mg by mouth daily as needed for headache (pain).   ascorbic acid (VITAMIN C) 500 MG tablet Take 500 mg by mouth daily after supper.   Cholecalciferol (VITAMIN D3) 50  MCG (2000 UT) TABS Take 2,000 Units by mouth daily after supper.   Coenzyme Q10 (COQ10) 50 MG CAPS Take 50 mg by mouth daily.   finasteride (PROSCAR) 5 MG tablet Take 5 mg by mouth daily after supper.   fish oil-omega-3 fatty acids 1000 MG capsule Take 3 g by mouth daily after supper.   furosemide (LASIX) 40 MG tablet Take 40 mg by mouth as needed for edema. Or weight gain of 3 pounds in 1 day or 5 pounds in 1 week    ibuprofen (ADVIL) 600 MG tablet Take 1 tablet (600 mg total) by mouth 3 (three) times daily.   lisinopril (ZESTRIL) 10 MG tablet Take 10 mg by mouth daily after supper.   loratadine (CLARITIN) 10 MG tablet Take 10 mg by mouth daily after supper.   Multiple Vitamin (MULTIVITAMIN WITH MINERALS) TABS tablet Take 1 tablet by mouth daily after supper.   rivaroxaban (XARELTO) 20 MG TABS tablet Take 1 tablet (20 mg total) by mouth daily with supper.   rosuvastatin (CRESTOR) 5 MG tablet Take 5 mg by mouth daily after supper.   TART CHERRY PO Take 30 mLs by mouth 2 (two) times daily with a meal.     Allergies:   Patient has no known allergies.   Social History   Socioeconomic History   Marital status: Married    Spouse name: Not on file   Number of children: 3   Years of education: Not on file   Highest education level: Not on file  Occupational History   Not on file  Tobacco Use   Smoking status: Never   Smokeless tobacco: Never  Vaping Use   Vaping status: Never Used  Substance and Sexual Activity   Alcohol use: Never   Drug use: Never   Sexual activity: Not on file  Other Topics Concern   Not on file  Social History Narrative   Not on file   Social Determinants of Health   Financial Resource Strain: Not on file  Food Insecurity: Not on file  Transportation Needs: Not on file  Physical Activity: Not on file  Stress: Not on file  Social Connections: Unknown (04/13/2022)   Received from [facility]   Social Network    Social Network: Not on file     Family History: The patient's family history includes Hyperlipidemia in his father and mother; Hypertension in his father and mother; Other in his mother.  ROS:   Please see the history of present illness.    All other systems reviewed and are negative.  EKGs/Labs/Other Studies Reviewed:    The following studies were reviewed today: I discussed my findings with the patient   Recent Labs: No results  found for requested labs within last 365 days.  Recent Lipid Panel    Component Value Date/Time   CHOL 117 06/18/2021 0956   TRIG 35 06/18/2021 0956   HDL 58 06/18/2021 0956   CHOLHDL 2.0 06/18/2021 0956   LDLCALC 49 06/18/2021 0956    Physical Exam:    VS:  BP 130/74   Pulse 62   Ht 5\' 7"  (1.702 m)   Wt 163 lb (73.9 kg)   SpO2 99%   BMI 25.53 kg/m     Wt Readings from Last 3 Encounters:  09/09/23 163 lb (73.9 kg)  06/30/23 163 lb (73.9 kg)  03/19/23 163 lb (73.9 kg)     GEN: Patient is in no acute distress HEENT: Normal NECK: No JVD;  No carotid bruits LYMPHATICS: No lymphadenopathy CARDIAC: Hear sounds regular, 2/6 systolic murmur at the apex. RESPIRATORY:  Clear to auscultation without rales, wheezing or rhonchi  ABDOMEN: Soft, non-tender, non-distended MUSCULOSKELETAL:  No edema; No deformity  SKIN: Warm and dry NEUROLOGIC:  Alert and oriented x 3 PSYCHIATRIC:  Normal affect   Signed, Garwin Brothers, MD  09/09/2023 8:14 AM    [facility]

## 2023-09-15 ENCOUNTER — Ambulatory Visit (INDEPENDENT_AMBULATORY_CARE_PROVIDER_SITE_OTHER): Payer: Medicare Other

## 2023-09-15 DIAGNOSIS — I480 Paroxysmal atrial fibrillation: Secondary | ICD-10-CM

## 2023-09-15 DIAGNOSIS — I442 Atrioventricular block, complete: Secondary | ICD-10-CM

## 2023-09-16 LAB — CUP PACEART REMOTE DEVICE CHECK
Battery Remaining Longevity: 82 mo
Battery Remaining Percentage: 67 %
Battery Voltage: 3.01 V
Brady Statistic AP VP Percent: 36 %
Brady Statistic AP VS Percent: 23 %
Brady Statistic AS VP Percent: 24 %
Brady Statistic AS VS Percent: 17 %
Brady Statistic RA Percent Paced: 59 %
Brady Statistic RV Percent Paced: 60 %
Date Time Interrogation Session: 20241016040013
Implantable Lead Connection Status: 753985
Implantable Lead Connection Status: 753985
Implantable Lead Implant Date: 20211019
Implantable Lead Implant Date: 20211019
Implantable Lead Location: 753859
Implantable Lead Location: 753860
Implantable Pulse Generator Implant Date: 20211019
Lead Channel Impedance Value: 400 Ohm
Lead Channel Impedance Value: 450 Ohm
Lead Channel Pacing Threshold Amplitude: 0.5 V
Lead Channel Pacing Threshold Amplitude: 0.75 V
Lead Channel Pacing Threshold Pulse Width: 0.5 ms
Lead Channel Pacing Threshold Pulse Width: 0.5 ms
Lead Channel Sensing Intrinsic Amplitude: 5 mV
Lead Channel Sensing Intrinsic Amplitude: 7.6 mV
Lead Channel Setting Pacing Amplitude: 1 V
Lead Channel Setting Pacing Amplitude: 1.5 V
Lead Channel Setting Pacing Pulse Width: 0.5 ms
Lead Channel Setting Sensing Sensitivity: 4 mV
Pulse Gen Model: 2272
Pulse Gen Serial Number: 6220383

## 2023-09-17 DIAGNOSIS — G4733 Obstructive sleep apnea (adult) (pediatric): Secondary | ICD-10-CM | POA: Diagnosis not present

## 2023-10-04 DIAGNOSIS — Z01818 Encounter for other preprocedural examination: Secondary | ICD-10-CM | POA: Diagnosis not present

## 2023-10-04 DIAGNOSIS — K5790 Diverticulosis of intestine, part unspecified, without perforation or abscess without bleeding: Secondary | ICD-10-CM | POA: Diagnosis not present

## 2023-10-04 NOTE — Progress Notes (Signed)
Remote pacemaker transmission.   

## 2023-10-18 DIAGNOSIS — G4733 Obstructive sleep apnea (adult) (pediatric): Secondary | ICD-10-CM | POA: Diagnosis not present

## 2023-10-26 DIAGNOSIS — I10 Essential (primary) hypertension: Secondary | ICD-10-CM | POA: Diagnosis not present

## 2023-10-26 DIAGNOSIS — Z950 Presence of cardiac pacemaker: Secondary | ICD-10-CM | POA: Diagnosis not present

## 2023-10-26 DIAGNOSIS — E785 Hyperlipidemia, unspecified: Secondary | ICD-10-CM | POA: Diagnosis not present

## 2023-10-26 DIAGNOSIS — Z79899 Other long term (current) drug therapy: Secondary | ICD-10-CM | POA: Diagnosis not present

## 2023-10-26 DIAGNOSIS — I480 Paroxysmal atrial fibrillation: Secondary | ICD-10-CM | POA: Diagnosis not present

## 2023-10-26 DIAGNOSIS — E559 Vitamin D deficiency, unspecified: Secondary | ICD-10-CM | POA: Diagnosis not present

## 2023-10-26 LAB — LAB REPORT - SCANNED
A1c: 5.4
EGFR: 83

## 2023-11-01 DIAGNOSIS — K080 Exfoliation of teeth due to systemic causes: Secondary | ICD-10-CM | POA: Diagnosis not present

## 2023-11-09 ENCOUNTER — Encounter: Payer: Self-pay | Admitting: Cardiology

## 2023-11-17 DIAGNOSIS — G4733 Obstructive sleep apnea (adult) (pediatric): Secondary | ICD-10-CM | POA: Diagnosis not present

## 2023-12-14 ENCOUNTER — Encounter: Payer: Self-pay | Admitting: Cardiology

## 2023-12-14 MED ORDER — RIVAROXABAN 20 MG PO TABS: 20.0000 mg | ORAL_TABLET | Freq: Every day | ORAL | 2 refills | Status: DC

## 2023-12-15 ENCOUNTER — Ambulatory Visit (INDEPENDENT_AMBULATORY_CARE_PROVIDER_SITE_OTHER): Payer: Medicare Other

## 2023-12-15 DIAGNOSIS — I441 Atrioventricular block, second degree: Secondary | ICD-10-CM | POA: Diagnosis not present

## 2023-12-15 LAB — CUP PACEART REMOTE DEVICE CHECK
Battery Remaining Longevity: 76 mo
Battery Remaining Percentage: 65 %
Battery Voltage: 3.01 V
Brady Statistic AP VP Percent: 44 %
Brady Statistic AP VS Percent: 16 %
Brady Statistic AS VP Percent: 30 %
Brady Statistic AS VS Percent: 11 %
Brady Statistic RA Percent Paced: 59 %
Brady Statistic RV Percent Paced: 73 %
Date Time Interrogation Session: 20250115040015
Implantable Lead Connection Status: 753985
Implantable Lead Connection Status: 753985
Implantable Lead Implant Date: 20211019
Implantable Lead Implant Date: 20211019
Implantable Lead Location: 753859
Implantable Lead Location: 753860
Implantable Pulse Generator Implant Date: 20211019
Lead Channel Impedance Value: 380 Ohm
Lead Channel Impedance Value: 440 Ohm
Lead Channel Pacing Threshold Amplitude: 0.625 V
Lead Channel Pacing Threshold Amplitude: 0.75 V
Lead Channel Pacing Threshold Pulse Width: 0.5 ms
Lead Channel Pacing Threshold Pulse Width: 0.5 ms
Lead Channel Sensing Intrinsic Amplitude: 5 mV
Lead Channel Sensing Intrinsic Amplitude: 7.6 mV
Lead Channel Setting Pacing Amplitude: 1 V
Lead Channel Setting Pacing Amplitude: 1.625
Lead Channel Setting Pacing Pulse Width: 0.5 ms
Lead Channel Setting Sensing Sensitivity: 4 mV
Pulse Gen Model: 2272
Pulse Gen Serial Number: 6220383

## 2023-12-16 DIAGNOSIS — G4733 Obstructive sleep apnea (adult) (pediatric): Secondary | ICD-10-CM | POA: Diagnosis not present

## 2024-01-16 DIAGNOSIS — G4733 Obstructive sleep apnea (adult) (pediatric): Secondary | ICD-10-CM | POA: Diagnosis not present

## 2024-01-25 NOTE — Progress Notes (Signed)
 Remote pacemaker transmission.

## 2024-02-13 DIAGNOSIS — G4733 Obstructive sleep apnea (adult) (pediatric): Secondary | ICD-10-CM | POA: Diagnosis not present

## 2024-02-15 DIAGNOSIS — E785 Hyperlipidemia, unspecified: Secondary | ICD-10-CM | POA: Diagnosis not present

## 2024-02-15 DIAGNOSIS — Z950 Presence of cardiac pacemaker: Secondary | ICD-10-CM | POA: Diagnosis not present

## 2024-02-15 DIAGNOSIS — I10 Essential (primary) hypertension: Secondary | ICD-10-CM | POA: Diagnosis not present

## 2024-02-15 DIAGNOSIS — I480 Paroxysmal atrial fibrillation: Secondary | ICD-10-CM | POA: Diagnosis not present

## 2024-02-23 DIAGNOSIS — H524 Presbyopia: Secondary | ICD-10-CM | POA: Diagnosis not present

## 2024-03-08 DIAGNOSIS — R972 Elevated prostate specific antigen [PSA]: Secondary | ICD-10-CM | POA: Diagnosis not present

## 2024-03-08 DIAGNOSIS — N401 Enlarged prostate with lower urinary tract symptoms: Secondary | ICD-10-CM | POA: Diagnosis not present

## 2024-03-08 DIAGNOSIS — R3129 Other microscopic hematuria: Secondary | ICD-10-CM | POA: Diagnosis not present

## 2024-03-15 ENCOUNTER — Ambulatory Visit (INDEPENDENT_AMBULATORY_CARE_PROVIDER_SITE_OTHER): Payer: Medicare Other

## 2024-03-15 DIAGNOSIS — I441 Atrioventricular block, second degree: Secondary | ICD-10-CM | POA: Diagnosis not present

## 2024-03-15 DIAGNOSIS — G4733 Obstructive sleep apnea (adult) (pediatric): Secondary | ICD-10-CM | POA: Diagnosis not present

## 2024-03-16 LAB — CUP PACEART REMOTE DEVICE CHECK
Battery Remaining Longevity: 72 mo
Battery Remaining Percentage: 62 %
Battery Voltage: 3.01 V
Brady Statistic AP VP Percent: 46 %
Brady Statistic AP VS Percent: 12 %
Brady Statistic AS VP Percent: 34 %
Brady Statistic AS VS Percent: 8.4 %
Brady Statistic RA Percent Paced: 57 %
Brady Statistic RV Percent Paced: 80 %
Date Time Interrogation Session: 20250416040025
Implantable Lead Connection Status: 753985
Implantable Lead Connection Status: 753985
Implantable Lead Implant Date: 20211019
Implantable Lead Implant Date: 20211019
Implantable Lead Location: 753859
Implantable Lead Location: 753860
Implantable Pulse Generator Implant Date: 20211019
Lead Channel Impedance Value: 380 Ohm
Lead Channel Impedance Value: 440 Ohm
Lead Channel Pacing Threshold Amplitude: 0.625 V
Lead Channel Pacing Threshold Amplitude: 0.875 V
Lead Channel Pacing Threshold Pulse Width: 0.5 ms
Lead Channel Pacing Threshold Pulse Width: 0.5 ms
Lead Channel Sensing Intrinsic Amplitude: 1 mV
Lead Channel Sensing Intrinsic Amplitude: 7.6 mV
Lead Channel Setting Pacing Amplitude: 1.125
Lead Channel Setting Pacing Amplitude: 1.625
Lead Channel Setting Pacing Pulse Width: 0.5 ms
Lead Channel Setting Sensing Sensitivity: 4 mV
Pulse Gen Model: 2272
Pulse Gen Serial Number: 6220383

## 2024-03-20 ENCOUNTER — Ambulatory Visit: Attending: Cardiology | Admitting: Cardiology

## 2024-03-20 ENCOUNTER — Encounter: Payer: Self-pay | Admitting: Cardiology

## 2024-03-20 VITALS — BP 140/82 | HR 64 | Ht 67.0 in | Wt 166.4 lb

## 2024-03-20 DIAGNOSIS — D6869 Other thrombophilia: Secondary | ICD-10-CM | POA: Diagnosis not present

## 2024-03-20 DIAGNOSIS — I10 Essential (primary) hypertension: Secondary | ICD-10-CM

## 2024-03-20 DIAGNOSIS — I480 Paroxysmal atrial fibrillation: Secondary | ICD-10-CM | POA: Diagnosis not present

## 2024-03-20 DIAGNOSIS — I442 Atrioventricular block, complete: Secondary | ICD-10-CM

## 2024-03-20 LAB — CUP PACEART INCLINIC DEVICE CHECK
Battery Remaining Longevity: 73 mo
Battery Voltage: 3.01 V
Brady Statistic RA Percent Paced: 57 %
Brady Statistic RV Percent Paced: 80 %
Date Time Interrogation Session: 20250421172624
Implantable Lead Connection Status: 753985
Implantable Lead Connection Status: 753985
Implantable Lead Implant Date: 20211019
Implantable Lead Implant Date: 20211019
Implantable Lead Location: 753859
Implantable Lead Location: 753860
Implantable Pulse Generator Implant Date: 20211019
Lead Channel Impedance Value: 387.5 Ohm
Lead Channel Impedance Value: 487.5 Ohm
Lead Channel Pacing Threshold Amplitude: 0.5 V
Lead Channel Pacing Threshold Amplitude: 0.5 V
Lead Channel Pacing Threshold Amplitude: 0.75 V
Lead Channel Pacing Threshold Amplitude: 0.75 V
Lead Channel Pacing Threshold Pulse Width: 0.5 ms
Lead Channel Pacing Threshold Pulse Width: 0.5 ms
Lead Channel Pacing Threshold Pulse Width: 0.5 ms
Lead Channel Pacing Threshold Pulse Width: 0.5 ms
Lead Channel Sensing Intrinsic Amplitude: 5 mV
Lead Channel Sensing Intrinsic Amplitude: 7.6 mV
Lead Channel Setting Pacing Amplitude: 1.125
Lead Channel Setting Pacing Amplitude: 1.5 V
Lead Channel Setting Pacing Pulse Width: 0.5 ms
Lead Channel Setting Sensing Sensitivity: 4 mV
Pulse Gen Model: 2272
Pulse Gen Serial Number: 6220383

## 2024-03-20 NOTE — Progress Notes (Unsigned)
  Electrophysiology Office Note:   Date:  03/20/2024  ID:  Ricky Hood, DOB 1946/06/25, MRN [id number]  Primary Cardiologist: Nelia Balzarine, MD Primary Heart Failure: None Electrophysiologist: Chrisangel Eskenazi Cortland Ding, MD  {Click to update primary MD,subspecialty MD or APP then REFRESH:1}    History of Present Illness:   Ricky Hood is a 78 y.o. male with h/o diastolic heart failure, ascending aortic aneurysm, complete heart block, hypertension, hyperlipidemia, sleep apnea, atrial fibrillation seen today for routine electrophysiology followup.   Since last being seen in our clinic the patient reports doing well.  He has no chest pain or shortness of breath.  He continues to do all of his daily activities without restriction.  His blood pressure is mildly elevated today, but is well-controlled at home.  Blood pressures at home are in the 1 teens to 120 range.  He feels well and is without complaint.  he denies chest pain, palpitations, dyspnea, PND, orthopnea, nausea, vomiting, dizziness, syncope, edema, weight gain, or early satiety.   Review of systems complete and found to be negative unless listed in HPI.      EP Information / Studies Reviewed:    EKG is ordered today. Personal review as below.      PPM Interrogation-  reviewed in detail today,  See PACEART report.  Device History: Abbott Dual Chamber PPM implanted 09/24/2020 for CHB  Risk Assessment/Calculations:    CHA2DS2-VASc Score = 4  {Click here to calculate score.  REFRESH note before signing. :1} This indicates a 4.8% annual risk of stroke. The patient's score is based upon: CHF History: 0 HTN History: 1 Diabetes History: 0 Stroke History: 0 Vascular Disease History: 1 Age Score: 2 Gender Score: 0   {This patient has a significant risk of stroke if diagnosed with atrial fibrillation.  Please consider VKA or DOAC agent for anticoagulation if the bleeding risk is acceptable.   You can also use the SmartPhrase  .[email or web address] for documentation.   :[id number]}         Physical Exam:   VS:  There were no vitals taken for this visit.   Wt Readings from Last 3 Encounters:  09/09/23 163 lb (73.9 kg)  06/30/23 163 lb (73.9 kg)  03/19/23 163 lb (73.9 kg)     GEN: Well nourished, well developed in no acute distress NECK: No JVD; No carotid bruits CARDIAC: Regular rate and rhythm, no murmurs, rubs, gallops RESPIRATORY:  Clear to auscultation without rales, wheezing or rhonchi  ABDOMEN: Soft, non-tender, non-distended EXTREMITIES:  No edema; No deformity   ASSESSMENT AND PLAN:    CHB s/p Abbott PPM  Normal PPM function See Pace Art report Sensing, threshold, impedance within normal limits Programming reviewed and appropriate for patient No changes today  2.  Paroxysmal atrial fibrillation: Minimal noted on device interrogation.  Minimal symptoms.  3.  Hypertension: Mildly elevated.  Usually well-controlled.  4.  Chronic diastolic heart failure: No obvious volume overload  5.  Secondary hypercoagulable state: On Xarelto  for atrial fibrillation  Disposition:   Follow up with Dr. Lawana Pray in 12 months  Signed, Anastyn Ayars Cortland Ding, MD

## 2024-04-14 DIAGNOSIS — G4733 Obstructive sleep apnea (adult) (pediatric): Secondary | ICD-10-CM | POA: Diagnosis not present

## 2024-04-26 DIAGNOSIS — H348312 Tributary (branch) retinal vein occlusion, right eye, stable: Secondary | ICD-10-CM | POA: Diagnosis not present

## 2024-04-26 DIAGNOSIS — H25813 Combined forms of age-related cataract, bilateral: Secondary | ICD-10-CM | POA: Diagnosis not present

## 2024-04-26 NOTE — Addendum Note (Signed)
 Addended by: Lott Rouleau A on: 04/26/2024 01:49 PM   Modules accepted: Orders

## 2024-04-26 NOTE — Progress Notes (Signed)
 Remote pacemaker transmission.

## 2024-05-08 ENCOUNTER — Other Ambulatory Visit: Payer: Self-pay | Admitting: Surgery

## 2024-05-08 DIAGNOSIS — I7121 Aneurysm of the ascending aorta, without rupture: Secondary | ICD-10-CM

## 2024-05-11 ENCOUNTER — Ambulatory Visit: Attending: Cardiology | Admitting: Cardiology

## 2024-05-11 ENCOUNTER — Encounter: Payer: Self-pay | Admitting: Cardiology

## 2024-05-11 VITALS — BP 128/70 | HR 64 | Resp 20 | Ht 67.0 in | Wt 163.6 lb

## 2024-05-11 DIAGNOSIS — I442 Atrioventricular block, complete: Secondary | ICD-10-CM | POA: Diagnosis not present

## 2024-05-11 DIAGNOSIS — I10 Essential (primary) hypertension: Secondary | ICD-10-CM | POA: Diagnosis not present

## 2024-05-11 DIAGNOSIS — E782 Mixed hyperlipidemia: Secondary | ICD-10-CM

## 2024-05-11 DIAGNOSIS — I480 Paroxysmal atrial fibrillation: Secondary | ICD-10-CM | POA: Diagnosis not present

## 2024-05-11 DIAGNOSIS — I7121 Aneurysm of the ascending aorta, without rupture: Secondary | ICD-10-CM | POA: Diagnosis not present

## 2024-05-11 DIAGNOSIS — I77810 Thoracic aortic ectasia: Secondary | ICD-10-CM

## 2024-05-11 DIAGNOSIS — Z950 Presence of cardiac pacemaker: Secondary | ICD-10-CM

## 2024-05-11 DIAGNOSIS — G4733 Obstructive sleep apnea (adult) (pediatric): Secondary | ICD-10-CM

## 2024-05-11 NOTE — Patient Instructions (Signed)

## 2024-05-11 NOTE — Progress Notes (Signed)
 Cardiology Office Note:    Date:  05/11/2024   ID:  Ricky Hood, DOB 1946-02-10, MRN [id number]  PCP:  Tamela Fake, MD  Cardiologist:  Nelia Balzarine, MD   Referring MD: Tamela Fake, MD    ASSESSMENT:    1. Aneurysm of ascending aorta without rupture (HCC)   2. Complete heart block (HCC)   3. Dilated aortic root (HCC)   4. Primary hypertension   5. Paroxysmal atrial fibrillation (HCC)   6. St Jude Medical Assurity MRI conditional  dual-chamber pacemaker for symptomatic complete heart block     7. Mixed hyperlipidemia   8. Obstructive sleep apnea (adult) (pediatric)    PLAN:    In order of problems listed above:  Primary prevention stressed with the patient.  Importance of compliance with diet medication stressed and patient verbalized standing. He exercises well on a regular basis.  And I encouraged him to continue this. Complete heart block: Post permanent pacemaker: Followed by electrophysiology colleagues.  Notes reviewed and discussed with the patient. Essential hypertension: Blood pressure stable and diet was emphasized. Mixed dyslipidemia: On lipid-lowering medications.  Lipids reviewed from [facility] notes and discussed with the patient.  He is due for blood work next month and he will send me a copy. Ascending aortic aneurysm: Stable.  Symptoms discussed and educated.  He has an appointment with cardiothoracic surgeons for CT in follow-up. Patient will be seen in follow-up appointment in 9 months or earlier if the patient has any concerns.    Medication Adjustments/Labs and Tests Ordered: Current medicines are reviewed at length with the patient today.  Concerns regarding medicines are outlined above.  No orders of the defined types were placed in this encounter.  No orders of the defined types were placed in this encounter.    No chief complaint on file.    History of Present Illness:    Ricky Hood is a 78 y.o. male.  Patient has past medical history of  essential hypertension, mixed dyslipidemia, paroxysmal atrial fibrillation and ascending aortic aneurysm.  Patient denies any problems at this time and takes care of activities of daily living.  No chest pain orthopnea or PND.  He exercises on a regular basis.  At the time of my evaluation, the patient is alert awake oriented and in no distress.  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/10/2014   Hypertension    Hyponatremia 11/24/2020   Obstructive sleep apnea (adult) (pediatric) 08/10/2014   Paroxysmal atrial fibrillation (HCC)    Pericardial effusion 11/24/2020   Pleural effusion    Presence of permanent cardiac pacemaker 09/17/2020   Second degree AV block    Snoring 08/10/2014   St Jude Medical Assurity MRI conditional  dual-chamber pacemaker for symptomatic complete heart block   09/24/2020    Past Surgical History:  Procedure Laterality Date   BIOPSY PROSTATE     COLONOSCOPY     PACEMAKER IMPLANT N/A 09/17/2020   SJM Assurity DR implanted by Dr Nunzio Belch for AV block and syncope    Current Medications: Current Meds  Medication Sig   acetaminophen  (TYLENOL ) 325 MG tablet Take 325-650 mg by mouth daily as needed for headache (pain).   ascorbic acid (VITAMIN C) 500 MG tablet Take 500 mg by mouth  daily after supper.   Cholecalciferol (VITAMIN D3) 50 MCG (2000 UT) TABS Take 2,000 Units by mouth daily after supper.   finasteride  (PROSCAR ) 5 MG tablet Take 5 mg by mouth daily after supper.   fish oil-omega-3 fatty acids 1000 MG capsule Take 3 g by mouth daily after supper.   furosemide  (LASIX ) 40 MG tablet Take 40 mg by mouth as needed for edema. Or weight gain of 3 pounds in 1 day or 5 pounds in 1 week   ibuprofen  (ADVIL ) 600 MG tablet Take 1  tablet (600 mg total) by mouth 3 (three) times daily.   lisinopril  (ZESTRIL ) 10 MG tablet Take 10 mg by mouth daily after supper.   Multiple Vitamin (MULTIVITAMIN WITH MINERALS) TABS tablet Take 1 tablet by mouth daily after supper.   rivaroxaban  (XARELTO ) 20 MG TABS tablet Take 1 tablet (20 mg total) by mouth daily with supper.   rosuvastatin  (CRESTOR ) 5 MG tablet Take 5 mg by mouth daily after supper.   TART CHERRY PO Take 30 mLs by mouth 2 (two) times daily with a meal.     Allergies:   Patient has no known allergies.   Social History   Socioeconomic History   Marital status: Married    Spouse name: Not on file   Number of children: 3   Years of education: Not on file   Highest education level: Not on file  Occupational History   Not on file  Tobacco Use   Smoking status: Never   Smokeless tobacco: Never  Vaping Use   Vaping status: Never Used  Substance and Sexual Activity   Alcohol use: Never   Drug use: Never   Sexual activity: Not on file  Other Topics Concern   Not on file  Social History Narrative   Not on file   Social Drivers of Health   Financial Resource Strain: Not on file  Food Insecurity: Not on file  Transportation Needs: Not on file  Physical Activity: Not on file  Stress: Not on file  Social Connections: Unknown (04/13/2022)   Received from [facility]   Social Network    Social Network: Not on file     Family History: The patient's family history includes Hyperlipidemia in his father and mother; Hypertension in his father and mother; Other in his mother.  ROS:   Please see the history of present illness.    All other systems reviewed and are negative.  EKGs/Labs/Other Studies Reviewed:    The following studies were reviewed today: I discussed my findings with the patient at length   Recent Labs: No results found for requested labs within last 365 days.  Recent Lipid Panel    Component Value Date/Time   CHOL 117 06/18/2021 0956    TRIG 35 06/18/2021 0956   HDL 58 06/18/2021 0956   CHOLHDL 2.0 06/18/2021 0956   LDLCALC 49 06/18/2021 0956    Physical Exam:    VS:  BP 128/70 (BP Location: Left Arm, Patient Position: Sitting, Cuff Size: Normal)   Pulse 64   Resp 20   Ht 5' 7 (1.702 m)   Wt 163 lb 9.6 oz (74.2 kg)   SpO2 98%   BMI 25.62 kg/m     Wt Readings from Last 3 Encounters:  05/11/24 163 lb 9.6 oz (74.2 kg)  03/20/24 166 lb 6.4 oz (75.5 kg)  09/09/23 163 lb (73.9 kg)     GEN: Patient is in no acute distress HEENT: Normal NECK: No JVD;  No carotid bruits LYMPHATICS: No lymphadenopathy CARDIAC: Hear sounds regular, 2/6 systolic murmur at the apex. RESPIRATORY:  Clear to auscultation without rales, wheezing or rhonchi  ABDOMEN: Soft, non-tender, non-distended MUSCULOSKELETAL:  No edema; No deformity  SKIN: Warm and dry NEUROLOGIC:  Alert and oriented x 3 PSYCHIATRIC:  Normal affect   Signed, Nelia Balzarine, MD  05/11/2024 10:31 AM    [facility]

## 2024-05-15 DIAGNOSIS — G4733 Obstructive sleep apnea (adult) (pediatric): Secondary | ICD-10-CM | POA: Diagnosis not present

## 2024-05-31 DIAGNOSIS — K080 Exfoliation of teeth due to systemic causes: Secondary | ICD-10-CM | POA: Diagnosis not present

## 2024-06-13 DIAGNOSIS — G4733 Obstructive sleep apnea (adult) (pediatric): Secondary | ICD-10-CM | POA: Diagnosis not present

## 2024-06-14 ENCOUNTER — Ambulatory Visit: Payer: Medicare Other

## 2024-06-14 DIAGNOSIS — I441 Atrioventricular block, second degree: Secondary | ICD-10-CM | POA: Diagnosis not present

## 2024-06-14 DIAGNOSIS — G4733 Obstructive sleep apnea (adult) (pediatric): Secondary | ICD-10-CM | POA: Diagnosis not present

## 2024-06-15 ENCOUNTER — Ambulatory Visit: Payer: Self-pay | Admitting: Cardiology

## 2024-06-15 DIAGNOSIS — M159 Polyosteoarthritis, unspecified: Secondary | ICD-10-CM | POA: Diagnosis not present

## 2024-06-15 DIAGNOSIS — I10 Essential (primary) hypertension: Secondary | ICD-10-CM | POA: Diagnosis not present

## 2024-06-15 DIAGNOSIS — Z79899 Other long term (current) drug therapy: Secondary | ICD-10-CM | POA: Diagnosis not present

## 2024-06-15 DIAGNOSIS — I480 Paroxysmal atrial fibrillation: Secondary | ICD-10-CM | POA: Diagnosis not present

## 2024-06-15 DIAGNOSIS — E785 Hyperlipidemia, unspecified: Secondary | ICD-10-CM | POA: Diagnosis not present

## 2024-06-15 DIAGNOSIS — Z950 Presence of cardiac pacemaker: Secondary | ICD-10-CM | POA: Diagnosis not present

## 2024-06-15 DIAGNOSIS — E559 Vitamin D deficiency, unspecified: Secondary | ICD-10-CM | POA: Diagnosis not present

## 2024-06-15 LAB — CUP PACEART REMOTE DEVICE CHECK
Battery Remaining Longevity: 68 mo
Battery Remaining Percentage: 59 %
Battery Voltage: 2.99 V
Brady Statistic AP VP Percent: 58 %
Brady Statistic AP VS Percent: 1 %
Brady Statistic AS VP Percent: 42 %
Brady Statistic AS VS Percent: 1 %
Brady Statistic RA Percent Paced: 58 %
Brady Statistic RV Percent Paced: 99 %
Date Time Interrogation Session: 20250716040015
Implantable Lead Connection Status: 753985
Implantable Lead Connection Status: 753985
Implantable Lead Implant Date: 20211019
Implantable Lead Implant Date: 20211019
Implantable Lead Location: 753859
Implantable Lead Location: 753860
Implantable Pulse Generator Implant Date: 20211019
Lead Channel Impedance Value: 390 Ohm
Lead Channel Impedance Value: 450 Ohm
Lead Channel Pacing Threshold Amplitude: 0.625 V
Lead Channel Pacing Threshold Amplitude: 0.875 V
Lead Channel Pacing Threshold Pulse Width: 0.5 ms
Lead Channel Pacing Threshold Pulse Width: 0.5 ms
Lead Channel Sensing Intrinsic Amplitude: 12 mV
Lead Channel Sensing Intrinsic Amplitude: 5 mV
Lead Channel Setting Pacing Amplitude: 1.125
Lead Channel Setting Pacing Amplitude: 1.625
Lead Channel Setting Pacing Pulse Width: 0.5 ms
Lead Channel Setting Sensing Sensitivity: 4 mV
Pulse Gen Model: 2272
Pulse Gen Serial Number: 6220383

## 2024-06-28 ENCOUNTER — Ambulatory Visit (HOSPITAL_COMMUNITY)
Admission: RE | Admit: 2024-06-28 | Discharge: 2024-06-28 | Disposition: A | Discharge status: Home / Self Care | Source: Ambulatory Visit | Attending: Cardiovascular Disease | Admitting: Cardiovascular Disease

## 2024-06-28 DIAGNOSIS — I7121 Aneurysm of the ascending aorta, without rupture: Secondary | ICD-10-CM | POA: Diagnosis not present

## 2024-06-28 DIAGNOSIS — S2242XA Multiple fractures of ribs, left side, initial encounter for closed fracture: Secondary | ICD-10-CM | POA: Diagnosis not present

## 2024-07-05 ENCOUNTER — Ambulatory Visit: Admitting: Surgery

## 2024-07-05 NOTE — Progress Notes (Unsigned)
       [address] [place] [address]             [phone]            Remigio Mcmillon [id number] 1946/11/20   History of Present Illness: Mr. Keylen Eckenrode is a 78 year old male with medical history of complete heart block, acute diastolic heart failure, paroxysmal atrial fibrillation, hypertension, obstructive sleep apnea, and hyperlipidemia who presents for continued follow-up for ascending thoracic aortic aneurysm.     Current Outpatient Medications on File Prior to Visit  Medication Sig Dispense Refill   acetaminophen  (TYLENOL ) 325 MG tablet Take 325-650 mg by mouth daily as needed for headache (pain).     ascorbic acid (VITAMIN C) 500 MG tablet Take 500 mg by mouth daily after supper.     Cholecalciferol (VITAMIN D3) 50 MCG (2000 UT) TABS Take 2,000 Units by mouth daily after supper.     Coenzyme Q10 (COQ10) 50 MG CAPS Take 50 mg by mouth daily.     finasteride  (PROSCAR ) 5 MG tablet Take 5 mg by mouth daily after supper.     fish oil-omega-3 fatty acids 1000 MG capsule Take 3 g by mouth daily after supper.     furosemide  (LASIX ) 40 MG tablet Take 40 mg by mouth as needed for edema. Or weight gain of 3 pounds in 1 day or 5 pounds in 1 week     ibuprofen  (ADVIL ) 600 MG tablet Take 1 tablet (600 mg total) by mouth 3 (three) times daily. 30 tablet 0   lisinopril  (ZESTRIL ) 10 MG tablet Take 10 mg by mouth daily after supper.     loratadine (CLARITIN) 10 MG tablet Take 10 mg by mouth daily after supper.     Multiple Vitamin (MULTIVITAMIN WITH MINERALS) TABS tablet Take 1 tablet by mouth daily after supper.     rivaroxaban  (XARELTO ) 20 MG TABS tablet Take 1 tablet (20 mg total) by mouth daily with supper. 90 tablet 2   rosuvastatin  (CRESTOR ) 5 MG tablet Take 5 mg by mouth daily after supper.     TART CHERRY PO Take 30 mLs by mouth 2 (two) times daily with a meal.     No current facility-administered medications on file prior to visit.      ROS:   There were no vitals taken for this visit.    Imaging:      A/P:      Risk Modification:  Statin:  ***  Smoking cessation instruction/counseling given:  {CHL AMB [facility] SMOKING CESSATION COUNSELING:20758}  Patient was counseled on importance of Blood Pressure Control  They are instructed to contact their Primary Care Physician if they start to have blood pressure readings over 130s/90s. Do not ever stop blood pressure medications on your own, unless instructed by healthcare professional.  Please avoid use of Fluoroquinolones as this can potentially increase your risk of Aortic Rupture and/or Dissection  Patient educated on signs and symptoms of Aortic Dissection, handout also provided in AVS  Manuelita CHRISTELLA Rough, PA-C 07/05/24

## 2024-07-06 ENCOUNTER — Ambulatory Visit

## 2024-07-06 VITALS — BP 140/73 | HR 72 | Resp 20 | Ht 67.0 in | Wt 163.0 lb

## 2024-07-06 DIAGNOSIS — I7121 Aneurysm of the ascending aorta, without rupture: Secondary | ICD-10-CM

## 2024-07-06 NOTE — Patient Instructions (Signed)

## 2024-07-14 DIAGNOSIS — G4733 Obstructive sleep apnea (adult) (pediatric): Secondary | ICD-10-CM | POA: Diagnosis not present

## 2024-08-04 DIAGNOSIS — G4733 Obstructive sleep apnea (adult) (pediatric): Secondary | ICD-10-CM | POA: Diagnosis not present

## 2024-08-14 DIAGNOSIS — G4733 Obstructive sleep apnea (adult) (pediatric): Secondary | ICD-10-CM | POA: Diagnosis not present

## 2024-08-22 ENCOUNTER — Encounter: Payer: Self-pay | Admitting: Cardiology

## 2024-08-22 DIAGNOSIS — Z9181 History of falling: Secondary | ICD-10-CM | POA: Diagnosis not present

## 2024-08-22 DIAGNOSIS — Z0000 Encounter for general adult medical examination without abnormal findings: Secondary | ICD-10-CM | POA: Diagnosis not present

## 2024-08-31 DIAGNOSIS — E785 Hyperlipidemia, unspecified: Secondary | ICD-10-CM | POA: Diagnosis not present

## 2024-08-31 DIAGNOSIS — I480 Paroxysmal atrial fibrillation: Secondary | ICD-10-CM | POA: Diagnosis not present

## 2024-08-31 DIAGNOSIS — Z950 Presence of cardiac pacemaker: Secondary | ICD-10-CM | POA: Diagnosis not present

## 2024-08-31 DIAGNOSIS — I10 Essential (primary) hypertension: Secondary | ICD-10-CM | POA: Diagnosis not present

## 2024-09-05 NOTE — Progress Notes (Signed)
 Remote PPM Transmission

## 2024-09-07 DIAGNOSIS — R972 Elevated prostate specific antigen [PSA]: Secondary | ICD-10-CM | POA: Diagnosis not present

## 2024-09-07 DIAGNOSIS — N401 Enlarged prostate with lower urinary tract symptoms: Secondary | ICD-10-CM | POA: Diagnosis not present

## 2024-09-07 DIAGNOSIS — R3129 Other microscopic hematuria: Secondary | ICD-10-CM | POA: Diagnosis not present

## 2024-09-11 ENCOUNTER — Encounter: Payer: Self-pay | Admitting: Cardiology

## 2024-09-11 ENCOUNTER — Other Ambulatory Visit: Payer: Self-pay | Admitting: Cardiology

## 2024-09-11 NOTE — Telephone Encounter (Signed)
 Prescription refill request for Xarelto  received.  Indication: Last office visit: 05/11/24  R Revankar MD Weight: 74.2kg Age: 78 Scr: 0.96 on 06/15/24  [facility] CrCl: 66.56  Based on above findings Xarelto  20mg  daily is the appropriate dose.  Refill approved.

## 2024-09-13 ENCOUNTER — Ambulatory Visit: Payer: Medicare Other

## 2024-09-13 DIAGNOSIS — I441 Atrioventricular block, second degree: Secondary | ICD-10-CM

## 2024-09-14 LAB — CUP PACEART REMOTE DEVICE CHECK
Battery Remaining Longevity: 66 mo
Battery Remaining Percentage: 57 %
Battery Voltage: 2.99 V
Brady Statistic AP VP Percent: 53 %
Brady Statistic AP VS Percent: 1 %
Brady Statistic AS VP Percent: 46 %
Brady Statistic AS VS Percent: 1 %
Brady Statistic RA Percent Paced: 53 %
Brady Statistic RV Percent Paced: 99 %
Date Time Interrogation Session: 20251015040015
Implantable Lead Connection Status: 753985
Implantable Lead Connection Status: 753985
Implantable Lead Implant Date: 20211019
Implantable Lead Implant Date: 20211019
Implantable Lead Location: 753859
Implantable Lead Location: 753860
Implantable Pulse Generator Implant Date: 20211019
Lead Channel Impedance Value: 400 Ohm
Lead Channel Impedance Value: 450 Ohm
Lead Channel Pacing Threshold Amplitude: 0.5 V
Lead Channel Pacing Threshold Amplitude: 0.875 V
Lead Channel Pacing Threshold Pulse Width: 0.5 ms
Lead Channel Pacing Threshold Pulse Width: 0.5 ms
Lead Channel Sensing Intrinsic Amplitude: 5 mV
Lead Channel Sensing Intrinsic Amplitude: 9.4 mV
Lead Channel Setting Pacing Amplitude: 1.125
Lead Channel Setting Pacing Amplitude: 1.5 V
Lead Channel Setting Pacing Pulse Width: 0.5 ms
Lead Channel Setting Sensing Sensitivity: 4 mV
Pulse Gen Model: 2272
Pulse Gen Serial Number: 6220383

## 2024-09-15 NOTE — Progress Notes (Signed)
 Remote PPM Transmission

## 2024-09-18 ENCOUNTER — Ambulatory Visit: Payer: Self-pay | Admitting: Cardiology

## 2024-10-09 ENCOUNTER — Encounter: Payer: Self-pay | Admitting: Cardiology

## 2024-10-10 ENCOUNTER — Telehealth: Payer: Self-pay

## 2024-10-10 MED ORDER — FUROSEMIDE 20 MG PO TABS: 20.0000 mg | ORAL_TABLET | Freq: Every day | ORAL | 0 refills | Status: AC | PRN

## 2024-10-10 NOTE — Telephone Encounter (Signed)
 Lasix  20mg  1 tablet daily as needed for swelling per Dr. Edwyna. #30 0 ref

## 2024-11-28 ENCOUNTER — Observation Stay (HOSPITAL_COMMUNITY)
Admission: EM | Admit: 2024-11-28 | Discharge: 2024-12-01 | Disposition: A | Source: Home / Self Care | Attending: Emergency Medicine | Admitting: Emergency Medicine

## 2024-11-28 ENCOUNTER — Emergency Department (HOSPITAL_COMMUNITY)

## 2024-11-28 ENCOUNTER — Other Ambulatory Visit: Payer: Self-pay

## 2024-11-28 ENCOUNTER — Encounter (HOSPITAL_COMMUNITY): Payer: Self-pay

## 2024-11-28 DIAGNOSIS — I480 Paroxysmal atrial fibrillation: Secondary | ICD-10-CM | POA: Diagnosis not present

## 2024-11-28 DIAGNOSIS — E782 Mixed hyperlipidemia: Secondary | ICD-10-CM

## 2024-11-28 DIAGNOSIS — I110 Hypertensive heart disease with heart failure: Secondary | ICD-10-CM | POA: Insufficient documentation

## 2024-11-28 DIAGNOSIS — N19 Unspecified kidney failure: Secondary | ICD-10-CM | POA: Diagnosis not present

## 2024-11-28 DIAGNOSIS — E860 Dehydration: Secondary | ICD-10-CM | POA: Insufficient documentation

## 2024-11-28 DIAGNOSIS — Z7901 Long term (current) use of anticoagulants: Secondary | ICD-10-CM | POA: Insufficient documentation

## 2024-11-28 DIAGNOSIS — M545 Low back pain, unspecified: Secondary | ICD-10-CM | POA: Diagnosis not present

## 2024-11-28 DIAGNOSIS — R2681 Unsteadiness on feet: Secondary | ICD-10-CM | POA: Diagnosis not present

## 2024-11-28 DIAGNOSIS — N400 Enlarged prostate without lower urinary tract symptoms: Secondary | ICD-10-CM | POA: Insufficient documentation

## 2024-11-28 DIAGNOSIS — M6281 Muscle weakness (generalized): Secondary | ICD-10-CM | POA: Diagnosis not present

## 2024-11-28 DIAGNOSIS — R2689 Other abnormalities of gait and mobility: Secondary | ICD-10-CM | POA: Diagnosis not present

## 2024-11-28 DIAGNOSIS — Z79899 Other long term (current) drug therapy: Secondary | ICD-10-CM | POA: Diagnosis not present

## 2024-11-28 DIAGNOSIS — Z950 Presence of cardiac pacemaker: Secondary | ICD-10-CM | POA: Diagnosis not present

## 2024-11-28 DIAGNOSIS — G9340 Encephalopathy, unspecified: Secondary | ICD-10-CM | POA: Diagnosis present

## 2024-11-28 DIAGNOSIS — G9341 Metabolic encephalopathy: Secondary | ICD-10-CM | POA: Diagnosis not present

## 2024-11-28 DIAGNOSIS — R4182 Altered mental status, unspecified: Principal | ICD-10-CM

## 2024-11-28 DIAGNOSIS — I5031 Acute diastolic (congestive) heart failure: Secondary | ICD-10-CM | POA: Insufficient documentation

## 2024-11-28 DIAGNOSIS — N179 Acute kidney failure, unspecified: Secondary | ICD-10-CM | POA: Insufficient documentation

## 2024-11-28 DIAGNOSIS — E785 Hyperlipidemia, unspecified: Secondary | ICD-10-CM | POA: Insufficient documentation

## 2024-11-28 DIAGNOSIS — Z8679 Personal history of other diseases of the circulatory system: Secondary | ICD-10-CM | POA: Diagnosis not present

## 2024-11-28 DIAGNOSIS — R7989 Other specified abnormal findings of blood chemistry: Secondary | ICD-10-CM | POA: Diagnosis not present

## 2024-11-28 LAB — CBC WITH DIFFERENTIAL/PLATELET
Abs Immature Granulocytes: 0.08 K/uL — ABNORMAL HIGH (ref 0.00–0.07)
Basophils Absolute: 0.1 K/uL (ref 0.0–0.1)
Basophils Relative: 1 %
Eosinophils Absolute: 0.1 K/uL (ref 0.0–0.5)
Eosinophils Relative: 1 %
HCT: 39.1 % (ref 39.0–52.0)
Hemoglobin: 13.3 g/dL (ref 13.0–17.0)
Immature Granulocytes: 1 %
Lymphocytes Relative: 16 %
Lymphs Abs: 1.5 K/uL (ref 0.7–4.0)
MCH: 32.4 pg (ref 26.0–34.0)
MCHC: 34 g/dL (ref 30.0–36.0)
MCV: 95.4 fL (ref 80.0–100.0)
Monocytes Absolute: 1 K/uL (ref 0.1–1.0)
Monocytes Relative: 11 %
Neutro Abs: 7 K/uL (ref 1.7–7.7)
Neutrophils Relative %: 70 %
Platelets: 222 K/uL (ref 150–400)
RBC: 4.1 MIL/uL — ABNORMAL LOW (ref 4.22–5.81)
RDW: 12.5 % (ref 11.5–15.5)
WBC: 9.7 K/uL (ref 4.0–10.5)
nRBC: 0 % (ref 0.0–0.2)

## 2024-11-28 LAB — RESP PANEL BY RT-PCR (RSV, FLU A&B, COVID)  RVPGX2
Influenza A by PCR: NEGATIVE
Influenza B by PCR: NEGATIVE
Resp Syncytial Virus by PCR: NEGATIVE
SARS Coronavirus 2 by RT PCR: NEGATIVE

## 2024-11-28 LAB — URINALYSIS, W/ REFLEX TO CULTURE (INFECTION SUSPECTED)
Bilirubin Urine: NEGATIVE
Glucose, UA: NEGATIVE mg/dL
Ketones, ur: NEGATIVE mg/dL
Nitrite: NEGATIVE
Protein, ur: NEGATIVE mg/dL
Specific Gravity, Urine: 1.009 (ref 1.005–1.030)
pH: 5 (ref 5.0–8.0)

## 2024-11-28 LAB — URINE DRUG SCREEN
Amphetamines: NEGATIVE
Barbiturates: NEGATIVE
Benzodiazepines: NEGATIVE
Cocaine: NEGATIVE
Fentanyl: NEGATIVE
Methadone Scn, Ur: NEGATIVE
Opiates: NEGATIVE
Tetrahydrocannabinol: NEGATIVE

## 2024-11-28 LAB — COMPREHENSIVE METABOLIC PANEL WITH GFR
ALT: 15 U/L (ref 0–44)
AST: 19 U/L (ref 15–41)
Albumin: 3.7 g/dL (ref 3.5–5.0)
Alkaline Phosphatase: 78 U/L (ref 38–126)
Anion gap: 12 (ref 5–15)
BUN: 27 mg/dL — ABNORMAL HIGH (ref 8–23)
CO2: 23 mmol/L (ref 22–32)
Calcium: 9.2 mg/dL (ref 8.9–10.3)
Chloride: 106 mmol/L (ref 98–111)
Creatinine, Ser: 1.23 mg/dL (ref 0.61–1.24)
GFR, Estimated: >60 mL/min
Glucose, Bld: 92 mg/dL (ref 70–99)
Potassium: 4.1 mmol/L (ref 3.5–5.1)
Sodium: 141 mmol/L (ref 135–145)
Total Bilirubin: 0.4 mg/dL (ref 0.0–1.2)
Total Protein: 7 g/dL (ref 6.5–8.1)

## 2024-11-28 LAB — I-STAT CHEM 8, ED
BUN: 27 mg/dL — ABNORMAL HIGH (ref 8–23)
Calcium, Ion: 1.16 mmol/L (ref 1.15–1.40)
Chloride: 106 mmol/L (ref 98–111)
Creatinine, Ser: 1.4 mg/dL — ABNORMAL HIGH (ref 0.61–1.24)
Glucose, Bld: 91 mg/dL (ref 70–99)
HCT: 38 % — ABNORMAL LOW (ref 39.0–52.0)
Hemoglobin: 12.9 g/dL — ABNORMAL LOW (ref 13.0–17.0)
Potassium: 4 mmol/L (ref 3.5–5.1)
Sodium: 141 mmol/L (ref 135–145)
TCO2: 22 mmol/L (ref 22–32)

## 2024-11-28 LAB — TROPONIN T, HIGH SENSITIVITY
Troponin T High Sensitivity: 38 ng/L — ABNORMAL HIGH (ref 0–19)
Troponin T High Sensitivity: 39 ng/L — ABNORMAL HIGH (ref 0–19)

## 2024-11-28 LAB — CBG MONITORING, ED
Glucose-Capillary: 90 mg/dL (ref 70–99)
Glucose-Capillary: 92 mg/dL (ref 70–99)

## 2024-11-28 LAB — ETHANOL: Alcohol, Ethyl (B): <15 mg/dL

## 2024-11-28 LAB — I-STAT CG4 LACTIC ACID, ED: Lactic Acid, Venous: 1 mmol/L (ref 0.5–1.9)

## 2024-11-28 MED ORDER — ACETAMINOPHEN 650 MG RE SUPP: 650.0000 mg | Freq: Four times a day (QID) | RECTAL | Status: DC | PRN

## 2024-11-28 MED ORDER — ONDANSETRON HCL 4 MG/2ML IJ SOLN: 4.0000 mg | Freq: Four times a day (QID) | INTRAMUSCULAR | Status: DC | PRN

## 2024-11-28 MED ORDER — RIVAROXABAN 20 MG PO TABS
20.0000 mg | ORAL_TABLET | Freq: Every day | ORAL | Status: DC
  Administered 2024-11-29 – 2024-11-30 (×2): 20 mg via ORAL
  Filled 2024-11-28: qty 2
  Filled 2024-11-28: qty 1

## 2024-11-28 MED ORDER — ACETAMINOPHEN 325 MG PO TABS
650.0000 mg | ORAL_TABLET | Freq: Four times a day (QID) | ORAL | Status: DC | PRN
  Administered 2024-12-01: 650 mg via ORAL
  Filled 2024-11-28: qty 2

## 2024-11-28 MED ORDER — MELATONIN 3 MG PO TABS
3.0000 mg | ORAL_TABLET | Freq: Every evening | ORAL | Status: DC | PRN
  Administered 2024-11-30: 3 mg via ORAL
  Filled 2024-11-28: qty 1

## 2024-11-28 MED ORDER — SODIUM CHLORIDE 0.9 % IV BOLUS
1000.0000 mL | Freq: Once | INTRAVENOUS | Status: AC
  Administered 2024-11-28: 1000 mL via INTRAVENOUS

## 2024-11-28 NOTE — ED Triage Notes (Signed)
 Quick triage note : Pt to ED accompanied with wife and children c/o increased confusion, reports increase confusion x 1 week, progressively getting worse.  Per family over the past two days pt has had difficulty performing daily task,( trying to turn tv on with cell phone etc). Family also reports slower gait x 2 months.  Currently ambulatory with cane

## 2024-11-28 NOTE — ED Provider Notes (Signed)
 " [place] [facility] Provider Note   CSN: [id number] Arrival date & time: 11/28/24  1807     Patient presents with: Altered Mental Status   Ricky Hood is a 78 y.o. male.   78 year old male with prior medical history as detailed below presents for evaluation.  Patient with increased level of confusion x 2 days.  Patient's family reports that he is increasingly confused.  He apparently was trying to operate the TV with his cell phone.  He is having some difficulty walking with unsteady gait reported.  No reported fever.  No reported cough or congestion.  No pain reported.  Patient is pleasant and appropriately oriented at time of evaluation.  Prior medical history includes hypertension, hyperlipidemia, prostate enlargement, h/o retinal vein occlusion, s/p dual chamber pacemaker for pacemaker for syncope (09/2020), paroxysmal Afib on Xarelto .  The history is provided by the patient and medical records.       Prior to Admission medications  Medication Sig Start Date End Date Taking? Authorizing Provider  acetaminophen  (TYLENOL ) 325 MG tablet Take 325-650 mg by mouth daily as needed for headache (pain).    [provider]  ascorbic acid (VITAMIN C) 500 MG tablet Take 500 mg by mouth daily after supper.    [provider]  Cholecalciferol (VITAMIN D3) 50 MCG (2000 UT) TABS Take 2,000 Units by mouth daily after supper.    [provider]  Coenzyme Q10 (COQ10) 50 MG CAPS Take 50 mg by mouth daily.    [provider]  finasteride  (PROSCAR ) 5 MG tablet Take 5 mg by mouth daily after supper.    [provider]  fish oil-omega-3 fatty acids 1000 MG capsule Take 3 g by mouth daily after supper.    [provider]  furosemide  (LASIX ) 20 MG tablet Take 1 tablet (20 mg total) by mouth daily as needed (for swelling). 10/10/24 01/08/25  Revankar, Jennifer SAUNDERS, MD  ibuprofen  (ADVIL ) 600 MG tablet Take 1 tablet (600  mg total) by mouth 3 (three) times daily. 11/24/20   Goodrich, Callie E, PA-C  lisinopril  (ZESTRIL ) 10 MG tablet Take 10 mg by mouth daily after supper.    [provider]  loratadine (CLARITIN) 10 MG tablet Take 10 mg by mouth daily after supper.    [provider]  Multiple Vitamin (MULTIVITAMIN WITH MINERALS) TABS tablet Take 1 tablet by mouth daily after supper.    [provider]  rivaroxaban  (XARELTO ) 20 MG TABS tablet TAKE 1 TABLET BY MOUTH DAILY WITH SUPPER 09/11/24   Revankar, Jennifer SAUNDERS, MD  rosuvastatin  (CRESTOR ) 5 MG tablet Take 5 mg by mouth daily after supper. 07/26/20   [provider]  TART CHERRY PO Take 30 mLs by mouth 2 (two) times daily with a meal.    [provider]    Allergies: Patient has no known allergies.    Review of Systems  All other systems reviewed and are negative.   Updated Vital Signs BP (!) 170/74 (BP Location: Right Arm)   Pulse 70   Temp 99.2 F (37.3 C) (Oral)   Resp 17   SpO2 98%   Physical Exam Vitals and nursing note reviewed.  Constitutional:      General: He is not in acute distress.    Appearance: He is well-developed.  HENT:     Head: Normocephalic and atraumatic.  Eyes:     Conjunctiva/sclera: Conjunctivae normal.  Cardiovascular:     Rate and Rhythm:  Normal rate and regular rhythm.     Heart sounds: No murmur heard. Pulmonary:     Effort: Pulmonary effort is normal. No respiratory distress.     Breath sounds: Normal breath sounds.  Abdominal:     Palpations: Abdomen is soft.     Tenderness: There is no abdominal tenderness.  Musculoskeletal:        General: No swelling.     Cervical back: Neck supple.  Skin:    General: Skin is warm and dry.     Capillary Refill: Capillary refill takes less than 2 seconds.  Neurological:     Mental Status: He is alert.  Psychiatric:        Mood and Affect: Mood normal.     (all labs ordered are listed, but only abnormal results are  displayed) Labs Reviewed  CBC WITH DIFFERENTIAL/PLATELET - Abnormal; Notable for the following components:      Result Value   RBC 4.10 (*)    Abs Immature Granulocytes 0.08 (*)    All other components within normal limits  COMPREHENSIVE METABOLIC PANEL WITH GFR - Abnormal; Notable for the following components:   BUN 27 (*)    All other components within normal limits  URINALYSIS, W/ REFLEX TO CULTURE (INFECTION SUSPECTED) - Abnormal; Notable for the following components:   Color, Urine STRAW (*)    Hgb urine dipstick MODERATE (*)    Leukocytes,Ua SMALL (*)    Bacteria, UA RARE (*)    All other components within normal limits  I-STAT CHEM 8, ED - Abnormal; Notable for the following components:   BUN 27 (*)    Creatinine, Ser 1.40 (*)    Hemoglobin 12.9 (*)    HCT 38.0 (*)    All other components within normal limits  TROPONIN T, HIGH SENSITIVITY - Abnormal; Notable for the following components:   Troponin T High Sensitivity 38 (*)    All other components within normal limits  RESP PANEL BY RT-PCR (RSV, FLU A&B, COVID)  RVPGX2  URINE CULTURE  ETHANOL  URINE DRUG SCREEN  CBC WITH DIFFERENTIAL/PLATELET  COMPREHENSIVE METABOLIC PANEL WITH GFR  MAGNESIUM  MAGNESIUM  CBG MONITORING, ED  CBG MONITORING, ED  I-STAT CG4 LACTIC ACID, ED  TROPONIN T, HIGH SENSITIVITY    EKG: None  Radiology: No results found.   Procedures   Medications Ordered in the ED - No data to display                                  Medical Decision Making Patient presents with family for increased confusion, increased difficulty ambulating.  Patient's family noticed a significant change in mentation and ability to ambulate approximately 48 hours ago.  Initial labs are reassuring.  CT imaging of the brain does not show acute abnormality.  Patient is grossly intact neurologically.  However there is the possibility that he has an occult stroke that is not identified on CT.  Patient with MRI  Conditional PPM.  Will plan to admit for further workup with hospital service.  MRI brain may be of benefit.  This will have to be done as an inpatient.  Hospitalist service is aware of case and will evaluate for admission.  Amount and/or Complexity of Data Reviewed Labs: ordered. Radiology: ordered.  Risk Decision regarding hospitalization.        Final diagnoses:  Altered mental status, unspecified altered mental status type  ED Discharge Orders     None          Laurice Maude BROCKS, MD 11/28/24 2240  "

## 2024-11-28 NOTE — ED Notes (Signed)
 Family is leaving for the night. Pt is resting in bed. No complaints at this time.

## 2024-11-28 NOTE — H&P (Signed)
 " History and Physical      Ricky Hood FMW:[id number] DOB: 1946/04/26 DOA: 11/28/2024; DOS: 11/28/2024  PCP: Gable Cambric, MD *** Patient coming from: home ***  I have personally briefly reviewed patient's old medical records in [facility]  Chief Complaint: ***  HPI: Ricky Hood is a 78 y.o. male with medical history significant for *** who is admitted to [facility] on 11/28/2024 with *** after presenting from home*** to [facility] ED complaining of ***.    ***       ***   ED Course:  Vital signs in the ED were notable for the following: ***  Labs were notable for the following: ***  Per my interpretation, EKG in ED demonstrated the following:  ***  Imaging in the ED, per corresponding formal radiology read, was notable for the following:  ***  While in the ED, the following were administered: ***  Subsequently, the patient was admitted  ***  ***red    Review of Systems: As per HPI otherwise 10 point review of systems negative.   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 He has never used smokeless tobacco. He reports that he does not drink alcohol and does not use drugs.   Allergies[1]  Family History  Problem Relation Age of Onset   Hypertension Mother    Hyperlipidemia Mother    Other Mother        Carotid endarterectomy   Hypertension Father    Hyperlipidemia Father     Family history reviewed and not pertinent ***   Prior to Admission medications  Medication Sig Start Date End Date Taking? Authorizing Provider  acetaminophen  (TYLENOL ) 325 MG tablet Take 325-650 mg by mouth daily as needed for headache (pain).    [provider]  ascorbic acid (VITAMIN C) 500 MG tablet Take 500 mg by mouth daily after supper.    [provider]  Cholecalciferol (VITAMIN D3) 50 MCG (2000 UT) TABS Take 2,000 Units by mouth daily after supper.    [provider]  Coenzyme Q10 (COQ10) 50 MG CAPS Take 50 mg by mouth daily.    [provider]  finasteride  (PROSCAR ) 5 MG tablet Take 5 mg by mouth daily after supper.    [provider]  fish oil-omega-3 fatty acids 1000 MG capsule Take 3 g by mouth daily after supper.    [provider]  furosemide  (LASIX ) 20 MG tablet Take 1 tablet (20 mg total) by mouth daily as needed (for swelling). 10/10/24 01/08/25  Revankar, Jennifer SAUNDERS,  MD  ibuprofen  (ADVIL ) 600 MG tablet Take 1 tablet (600 mg total) by mouth 3 (three) times daily. 11/24/20   Goodrich, Callie E, PA-C  lisinopril  (ZESTRIL ) 10 MG tablet Take 10 mg by mouth daily after supper.    [provider]  loratadine (CLARITIN) 10 MG tablet Take 10 mg by mouth daily after supper.    [provider]  Multiple Vitamin (MULTIVITAMIN WITH MINERALS) TABS tablet Take 1 tablet by mouth daily after supper.    [provider]  rivaroxaban  (XARELTO ) 20 MG TABS tablet TAKE 1 TABLET BY MOUTH DAILY WITH SUPPER 09/11/24   Revankar, Jennifer SAUNDERS, MD  rosuvastatin  (CRESTOR ) 5 MG tablet Take 5 mg by mouth daily after  supper. 07/26/20   [provider]  TART CHERRY PO Take 30 mLs by mouth 2 (two) times daily with a meal.    [provider]     Objective    Physical Exam: Vitals:   11/28/24 1930 11/28/24 1955 11/28/24 2000 11/28/24 2030  BP: (!) 152/68  (!) 156/73 (!) 160/67  Pulse: 63  62 66  Resp: (!) 21  (!) 21 20  Temp:    99.1 F (37.3 C)  TempSrc:      SpO2: 97% 99% 96% 98%    General: appears to be stated age; alert, oriented Skin: warm, dry, no rash Head:  AT/[place] Mouth:  Oral mucosa membranes appear moist, normal dentition Neck: supple; trachea midline Heart:  RRR; did not appreciate any M/R/G Lungs: CTAB, did not appreciate any wheezes, rales, or rhonchi Abdomen: + BS; soft, ND, NT Vascular: 2+ pedal pulses b/l; 2+ radial pulses b/l Extremities: no peripheral edema, no muscle wasting   ***   *** Neuro: strength and sensation intact in upper and lower extremities b/l  *** Neuro: 5/5 strength of the proximal and distal flexors and extensors of the upper and lower extremities bilaterally; sensation intact in upper and lower extremities b/l; cranial nerves II through XII grossly intact; no pronator drift; no evidence suggestive of slurred speech, dysarthria, or facial droop; Normal muscle tone. No tremors. *** Neuro: In the setting of the patient's current mental status and associated inability to follow instructions, unable to perform full neurologic exam at this time.  As such, assessment of strength, sensation, and cranial nerves is limited at this time. Patient noted to spontaneously move all 4 extremities. No tremors.  ***        Labs on Admission: I have personally reviewed following labs and imaging studies  CBC: Recent Labs  Lab 11/28/24 2000 11/28/24 2021  WBC 9.7  --   NEUTROABS 7.0  --   HGB 13.3 12.9*  HCT 39.1 38.0*  MCV 95.4  --   PLT 222  --    Basic Metabolic Panel: Recent Labs  Lab 11/28/24 2000 11/28/24 2021  NA 141 141  K 4.1  4.0  CL 106 106  CO2 23  --   GLUCOSE 92 91  BUN 27* 27*  CREATININE 1.23 1.40*  CALCIUM  9.2  --    GFR: CrCl cannot be calculated (Unknown ideal weight.). Liver Function Tests: Recent Labs  Lab 11/28/24 2000  AST 19  ALT 15  ALKPHOS 78  BILITOT 0.4  PROT 7.0  ALBUMIN 3.7   No results for input(s): LIPASE, AMYLASE in the last 168 hours. No results for input(s): AMMONIA in the last 168 hours. Coagulation Profile: No results for input(s): INR, PROTIME in the last 168 hours. Cardiac Enzymes: No  results for input(s): CKTOTAL, CKMB, CKMBINDEX, TROPONINI in the last 168 hours. BNP (last 3 results) No results for input(s): PROBNP in the last 8760 hours. HbA1C: No results for input(s): HGBA1C in the last 72 hours. CBG: Recent Labs  Lab 11/28/24 1823 11/28/24 1900  GLUCAP 90 92   Lipid Profile: No results for input(s): CHOL, HDL, LDLCALC, TRIG, CHOLHDL, LDLDIRECT in the last 72 hours. Thyroid  Function Tests: No results for input(s): TSH, T4TOTAL, FREET4, T3FREE, THYROIDAB in the last 72 hours. Anemia Panel: No results for input(s): VITAMINB12, FOLATE, FERRITIN, TIBC, IRON, RETICCTPCT in the last 72 hours. Urine analysis:    Component Value Date/Time   COLORURINE STRAW (A) 11/28/2024 2120   APPEARANCEUR CLEAR 11/28/2024 2120   LABSPEC 1.009 11/28/2024 2120   PHURINE 5.0 11/28/2024 2120   GLUCOSEU NEGATIVE 11/28/2024 2120   HGBUR MODERATE (A) 11/28/2024 2120   BILIRUBINUR NEGATIVE 11/28/2024 2120   KETONESUR NEGATIVE 11/28/2024 2120   PROTEINUR NEGATIVE 11/28/2024 2120   NITRITE NEGATIVE 11/28/2024 2120   LEUKOCYTESUR SMALL (A) 11/28/2024 2120    Radiological Exams on Admission: CT Head Wo Contrast Result Date: 11/28/2024 EXAM: CT HEAD WITHOUT CONTRAST 11/28/2024 09:05:00 PM TECHNIQUE: CT of the head was performed without the administration of intravenous contrast. Automated exposure control, iterative  reconstruction, and/or weight based adjustment of the mA/kV was utilized to reduce the radiation dose to as low as reasonably achievable. COMPARISON: None available. CLINICAL HISTORY: Mental status change, unknown cause. FINDINGS: BRAIN AND VENTRICLES: No acute hemorrhage. No evidence of acute infarct. No hydrocephalus. No extra-axial collection. No mass effect or midline shift. Patchy white matter hypodensities, compatible with chronic microvascular ischemic disease. Cerebral atrophy. Calcific atherosclerosis. ORBITS: No acute abnormality. SINUSES: Mild paranasal sinus mucosal thickening. SOFT TISSUES AND SKULL: No acute soft tissue abnormality. No skull fracture. IMPRESSION: 1. No acute intracranial abnormality. 2. Patchy white matter hypodensities, compatible with chronic microvascular ischemic disease. 3. Cerebral atrophy. Electronically signed by: Franky Stanford MD 11/28/2024 09:34 PM EST RP Workstation: [id number]   DG Chest Port 1 View Result Date: 11/28/2024 EXAM: 1 VIEW(S) XRAY OF THE CHEST 11/28/2024 07:49:00 PM COMPARISON: Chest x ray 11/24/2020. CLINICAL HISTORY: weakness FINDINGS: LINES, TUBES AND DEVICES: Left subclavian dual lead pacemaker in place. LUNGS AND PLEURA: Low lung volumes. Calcified granuloma at right lung base. No pleural effusion. No pneumothorax. HEART AND MEDIASTINUM: Cardiomegaly. Calcified aorta. Left subclavian dual lead pacemaker in place. BONES AND SOFT TISSUES: Degenerative changes of bilateral shoulders, left greater than right. No acute osseous abnormality. IMPRESSION: 1. No acute cardiopulmonary abnormality. Electronically signed by: Greig Pique MD 11/28/2024 09:04 PM EST RP Workstation: [id number]      Assessment/Plan   Principal Problem:   Acute  encephalopathy   ***            ***                     ***                      ***                     ***                     ***                     ***                      ***                     ***                     ***                     ***                     ***                    ***                   ***  DVT prophylaxis: SCD's ***  Code Status: Full code*** Family Communication: none*** Disposition Plan: Per Rounding Team Consults called: none***;  Admission status: ***     I SPENT GREATER THAN 75 *** MINUTES IN CLINICAL CARE TIME/MEDICAL DECISION-MAKING IN COMPLETING THIS ADMISSION.      Eva NOVAK Sisto Granillo DO [facility]  From 7PM - 7AM   11/28/2024, 10:41 PM   ***     [1] No Known Allergies  "

## 2024-11-28 NOTE — ED Triage Notes (Signed)
 Pt also has lower back pain for the last 6 months as well.

## 2024-11-29 ENCOUNTER — Observation Stay (HOSPITAL_COMMUNITY)

## 2024-11-29 ENCOUNTER — Encounter (HOSPITAL_COMMUNITY): Payer: Self-pay | Admitting: Internal Medicine

## 2024-11-29 ENCOUNTER — Observation Stay (HOSPITAL_BASED_OUTPATIENT_CLINIC_OR_DEPARTMENT_OTHER)

## 2024-11-29 DIAGNOSIS — I361 Nonrheumatic tricuspid (valve) insufficiency: Secondary | ICD-10-CM | POA: Diagnosis not present

## 2024-11-29 DIAGNOSIS — R7989 Other specified abnormal findings of blood chemistry: Secondary | ICD-10-CM | POA: Diagnosis present

## 2024-11-29 DIAGNOSIS — G9340 Encephalopathy, unspecified: Secondary | ICD-10-CM | POA: Diagnosis not present

## 2024-11-29 DIAGNOSIS — Z8679 Personal history of other diseases of the circulatory system: Secondary | ICD-10-CM

## 2024-11-29 DIAGNOSIS — N400 Enlarged prostate without lower urinary tract symptoms: Secondary | ICD-10-CM | POA: Diagnosis present

## 2024-11-29 DIAGNOSIS — E860 Dehydration: Secondary | ICD-10-CM | POA: Diagnosis present

## 2024-11-29 DIAGNOSIS — N19 Unspecified kidney failure: Secondary | ICD-10-CM | POA: Diagnosis present

## 2024-11-29 LAB — PROCALCITONIN: Procalcitonin: <0.1 ng/mL

## 2024-11-29 LAB — BLOOD GAS, VENOUS
Acid-Base Excess: 0 mmol/L (ref 0.0–2.0)
Bicarbonate: 24.8 mmol/L (ref 20.0–28.0)
O2 Saturation: 82.8 %
Patient temperature: 37
pCO2, Ven: 40 mmHg — ABNORMAL LOW (ref 44–60)
pH, Ven: 7.4 (ref 7.25–7.43)
pO2, Ven: 51 mmHg — ABNORMAL HIGH (ref 32–45)

## 2024-11-29 LAB — CBC WITH DIFFERENTIAL/PLATELET
Abs Immature Granulocytes: 0.06 K/uL (ref 0.00–0.07)
Basophils Absolute: 0 K/uL (ref 0.0–0.1)
Basophils Relative: 1 %
Eosinophils Absolute: 0.1 K/uL (ref 0.0–0.5)
Eosinophils Relative: 1 %
HCT: 36.8 % — ABNORMAL LOW (ref 39.0–52.0)
Hemoglobin: 12.4 g/dL — ABNORMAL LOW (ref 13.0–17.0)
Immature Granulocytes: 1 %
Lymphocytes Relative: 18 %
Lymphs Abs: 1.4 K/uL (ref 0.7–4.0)
MCH: 32.3 pg (ref 26.0–34.0)
MCHC: 33.7 g/dL (ref 30.0–36.0)
MCV: 95.8 fL (ref 80.0–100.0)
Monocytes Absolute: 0.9 K/uL (ref 0.1–1.0)
Monocytes Relative: 12 %
Neutro Abs: 5.4 K/uL (ref 1.7–7.7)
Neutrophils Relative %: 67 %
Platelets: 213 K/uL (ref 150–400)
RBC: 3.84 MIL/uL — ABNORMAL LOW (ref 4.22–5.81)
RDW: 12.7 % (ref 11.5–15.5)
WBC: 7.8 K/uL (ref 4.0–10.5)
nRBC: 0 % (ref 0.0–0.2)

## 2024-11-29 LAB — COMPREHENSIVE METABOLIC PANEL WITH GFR
ALT: 14 U/L (ref 0–44)
AST: 18 U/L (ref 15–41)
Albumin: 3.5 g/dL (ref 3.5–5.0)
Alkaline Phosphatase: 74 U/L (ref 38–126)
Anion gap: 11 (ref 5–15)
BUN: 25 mg/dL — ABNORMAL HIGH (ref 8–23)
CO2: 23 mmol/L (ref 22–32)
Calcium: 8.8 mg/dL — ABNORMAL LOW (ref 8.9–10.3)
Chloride: 109 mmol/L (ref 98–111)
Creatinine, Ser: 1.13 mg/dL (ref 0.61–1.24)
GFR, Estimated: >60 mL/min
Glucose, Bld: 98 mg/dL (ref 70–99)
Potassium: 4.1 mmol/L (ref 3.5–5.1)
Sodium: 142 mmol/L (ref 135–145)
Total Bilirubin: 0.4 mg/dL (ref 0.0–1.2)
Total Protein: 6.6 g/dL (ref 6.5–8.1)

## 2024-11-29 LAB — MAGNESIUM
Magnesium: 2.1 mg/dL (ref 1.7–2.4)
Magnesium: 2.1 mg/dL (ref 1.7–2.4)

## 2024-11-29 LAB — ECHOCARDIOGRAM COMPLETE
AR max vel: 2.2 cm2
AV Area VTI: 2.33 cm2
AV Area mean vel: 2.44 cm2
AV Mean grad: 8 mmHg
AV Peak grad: 17.1 mmHg
Ao pk vel: 2.07 m/s
Area-P 1/2: 3.27 cm2
Calc EF: 61.2 %
S' Lateral: 3.4 cm
Single Plane A2C EF: 55.9 %
Single Plane A4C EF: 63.7 %

## 2024-11-29 LAB — CK: Total CK: 106 U/L (ref 49–397)

## 2024-11-29 LAB — TROPONIN T, HIGH SENSITIVITY: Troponin T High Sensitivity: 40 ng/L — ABNORMAL HIGH (ref 0–19)

## 2024-11-29 LAB — TSH: TSH: 2.9 u[IU]/mL (ref 0.350–4.500)

## 2024-11-29 LAB — VITAMIN B12: Vitamin B-12: 566 pg/mL (ref 180–914)

## 2024-11-29 LAB — AMMONIA: Ammonia: 23 umol/L (ref 9–35)

## 2024-11-29 MED ORDER — SODIUM CHLORIDE 0.9 % IV SOLN: INTRAVENOUS | Status: AC

## 2024-11-29 MED ORDER — AMLODIPINE BESYLATE 5 MG PO TABS
10.0000 mg | ORAL_TABLET | Freq: Every day | ORAL | Status: DC
  Administered 2024-11-29 – 2024-12-01 (×3): 10 mg via ORAL
  Filled 2024-11-29 (×3): qty 2

## 2024-11-29 MED ORDER — LISINOPRIL 10 MG PO TABS: 10.0000 mg | ORAL_TABLET | Freq: Every day | ORAL | Status: DC

## 2024-11-29 MED ORDER — ROSUVASTATIN CALCIUM 5 MG PO TABS
5.0000 mg | ORAL_TABLET | Freq: Every day | ORAL | Status: DC
  Administered 2024-11-29 – 2024-11-30 (×2): 5 mg via ORAL
  Filled 2024-11-29 (×2): qty 1

## 2024-11-29 MED ORDER — HYDRALAZINE HCL 20 MG/ML IJ SOLN
5.0000 mg | Freq: Four times a day (QID) | INTRAMUSCULAR | Status: DC | PRN
  Filled 2024-11-29: qty 1

## 2024-11-29 MED ORDER — IBUPROFEN 200 MG PO TABS
400.0000 mg | ORAL_TABLET | Freq: Three times a day (TID) | ORAL | Status: DC
  Administered 2024-11-29 – 2024-12-01 (×6): 400 mg via ORAL
  Filled 2024-11-29 (×3): qty 2
  Filled 2024-11-29: qty 1
  Filled 2024-11-29: qty 2
  Filled 2024-11-29: qty 1

## 2024-11-29 MED ORDER — FINASTERIDE 5 MG PO TABS
5.0000 mg | ORAL_TABLET | Freq: Every day | ORAL | Status: DC
  Administered 2024-11-29 – 2024-11-30 (×2): 5 mg via ORAL
  Filled 2024-11-29 (×2): qty 1

## 2024-11-29 MED ORDER — OXYCODONE HCL 5 MG PO TABS
5.0000 mg | ORAL_TABLET | ORAL | Status: DC | PRN
  Administered 2024-11-29 – 2024-12-01 (×4): 5 mg via ORAL
  Filled 2024-11-29 (×5): qty 1

## 2024-11-29 NOTE — CV Procedure (Signed)
" °  Device system confirmed to be MRI conditional, with implant date > 6 weeks ago, and no evidence of abandoned or epicardial leads in review of most recent CXR  Device last cleared by EP Provider: Charlies Arthur 11/29/24  Clearance is good through for 1 year as long as parameters remain stable at time of check. If pt undergoes a cardiac device procedure during that time, they should be re-cleared.   Tachy-therapies to be programmed off if applicable with device back to pre-MRI settings after completion of exam.  [employer] will be present for programming for the MRI.   Rocky Catalan, RT  11/29/2024 7:41 AM     "

## 2024-11-29 NOTE — Progress Notes (Signed)
 [facility]  Taft Spiegel FMW:[id number]  DOB: [birth year]  DOA: 11/28/2024  PCP: Gable Cambric, MD  11/29/2024,6:50 AM  LOS: 0 days    Code Status: Full code     from: Home   78 year old male Ascending aortic aneurysm without rupture Paroxysmal A-fib diagnosed 10/2020 on flecainide  complete heart block with PPM Saint Jude medical Assurity MRI conditional---  Previous pericardial fusion 2.6 cm last EF 01/06/2021 = grade 2 diastolic dysfunction 50-55% EF  Worked up and felt to be secondary to pericarditis treated with colchicine  for 3 months  Also had a left pleural effusion 11/24/2020 drained in the hospital as well felt to be secondary to heart failure?  12/30 present from home slightly more confused-unable to make television work and was trying to use his phone to make it work instead of using the remote control    Assessment  & Plan :    Toxic metabolic encephalopathy-DDx stroke causing confusion  TSH B12 mag Calcitonin CK ammonia-all negative except for low-grade troponin elevation 39-40 DDx AKI causing confusion versus UTI although unlikely only small leukocytes on UA and rare bacteria MR brain ordered and pending  AKI on admission Baseline BUN/creatinine 21/1.1-on arrival here 27/1.4 no acidosis Saline bolus given, continue 40 cc/H Hold home medication Lasix  20 as needed, lisinopril  10, ibuprofen  600 3 times daily which was scheduled  Uncontrolled HTN Add amlodipine 10, as needed for blood pressure hydralazine  if the MRI does not show any stroke Otherwise  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% 2022 Underlying history of idiopathic pericarditis status posttreatment Previous left pleural effusion probably secondary to acute heart failure in the remote past Meds adjusted as above Monitor trends  A-fib CHADVASC >4 Complete heart block with Saint Jude MR conditional PPM Keep on telemetry monitor  Low-grade temp Watch closely temperatures at 99   Data Reviewed today: Sodium 142 potassium 4.1  chloride 109 BUN/creatinine 25/1.1 LFTs normal troponin 40 procalcitonin less than 0.1 WBC 7.8 hemoglobin 12.5   DVT prophylaxis: Xarelto    Dispo/Global plan: Inpatient     Subjective:   Awake coherent alert no distress cannot tell me date time year but tells me that his son thinks he has been confused over the past several weeks He is usually high functioning and is able to move all 4 limbs at the bedside on my exam He does not seem to be in any distress  Objective + exam Vitals:   11/29/24 0000 11/29/24 0026 11/29/24 0347 11/29/24 0354  BP: (!) 140/80  (!) 171/84   Pulse: 73  73   Resp: 19  17   Temp:  99 F (37.2 C)  99.2 F (37.3 C)  TempSrc:  Oral    SpO2: 93%  98%    There were no vitals filed for this visit.   Examination: EOMI NCAT no focal deficit dry mucosa no icterus no pallor Finger-nose-finger intact Power 5/5 Reflexes 2/3 Straight leg raise bilaterally equal Power 5/5  Scheduled Meds:  amLODipine  10 mg Oral Daily   finasteride   5 mg Oral QPC supper   rivaroxaban   20 mg Oral Q supper   rosuvastatin   5 mg Oral QPC supper   Continuous Infusions: acetaminophen  **OR** acetaminophen , melatonin, ondansetron  (ZOFRAN ) IV  Time 46  Jai-Gurmukh Vicki Chaffin, MD  [facility]

## 2024-11-29 NOTE — ED Notes (Signed)
 Family called out of room after the pt returned from xray of lower back. Pt laying in bed groaning and having full body tremors. Pt was tearful about back pain. Assisted pt to sit up slightly in bed to try to alleviate pressure on lower back. Pt stated that position change helped with pain. Will reassess in 15 mins.

## 2024-11-29 NOTE — Care Management Obs Status (Signed)
 MEDICARE OBSERVATION STATUS NOTIFICATION   Patient Details  Name: Ricky Hood MRN: [id number] Date of Birth: [birth year]   Medicare Observation Status Notification Given:  Yes    Camelia JONETTA Cary, RN 11/29/2024, 2:53 PM

## 2024-11-29 NOTE — Evaluation (Signed)
 Occupational Therapy Evaluation Patient Details Name: Ricky Hood MRN: [id number] DOB: [birth year] Today's Date: 11/29/2024   History of Present Illness   Pt is a 78 year old man who presented on12/30/25 with increasing confusion and generalized weakness.MRI negative for acute changes, + chronic microvascular disease and cerebral atrophy.  PMH: CHF, HTN, prostate inlargement, retinal vein occlusion, PCM, PAF, low back pain.     Clinical Impressions Pt typically walks with a cane and is modified independent in self care, sitting to shower, and light meal prep. He drives. Pt is currently oriented. He is rather [facility], difficult to accurately assess cognition, will need formal cognitive screening. Pt presents with generalized weakness, chronic pain he describes in his buttocks and impaired standing balance. Difficult to assess LB ADLs due to height of stretcher. Will follow acutely. Recommend HHOT.     If plan is discharge home, recommend the following:   A little help with walking and/or transfers;A little help with bathing/dressing/bathroom;Assistance with cooking/housework;Assist for transportation;Help with stairs or ramp for entrance     Functional Status Assessment   Patient has had a recent decline in their functional status and demonstrates the ability to make significant improvements in function in a reasonable and predictable amount of time.     Equipment Recommendations   None recommended by OT     Recommendations for Other Services         Precautions/Restrictions   Precautions Precautions: Fall     Mobility Bed Mobility Overal bed mobility: Needs Assistance Bed Mobility: Supine to Sit, Sit to Supine     Supine to sit: Min assist, HOB elevated Sit to supine: Min assist   General bed mobility comments: assist to raise trunk and for LEs back into bed    Transfers Overall transfer level: Needs assistance Equipment used: Rollator (4 wheels) Transfers:  Sit to/from Stand Sit to Stand: Contact guard assist                  Balance Overall balance assessment: Needs assistance   Sitting balance-Leahy Scale: Fair       Standing balance-Leahy Scale: Poor Standing balance comment: reliant on hand held assist vs B UE support for ambulation                           ADL either performed or assessed with clinical judgement   ADL Overall ADL's : Needs assistance/impaired Eating/Feeding: Set up;Bed level   Grooming: Contact guard assist;Standing   Upper Body Bathing: Sitting;Supervision/ safety       Upper Body Dressing : Sitting;Minimal assistance       Toilet Transfer: Contact guard assist;Ambulation;Rollator (4 wheels)           Functional mobility during ADLs: Contact guard assist;Rollator (4 wheels);Minimal assistance General ADL Comments: unable to assess LB ADLs due to height of stretcher, pt typically bends over to reah feet     Vision Baseline Vision/History: 1 Wears glasses Ability to See in Adequate Light: 0 Adequate Patient Visual Report: No change from baseline       Perception Perception: Not tested       Praxis Praxis: Not tested       Pertinent Vitals/Pain Pain Assessment Pain Assessment: Faces Faces Pain Scale: Hurts little more Pain Location: buttocks with ambulation Pain Descriptors / Indicators: Aching Pain Intervention(s): Monitored during session, Repositioned     Extremity/Trunk Assessment Upper Extremity Assessment Upper Extremity Assessment: Right hand dominant;Overall [facility] for tasks assessed  Lower Extremity Assessment Lower Extremity Assessment: Defer to PT evaluation   Cervical / Trunk Assessment Cervical / Trunk Assessment: Other exceptions (chronic back pain)   Communication Communication Communication: Impaired Factors Affecting Communication: Hearing impaired;Other (comment) (daughter reports change to speech clarity)   Cognition Arousal:  Alert Behavior During Therapy: WFL for tasks assessed/performed Cognition: Difficult to assess Difficult to assess due to: Hard of hearing/deaf           OT - Cognition Comments: pt is oriented, slow to respond to questions with hearing impairment, family frequently answering questions for him, needs further evaluation                 Following commands: Intact       Cueing  General Comments   Cueing Techniques: Verbal cues      Exercises     Shoulder Instructions      Home Living Family/patient expects to be discharged to:: Private residence Living Arrangements: Spouse/significant other;Children;Other relatives (daughter, son in law and grandchildren) Available Help at Discharge: Family;Available 24 hours/day Type of Home: House Home Access: Ramped entrance     Home Layout: Multi-level;Able to live on main level with bedroom/bathroom     Bathroom Shower/Tub: Producer, Television/film/video: Handicapped height     Home Equipment: Cane - single point;Shower seat;Hand held Stage Manager (4 wheels)          Prior Functioning/Environment Prior Level of Function : Independent/Modified Independent;Driving             Mobility Comments: Uses cane ADLs Comments: Modified independent with ADLs and light meal prep, assisted for housekeeping    OT Problem List: Decreased strength;Impaired balance (sitting and/or standing);Decreased knowledge of use of DME or AE;Pain   OT Treatment/Interventions: Self-care/ADL training;DME and/or AE instruction;Therapeutic activities;Patient/family education;Balance training      OT Goals(Current goals can be found in the care plan section)   Acute Rehab OT Goals OT Goal Formulation: With patient Time For Goal Achievement: 12/14/24 Potential to Achieve Goals: Good ADL Goals Pt Will Perform Grooming: with supervision;standing Pt Will Perform Lower Body Bathing: with supervision;with adaptive equipment;sit  to/from stand Pt Will Perform Lower Body Dressing: with adaptive equipment Pt Will Transfer to Toilet: with supervision;ambulating;regular height toilet Pt Will Perform Toileting - Clothing Manipulation and hygiene: with supervision;sit to/from stand Additional ADL Goal #1: Pt will complete bed mobility using log roll technique. Additional ADL Goal #2: Pt will participate in formal cognitive screening.   OT Frequency:  Min 2X/week    Co-evaluation              AM-PAC OT 6 Clicks Daily Activity     Outcome Measure Help from another person eating meals?: None Help from another person taking care of personal grooming?: A Little Help from another person toileting, which includes using toliet, bedpan, or urinal?: A Little Help from another person bathing (including washing, rinsing, drying)?: A Little Help from another person to put on and taking off regular upper body clothing?: A Little Help from another person to put on and taking off regular lower body clothing?: A Little 6 Click Score: 19   End of Session Equipment Utilized During Treatment: Gait belt;Rolling walker (2 wheels)  Activity Tolerance: Patient tolerated treatment well Patient left: in bed;with call bell/phone within reach;with family/visitor present  OT Visit Diagnosis: Unsteadiness on feet (R26.81);Other abnormalities of gait and mobility (R26.89);Muscle weakness (generalized) (M62.81);Pain  Time: 1005-1030 OT Time Calculation (min): 25 min Charges:  OT General Charges $OT Visit: 1 Visit OT Evaluation $OT Eval Moderate Complexity: 1 Mod  Mliss HERO, OTR/L Acute Rehabilitation Services Office: [phone]   Kennth Mliss Helling 11/29/2024, 10:49 AM

## 2024-11-29 NOTE — Evaluation (Signed)
 Physical Therapy Evaluation Patient Details Name: Ricky Hood MRN: [id number] DOB: Jan 27, 1946 Today's Date: 11/29/2024  History of Present Illness  Pt is a 78 year old man who presented on12/30/25 with increasing confusion and generalized weakness.MRI negative for acute changes, + chronic microvascular disease and cerebral atrophy.  PMH: CHF, HTN, prostate inlargement, retinal vein occlusion, PCM, PAF, low back pain.  Clinical Impression  Pt admitted with above diagnosis and presents to PT with functional limitations due to deficits listed below (See PT problem list). Pt needs skilled PT to maximize independence and safety. Pt appears to not be too far from baseline with mobility. Using rollator improved gait due to improvement in lt buttock pain. Can benefit from HHPT at dc.           If plan is discharge home, recommend the following: A little help with walking and/or transfers;A little help with bathing/dressing/bathroom;Assistance with cooking/housework;Assist for transportation   Can travel by private vehicle        Equipment Recommendations None recommended by PT  Recommendations for Other Services       Functional Status Assessment Patient has had a recent decline in their functional status and demonstrates the ability to make significant improvements in function in a reasonable and predictable amount of time.     Precautions / Restrictions Precautions Precautions: Fall Recall of Precautions/Restrictions: Impaired Restrictions Weight Bearing Restrictions Per Provider Order: No      Mobility  Bed Mobility Overal bed mobility: Needs Assistance Bed Mobility: Supine to Sit, Sit to Supine     Supine to sit: Min assist, HOB elevated Sit to supine: Min assist   General bed mobility comments: assist to raise trunk and for LEs back into bed    Transfers Overall transfer level: Needs assistance Equipment used: Rollator (4 wheels) Transfers: Sit to/from Stand Sit to  Stand: Contact guard assist           General transfer comment: Assist for safety    Ambulation/Gait Ambulation/Gait assistance: Contact guard assist, Min assist Gait Distance (Feet): 150 Feet Assistive device: Rollator (4 wheels), 1 person hand held assist Gait Pattern/deviations: Step-through pattern, Decreased stride length Gait velocity: decr Gait velocity interpretation: 1.31 - 2.62 ft/sec, indicative of limited community ambulator   General Gait Details: CGA with rollator. Min assist with hand held due to lt buttock pain.  Stairs            Wheelchair Mobility     Tilt Bed    Modified Rankin (Stroke Patients Only)       Balance Overall balance assessment: Needs assistance Sitting-balance support: No upper extremity supported Sitting balance-Leahy Scale: Fair     Standing balance support: Single extremity supported, Bilateral upper extremity supported Standing balance-Leahy Scale: Poor Standing balance comment: UE support                             Pertinent Vitals/Pain Pain Assessment Pain Assessment: Faces Faces Pain Scale: Hurts little more Pain Location: buttocks with ambulation Pain Descriptors / Indicators: Aching Pain Intervention(s): Limited activity within patient's tolerance, Monitored during session    Home Living Family/patient expects to be discharged to:: Private residence Living Arrangements: Spouse/significant other;Children;Other relatives (daughter, son in law and grandchildren) Available Help at Discharge: Family;Available 24 hours/day Type of Home: House Home Access: Ramped entrance       Home Layout: Multi-level;Able to live on main level with bedroom/bathroom Home Equipment: [place] - single point;Shower seat;Hand held  shower head;Rollator (4 wheels)      Prior Function Prior Level of Function : Independent/Modified Independent;Driving             Mobility Comments: Uses cane ADLs Comments: Modified  independent with ADLs and light meal prep, assisted for housekeeping     Extremity/Trunk Assessment   Upper Extremity Assessment Upper Extremity Assessment: Defer to OT evaluation    Lower Extremity Assessment Lower Extremity Assessment: Generalized weakness    Cervical / Trunk Assessment Cervical / Trunk Assessment: Other exceptions (chronic back pain)  Communication   Communication Communication: Impaired Factors Affecting Communication: Hearing impaired;Other (comment) (daughter reports change to speech clarity)    Cognition Arousal: Alert Behavior During Therapy: WFL for tasks assessed/performed   PT - Cognitive impairments: Problem solving, Memory                       PT - Cognition Comments: Slow to process Following commands: Intact       Cueing Cueing Techniques: Verbal cues     General Comments      Exercises     Assessment/Plan    PT Assessment Patient needs continued PT services  PT Problem List Decreased strength;Decreased balance;Decreased mobility;Pain       PT Treatment Interventions DME instruction;Gait training;Functional mobility training;Therapeutic activities;Therapeutic exercise;Balance training;Patient/family education    PT Goals (Current goals can be found in the Care Plan section)  Acute Rehab PT Goals Patient Stated Goal: go home PT Goal Formulation: With patient/family Time For Goal Achievement: 12/13/24 Potential to Achieve Goals: Good    Frequency Min 2X/week     Co-evaluation PT/OT/SLP Co-Evaluation/Treatment: Yes Reason for Co-Treatment: Other (comment) (uncertainty of pt performance in ED environment) PT goals addressed during session: Mobility/safety with mobility;Proper use of DME         AM-PAC PT 6 Clicks Mobility  Outcome Measure Help needed turning from your back to your side while in a flat bed without using bedrails?: A Little Help needed moving from lying on your back to sitting on the side of  a flat bed without using bedrails?: A Little Help needed moving to and from a bed to a chair (including a wheelchair)?: A Little Help needed standing up from a chair using your arms (e.g., wheelchair or bedside chair)?: A Little Help needed to walk in hospital room?: A Little Help needed climbing 3-5 steps with a railing? : A Little 6 Click Score: 18    End of Session Equipment Utilized During Treatment: Gait belt Activity Tolerance: Patient tolerated treatment well Patient left: in bed;with call bell/phone within reach;with family/visitor present Nurse Communication: Mobility status PT Visit Diagnosis: Other abnormalities of gait and mobility (R26.89);Muscle weakness (generalized) (M62.81);Pain Pain - Right/Left: Left Pain - part of body:  (buttock)    Time: 8992-8968 PT Time Calculation (min) (ACUTE ONLY): 24 min   Charges:   PT Evaluation $PT Eval Moderate Complexity: 1 Mod   PT General Charges $$ ACUTE PT VISIT: 1 Visit         [facility] PT Acute Rehabilitation Services Office [phone]   Rodgers ORN [facility] 11/29/2024, 3:02 PM

## 2024-11-29 NOTE — Progress Notes (Signed)
 Per wife ---significant lumbago over 6 months noted per wife-also lost his dog of 14 years 3 weeks ago and then a relative passed away as well-mental status/confusion changes over 3 weeks have persisted-has not had imaging of his back for pain has been on Tylenol  and ibuprofen  Will order DG films of the lower back-has been cleared by therapy functionally but will need to workup delirium which may be either secondary to pain or mild depression in the setting of possible poor p.o. intake

## 2024-11-29 NOTE — ED Notes (Signed)
 Pt urinated on self in the bed and refusing to let staff change him. Pt alert and oriented x4. Continuing to take self off of monitor. Bed alarm in place

## 2024-11-29 NOTE — TOC Initial Note (Signed)
 "  Clinical Narrative:                  MOON letter signed and given.   Transition of Care [facility]) - Emergency Department Mini Assessment   Patient Details  Name: Ricky Hood MRN: [id number] Date of Birth: March 15, 1946  Transition of Care [facility]) CM/SW Contact:    Camelia JONETTA Cary, RN Phone Number: 11/29/2024, 2:58 PM   Clinical Narrative:  RNCM spoke to patient/family regarding d/c plans. Pt. Will return home with family transporting. Pt. Has cane and no needs from CM at this time noted. Will continue to follow.   ED Mini Assessment:          Means of departure: Car       Patient Contact and Communications        ,              Choice offered to / list presented to : NA  Admission diagnosis:  Acute encephalopathy [G93.40] Patient Active Problem List   Diagnosis Date Noted   Elevated troponin 11/29/2024   Dehydration 11/29/2024   Acute prerenal azotemia 11/29/2024   History of essential hypertension 11/29/2024   BPH (benign prostatic hyperplasia) 11/29/2024   Acute encephalopathy 11/28/2024   Hypercholesteremia    Second degree AV block    Pericardial effusion 11/24/2020   Paroxysmal atrial fibrillation (HCC) 11/24/2020   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   Aneurysm of ascending aorta 11/24/2020   Hypertension 11/24/2020   HLD (hyperlipidemia) 11/24/2020   Hyponatremia 11/24/2020   Pleural effusion    Acute diastolic heart failure (HCC) 11/15/2020   [employer] MRI conditional  dual-chamber pacemaker for symptomatic complete heart block   09/24/2020   Presence of permanent cardiac pacemaker 09/17/2020   Complete heart block (HCC) 09/16/2020   Hypersomnia 08/10/2014   Obstructive sleep apnea (adult) (pediatric) 08/10/2014   Snoring 08/10/2014   PCP:  Gable Cambric, MD Pharmacy:   [facility] - [place], [place] - [address] [place] [address] Phone: [phone] Fax:  [phone]  [facility] - [place], [place] - [address] [place] [address] Phone: [phone] Fax: [phone]         Patient Goals and CMS Choice     Choice offered to / list presented to : NA      Expected Discharge Plan and Services                                              Prior Living Arrangements/Services                       Activities of Daily Living   ADL Screening (condition at time of admission) Independently performs ADLs?: Yes (appropriate for developmental age) Is the patient deaf or have difficulty hearing?: Yes Does the patient have difficulty seeing, even when wearing glasses/contacts?: No Does the patient have difficulty concentrating, remembering, or making decisions?: Yes  Permission Sought/Granted                  Emotional Assessment              Admission diagnosis:  Acute encephalopathy [G93.40] Patient Active Problem List   Diagnosis Date Noted   Elevated troponin 11/29/2024   Dehydration  11/29/2024   Acute prerenal azotemia 11/29/2024   History of essential hypertension 11/29/2024   BPH (benign prostatic hyperplasia) 11/29/2024   Acute encephalopathy 11/28/2024   Hypercholesteremia    Second degree AV block    Pericardial effusion 11/24/2020   Paroxysmal atrial fibrillation (HCC) 11/24/2020   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   Aneurysm of ascending aorta 11/24/2020   Hypertension 11/24/2020   HLD (hyperlipidemia) 11/24/2020   Hyponatremia 11/24/2020   Pleural effusion    Acute diastolic heart failure (HCC) 11/15/2020   St Jude Medical Assurity MRI conditional  dual-chamber pacemaker for symptomatic complete heart block   09/24/2020   Presence of permanent cardiac pacemaker 09/17/2020   Complete heart block (HCC) 09/16/2020   Hypersomnia 08/10/2014   Obstructive sleep apnea (adult) (pediatric) 08/10/2014   Snoring 08/10/2014   PCP:  Gable Cambric, MD Pharmacy:    [facility] - [place], [place] - [address] [place] [address] Phone: [phone] Fax: [phone]  [facility] - [place], [place] - [address] [place] [address] Phone: [phone] Fax: [phone]     Social Drivers of Health (SDOH) Social History: SDOH Screenings   Food Insecurity: No Food Insecurity (11/29/2024)  Housing: Low Risk (11/29/2024)  Transportation Needs: No Transportation Needs (11/29/2024)  Utilities: Not At Risk (11/29/2024)  Social Connections: Socially Integrated (11/29/2024)  Tobacco Use: Low Risk (11/29/2024)   SDOH Interventions:     Readmission Risk Interventions     No data to display           "

## 2024-11-30 ENCOUNTER — Observation Stay (HOSPITAL_COMMUNITY)

## 2024-11-30 DIAGNOSIS — G9340 Encephalopathy, unspecified: Secondary | ICD-10-CM | POA: Diagnosis not present

## 2024-11-30 LAB — BASIC METABOLIC PANEL WITH GFR
Anion gap: 10 (ref 5–15)
BUN: 21 mg/dL (ref 8–23)
CO2: 24 mmol/L (ref 22–32)
Calcium: 8.9 mg/dL (ref 8.9–10.3)
Chloride: 104 mmol/L (ref 98–111)
Creatinine, Ser: 1.13 mg/dL (ref 0.61–1.24)
GFR, Estimated: >60 mL/min
Glucose, Bld: 113 mg/dL — ABNORMAL HIGH (ref 70–99)
Potassium: 4.1 mmol/L (ref 3.5–5.1)
Sodium: 138 mmol/L (ref 135–145)

## 2024-11-30 LAB — CBC WITH DIFFERENTIAL/PLATELET
Abs Immature Granulocytes: 0.06 K/uL (ref 0.00–0.07)
Basophils Absolute: 0 K/uL (ref 0.0–0.1)
Basophils Relative: 0 %
Eosinophils Absolute: 0.1 K/uL (ref 0.0–0.5)
Eosinophils Relative: 1 %
HCT: 35.9 % — ABNORMAL LOW (ref 39.0–52.0)
Hemoglobin: 12.2 g/dL — ABNORMAL LOW (ref 13.0–17.0)
Immature Granulocytes: 1 %
Lymphocytes Relative: 19 %
Lymphs Abs: 2 K/uL (ref 0.7–4.0)
MCH: 32.4 pg (ref 26.0–34.0)
MCHC: 34 g/dL (ref 30.0–36.0)
MCV: 95.5 fL (ref 80.0–100.0)
Monocytes Absolute: 1.1 K/uL — ABNORMAL HIGH (ref 0.1–1.0)
Monocytes Relative: 11 %
Neutro Abs: 7.1 K/uL (ref 1.7–7.7)
Neutrophils Relative %: 68 %
Platelets: 208 K/uL (ref 150–400)
RBC: 3.76 MIL/uL — ABNORMAL LOW (ref 4.22–5.81)
RDW: 12.5 % (ref 11.5–15.5)
WBC: 10.4 K/uL (ref 4.0–10.5)
nRBC: 0 % (ref 0.0–0.2)

## 2024-11-30 MED ORDER — SODIUM CHLORIDE 0.9 % IV SOLN: INTRAVENOUS | Status: DC

## 2024-11-30 MED ORDER — METHYLPREDNISOLONE 4 MG PO TBPK: 4.0000 mg | ORAL_TABLET | Freq: Four times a day (QID) | ORAL | Status: DC

## 2024-11-30 MED ORDER — METHYLPREDNISOLONE 4 MG PO TBPK: 8.0000 mg | ORAL_TABLET | Freq: Every evening | ORAL | Status: DC

## 2024-11-30 MED ORDER — METHYLPREDNISOLONE 4 MG PO TBPK
4.0000 mg | ORAL_TABLET | ORAL | Status: AC
  Administered 2024-11-30: 4 mg via ORAL

## 2024-11-30 MED ORDER — METHYLPREDNISOLONE 4 MG PO TBPK
8.0000 mg | ORAL_TABLET | Freq: Every morning | ORAL | Status: AC
  Administered 2024-11-30: 8 mg via ORAL
  Filled 2024-11-30: qty 21

## 2024-11-30 MED ORDER — METHYLPREDNISOLONE 4 MG PO TBPK: 4.0000 mg | ORAL_TABLET | ORAL | Status: DC

## 2024-11-30 MED ORDER — METHYLPREDNISOLONE 4 MG PO TBPK: 8.0000 mg | ORAL_TABLET | Freq: Every morning | ORAL | Status: DC

## 2024-11-30 MED ORDER — METHYLPREDNISOLONE 4 MG PO TBPK
4.0000 mg | ORAL_TABLET | Freq: Three times a day (TID) | ORAL | Status: DC
  Administered 2024-12-01 (×2): 4 mg via ORAL

## 2024-11-30 MED ORDER — METHYLPREDNISOLONE 4 MG PO TBPK
8.0000 mg | ORAL_TABLET | Freq: Every evening | ORAL | Status: AC
  Administered 2024-11-30: 8 mg via ORAL

## 2024-11-30 MED ORDER — METHYLPREDNISOLONE 4 MG PO TBPK: 4.0000 mg | ORAL_TABLET | Freq: Three times a day (TID) | ORAL | Status: DC

## 2024-11-30 NOTE — Progress Notes (Signed)
 Physical Therapy Treatment Patient Details Name: Eliyas Suddreth MRN: [id number] DOB: 01/27/1946 Today's Date: 11/30/2024   History of Present Illness Pt is a 79 year old man who presented on12/30/25 with increasing confusion and generalized weakness.MRI negative for acute changes, + chronic microvascular disease and cerebral atrophy.  PMH: CHF, HTN, prostate inlargement, retinal vein occlusion, PCM, PAF, low back pain.    PT Comments  Pt tolerated treatment well today. Pt today was able to ambulate around unit with rollator CGA. Cues for safety and proximity to rollator. No change in DC/DME recs at this time. PT will continue to follow.     If plan is discharge home, recommend the following: A little help with walking and/or transfers;A little help with bathing/dressing/bathroom;Assistance with cooking/housework;Assist for transportation   Can travel by private vehicle        Equipment Recommendations  None recommended by PT    Recommendations for Other Services       Precautions / Restrictions Precautions Precautions: Fall Recall of Precautions/Restrictions: Impaired Restrictions Weight Bearing Restrictions Per Provider Order: No     Mobility  Bed Mobility Overal bed mobility: Needs Assistance Bed Mobility: Supine to Sit, Sit to Supine     Supine to sit: Min assist, HOB elevated Sit to supine: Min assist   General bed mobility comments: assist to raise trunk and for LEs back into bed    Transfers Overall transfer level: Needs assistance Equipment used: Rollator (4 wheels) Transfers: Sit to/from Stand Sit to Stand: Contact guard assist, Min assist           General transfer comment: Assist for safety. Cues for hand placement and Min A to steady.    Ambulation/Gait Ambulation/Gait assistance: Contact guard assist Gait Distance (Feet): 600 Feet Assistive device: Rollator (4 wheels) Gait Pattern/deviations: Step-through pattern, Decreased stride length Gait  velocity: decr     General Gait Details: CGA with rollator. Cues for proximity to rollator and locking brakes.   Stairs             Wheelchair Mobility     Tilt Bed    Modified Rankin (Stroke Patients Only)       Balance Overall balance assessment: Needs assistance Sitting-balance support: No upper extremity supported Sitting balance-Leahy Scale: Fair     Standing balance support: Single extremity supported, Bilateral upper extremity supported Standing balance-Leahy Scale: Poor Standing balance comment: UE support                            Communication Communication Communication: Impaired Factors Affecting Communication: Hearing impaired;Other (comment) (daughter reports change to speech clarity)  Cognition Arousal: Alert Behavior During Therapy: WFL for tasks assessed/performed   PT - Cognitive impairments: Problem solving, Memory                       PT - Cognition Comments: Slow to process Following commands: Intact      Cueing Cueing Techniques: Verbal cues  Exercises      General Comments General comments (skin integrity, edema, etc.): VSS      Pertinent Vitals/Pain Pain Assessment Pain Assessment: No/denies pain    Home Living                          Prior Function            PT Goals (current goals can now be found in the care plan  section) Acute Rehab PT Goals Patient Stated Goal: go home PT Goal Formulation: With patient/family Time For Goal Achievement: 12/13/24 Potential to Achieve Goals: Good Progress towards PT goals: Progressing toward goals    Frequency    Min 2X/week      PT Plan      Co-evaluation              AM-PAC PT 6 Clicks Mobility   Outcome Measure  Help needed turning from your back to your side while in a flat bed without using bedrails?: A Little Help needed moving from lying on your back to sitting on the side of a flat bed without using bedrails?: A  Little Help needed moving to and from a bed to a chair (including a wheelchair)?: A Little Help needed standing up from a chair using your arms (e.g., wheelchair or bedside chair)?: A Little Help needed to walk in hospital room?: A Little Help needed climbing 3-5 steps with a railing? : A Little 6 Click Score: 18    End of Session Equipment Utilized During Treatment: Gait belt Activity Tolerance: Patient tolerated treatment well Patient left: in bed;with call bell/phone within reach;with family/visitor present Nurse Communication: Mobility status PT Visit Diagnosis: Other abnormalities of gait and mobility (R26.89);Muscle weakness (generalized) (M62.81);Pain Pain - Right/Left: Left Pain - part of body:  (buttock)     Time: 8699-8681 PT Time Calculation (min) (ACUTE ONLY): 18 min  Charges:    $Gait Training: 8-22 mins PT General Charges $$ ACUTE PT VISIT: 1 Visit                     Sueellen NOVAK, PT, DPT Acute Rehab Services [phone]    Winford Hehn 11/30/2024, 3:40 PM

## 2024-11-30 NOTE — Progress Notes (Signed)
 [facility]  Taft Spiegel FMW:[id number]  DOB: [birth year]  DOA: 11/28/2024  PCP: Gable Cambric, MD  11/30/2024,1:51 PM  LOS: 0 days    Code Status: Full code     from: Home   79 year old male Ascending aortic aneurysm without rupture Paroxysmal A-fib diagnosed 10/2020 on flecainide  complete heart block with PPM Saint Jude medical Assurity MRI conditional---  Previous pericardial fusion 2.6 cm last EF 01/06/2021 = grade 2 diastolic dysfunction 50-55% EF  Worked up and felt to be secondary to pericarditis treated with colchicine  for 3 months  Also had a left pleural effusion 11/24/2020 drained in the hospital as well felt to be secondary to heart failure?  12/30 present from home slightly more confused-unable to make television work and was trying to use his phone to make it work instead of using the remote control 12/31 MRI brain rules out acute stroke some microvascular changes CT spine send severe central canal stenosis L3-L5-L5-L6-severe left neural foraminal stenosis at L4-5 with left neural foraminal narrowing L5-6--transitional lumbar anatomy 6 lumbar-type segments lowest designated L6--incomplete segmentation L2-L3 with right lateral body bridging-severe degenerative disc disease throughout rest of lumbar spine    Assessment  & Plan :    Toxic metabolic encephalopathy-2/2 AKI causing delirium-low back pain causing delirium TSH B12 mag Calcitonin CK ammonia-all negative except for low-grade troponin elevation 39-40 Discussed with wife-in detail delirium has many causes pain/urinary retention/AKI can cause the same in addition to constipation-he is high functioning so we will watch for resolution of the same keep sleep-wake will cycle --- discussed with daughter at the bedside as well who lives with parents and she tells me that overall he has not changed much I suspect that he will improve better at home in the next several days  AKI on admission-PTA Lasix  as needed 20, lisinopril  10 at  bedtime Baseline BUN/creatinine 21/1.1-on arrival here 27/1.4 no acidosis--labs are improved Saline bolus given, continue 40 cc/H--I's/O+ 300 Current on lower dose of ibuprofen  400 3 times daily   Uncontrolled LBP CT scan back does not show acute fracture (x-ray was very equivocal)-does show severe stenosis-?  Discussed case with neurosurgery on-call who will set up for [facility] as an outpatient with acute care team Starting Medrol Dosepak in hospital see if helps-watch for gastric irritation  Uncontrolled HTN Add amlodipine 10, as needed for blood pressure hydralazine  if the MRI does not show any stroke  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% 2022 Underlying history of idiopathic pericarditis status posttreatment Previous left pleural effusion probably secondary to acute heart failure in the remote past Meds adjusted as above Monitor trends  A-fib CHADVASC >4 Complete heart block with Saint Jude MR conditional PPM Telemetry discontinued-seems to have PVCs and paced rhythm  Low-grade temp Resolved no new issue   Data Reviewed today: Sodium 138 potassium 4.1 chloride 104 BUN/creatinine 21/1.1 WBC 10.4 hemoglobin 12.2 platelet 208  DVT prophylaxis: Xarelto    Dispo/Global plan: Inpatient -hopeful for discharge tomorrow dependent on    Subjective:   Pain seems improved mentation seems about the same no chest pain no fever no chills no nausea no vomiting ROM intact he is actually able to walk in the hall is able to get to the restroom-at home he was able to climb stairs and this was less painful and walking flat surface  Objective + exam Vitals:   11/30/24 0400 11/30/24 0648 11/30/24 0817 11/30/24 1320  BP: (!) 144/80  (!) 157/64 (!) 154/133  Pulse: 71  65 95  Resp:  17  18 18   Temp: 98.5 F (36.9 C)  97.9 F (36.6 C) (!) 97.4 F (36.3 C)  TempSrc: Oral  Oral Oral  SpO2: 95%  99% 97%  Weight:  73.5 kg     Filed Weights   11/30/24 0648  Weight: 73.5 kg     Examination:   Alert  ambulatory Smiling A little bit hard of hearing CTAB no added sound ROM intact Power 5/5 Telemetry shows paced rhythm with some PVC  Scheduled Meds:  amLODipine  10 mg Oral Daily   finasteride   5 mg Oral QPC supper   ibuprofen   400 mg Oral TID   rivaroxaban   20 mg Oral Q supper   rosuvastatin   5 mg Oral QPC supper   Continuous Infusions: acetaminophen  **OR** acetaminophen , hydrALAZINE , melatonin, ondansetron  (ZOFRAN ) IV, oxyCODONE  Time 46  Colen Grimes, MD  [facility]

## 2024-11-30 NOTE — Plan of Care (Signed)
  Problem: Clinical Measurements: Goal: Ability to maintain clinical measurements within normal limits will improve Outcome: Progressing Goal: Will remain free from infection Outcome: Progressing Goal: Diagnostic test results will improve Outcome: Progressing Goal: Respiratory complications will improve Outcome: Progressing Goal: Cardiovascular complication will be avoided Outcome: Progressing   Problem: Nutrition: Goal: Adequate nutrition will be maintained Outcome: Progressing   Problem: Coping: Goal: Level of anxiety will decrease Outcome: Progressing   Problem: Elimination: Goal: Will not experience complications related to bowel motility Outcome: Progressing Goal: Will not experience complications related to urinary retention Outcome: Progressing   Problem: Pain Managment: Goal: General experience of comfort will improve and/or be controlled Outcome: Progressing   Problem: Safety: Goal: Ability to remain free from injury will improve Outcome: Progressing   Problem: Skin Integrity: Goal: Risk for impaired skin integrity will decrease Outcome: Progressing

## 2024-11-30 NOTE — TOC Initial Note (Signed)
 Transition of Care [facility]) - Initial/Assessment Note    Patient Details  Name: Ricky Hood MRN: [id number] Date of Birth: 11-14-1946  [facility]) CM/SW Contact:    Ricky KANDICE Stain, RN Phone Number: 11/30/2024, 2:32 PM  Clinical Narrative:                 Spoke to patient and wife at bedside regarding transition needs. Patient admitted to [facility] on 11/28/2024 with acute encephalopathy. Patient lives with spouse and has a cane and rollator. Wife is agreeable to home health. This RNCM offered choice for Home Health, Ricky Hood's wife, Ricky Hood, states she has no preference, RNCM made referral to [facility] with Ricky Hood, He is able to take referral.  Patient has all needed DME. Address, Phone number and PCP verified.   Expected Discharge Plan: Home w Home Health Services Barriers to Discharge: Continued Medical Work up   Patient Goals and CMS Choice Patient states their goals for this hospitalization and ongoing recovery are:: return home CMS Medicare.gov Compare Post Acute Care list provided to:: Patient Represenative (must comment) Choice offered to / list presented to : Spouse      Expected Discharge Plan and Services   Discharge Planning Services: CM Consult Post Acute Care Choice: Home Health Living arrangements for the past 2 months: Single Family Home                           HH Arranged: OT, PT HH Agency: [facility] Date [facility] Agency Contacted: 11/30/24 Time HH Agency Contacted: 1431 Representative spoke with at [facility] Agency: Ricky Hood  Prior Living Arrangements/Services Living arrangements for the past 2 months: Single Family Home Lives with:: Spouse Patient language and need for interpreter reviewed:: Yes Do you feel safe going back to the place where you live?: Yes      Need for Family Participation in Patient Care: Yes (Comment) Care giver support system in place?: Yes (comment) Current home services: DME (cane,  rollator) Criminal Activity/Legal Involvement Pertinent to Current Situation/Hospitalization: No - Comment as needed  Activities of Daily Living   ADL Screening (condition at time of admission) Independently performs ADLs?: Yes (appropriate for developmental age) Is the patient deaf or have difficulty hearing?: Yes Does the patient have difficulty seeing, even when wearing glasses/contacts?: No Does the patient have difficulty concentrating, remembering, or making decisions?: Yes  Permission Sought/Granted Permission sought to share information with : Facility Engineer, Maintenance (it) granted to share info w AGENCY: home health        Emotional Assessment Appearance:: Appears stated age     Orientation: : Oriented to  Time, Oriented to Place, Oriented to Self Alcohol / Substance Use: Not Applicable Psych Involvement: No (comment)  Admission diagnosis:  Acute encephalopathy [G93.40] Altered mental status, unspecified altered mental status type [R41.82] Patient Active Problem List   Diagnosis Date Noted   Elevated troponin 11/29/2024   Dehydration 11/29/2024   Acute prerenal azotemia 11/29/2024   History of essential hypertension 11/29/2024   BPH (benign prostatic hyperplasia) 11/29/2024   Acute encephalopathy 11/28/2024   Hypercholesteremia    Second degree AV block    Pericardial effusion 11/24/2020   Paroxysmal atrial fibrillation (HCC) 11/24/2020   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   Aneurysm of ascending aorta 11/24/2020   Hypertension 11/24/2020   HLD (hyperlipidemia) 11/24/2020   Hyponatremia 11/24/2020   Pleural effusion    Acute diastolic heart  failure (HCC) 11/15/2020   St Jude Medical Assurity MRI conditional  dual-chamber pacemaker for symptomatic complete heart block   09/24/2020   Presence of permanent cardiac pacemaker 09/17/2020   Complete heart block (HCC) 09/16/2020   Hypersomnia 08/10/2014   Obstructive sleep apnea (adult) (pediatric)  08/10/2014   Snoring 08/10/2014   PCP:  Ricky Cambric, MD Pharmacy:   [facility] - [place], [place] - [address] [place] [address] Phone: [phone] Fax: [phone]  [facility] [place], [place] Hood - [address] [place] [address] Phone: [phone] Fax: [phone]     Social Drivers of Health (SDOH) Social History: SDOH Screenings   Food Insecurity: No Food Insecurity (11/29/2024)  Housing: Low Risk (11/29/2024)  Transportation Needs: No Transportation Needs (11/29/2024)  Utilities: Not At Risk (11/29/2024)  Social Connections: Socially Integrated (11/29/2024)  Tobacco Use: Low Risk (11/29/2024)   SDOH Interventions:     Readmission Risk Interventions     No data to display

## 2024-11-30 NOTE — Plan of Care (Signed)

## 2024-11-30 NOTE — Plan of Care (Signed)
" °  Problem: Education: Goal: Knowledge of General Education information will improve Description: Including pain rating scale, medication(s)/side effects and non-pharmacologic comfort measures 11/30/2024 1856 by Harolyn Alan POUR, RN Outcome: Progressing 11/30/2024 1855 by Harolyn Alan POUR, RN Outcome: Progressing   Problem: Health Behavior/Discharge Planning: Goal: Ability to manage health-related needs will improve 11/30/2024 1856 by Harolyn Alan POUR, RN Outcome: Progressing 11/30/2024 1855 by Harolyn Alan POUR, RN Outcome: Progressing   Problem: Clinical Measurements: Goal: Ability to maintain clinical measurements within normal limits will improve 11/30/2024 1856 by Harolyn Alan POUR, RN Outcome: Progressing 11/30/2024 1855 by Harolyn Alan POUR, RN Outcome: Progressing   "

## 2024-12-01 ENCOUNTER — Other Ambulatory Visit (HOSPITAL_COMMUNITY): Payer: Self-pay

## 2024-12-01 ENCOUNTER — Telehealth: Payer: Self-pay | Admitting: Cardiology

## 2024-12-01 DIAGNOSIS — G9340 Encephalopathy, unspecified: Secondary | ICD-10-CM | POA: Diagnosis not present

## 2024-12-01 LAB — URINE CULTURE: Culture: NO GROWTH

## 2024-12-01 MED ORDER — METHYLPREDNISOLONE 4 MG PO TABS
ORAL_TABLET | ORAL | 0 refills | Status: AC
  Filled 2024-12-01: qty 13, 7d supply, fill #0

## 2024-12-01 MED ORDER — IBUPROFEN 400 MG PO TABS: 400.0000 mg | ORAL_TABLET | Freq: Three times a day (TID) | ORAL | Status: AC

## 2024-12-01 MED ORDER — AMLODIPINE BESYLATE 10 MG PO TABS
10.0000 mg | ORAL_TABLET | Freq: Every day | ORAL | 0 refills | Status: AC
  Filled 2024-12-01: qty 30, 30d supply, fill #0

## 2024-12-01 MED ORDER — OXYCODONE HCL 5 MG PO TABS
5.0000 mg | ORAL_TABLET | ORAL | 0 refills | Status: AC | PRN
  Filled 2024-12-01: qty 30, 5d supply, fill #0

## 2024-12-01 NOTE — Progress Notes (Signed)
 Occupational Therapy Treatment Patient Details Name: Ricky Hood MRN: [id number] DOB: 04-04-1946 Today's Date: 12/01/2024   History of present illness Pt is a 79 year old man who presented on12/30/25 with increasing confusion and generalized weakness.MRI negative for acute changes, + chronic microvascular disease and cerebral atrophy.  PMH: CHF, HTN, prostate inlargement, retinal vein occlusion, PCM, PAF, low back pain.   OT comments  Pt seen for OT treatment, agreeable for session. Pt seen for cog screens today. He demo'd passing score on Short Blessed Test - 6/28. Pt able to provide sufficient answers to emergency preparedness questions. Participated in pillbox test with FAILING score - exceeded the 5 minute time allotted and with 22 errors by commission and omission. Difficult to understand if pt uses a pillbox at home or not, but would recommend strict supervision/assist with medication and financial mgmt upon d/c. Will continue to follow acutely.      If plan is discharge home, recommend the following:  A little help with walking and/or transfers;A little help with bathing/dressing/bathroom;Assistance with cooking/housework;Assist for transportation;Help with stairs or ramp for entrance   Equipment Recommendations  None recommended by OT    Recommendations for Other Services      Precautions / Restrictions Precautions Precautions: Fall Recall of Precautions/Restrictions: Impaired Restrictions Weight Bearing Restrictions Per Provider Order: No       Mobility Bed Mobility Overal bed mobility: Needs Assistance Bed Mobility: Supine to Sit, Sit to Supine     Supine to sit: Supervision Sit to supine: Supervision   General bed mobility comments: incr effort/time, used bed rails    Transfers                         Balance Overall balance assessment: Needs assistance Sitting-balance support: No upper extremity supported, Feet supported Sitting balance-Leahy  Scale: Good Sitting balance - Comments: seated EOB, no overt LOB noted                                   ADL either performed or assessed with clinical judgement   ADL Overall ADL's : Needs assistance/impaired Eating/Feeding: Independent   Grooming: Contact guard assist                               Functional mobility during ADLs: Contact guard assist;Rollator (4 wheels);Minimal assistance      Extremity/Trunk Assessment              Vision       Perception     Praxis     Communication Communication Communication: Impaired   Cognition Arousal: Alert Behavior During Therapy: WFL for tasks assessed/performed Cognition: Difficult to assess Difficult to assess due to: Hard of hearing/deaf           OT - Cognition Comments: pt tangential, 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; redirectable with effort                 Following commands: Intact        Cueing   Cueing Techniques: Verbal cues  Exercises      Shoulder Instructions       General Comments pt completed pillbox test today with 22 errors by commission/omission    Pertinent Vitals/ Pain       Pain Assessment Pain Assessment: No/denies pain  Home Living  Prior Functioning/Environment              Frequency  Min 2X/week        Progress Toward Goals  OT Goals(current goals can now be found in the care plan section)  Progress towards OT goals: Progressing toward goals     Plan      Co-evaluation                 AM-PAC OT 6 Clicks Daily Activity     Outcome Measure   Help from another person eating meals?: None Help from another person taking care of personal grooming?: A Little Help from another person toileting, which includes using toliet, bedpan, or urinal?: A Little Help from another person bathing (including washing, rinsing, drying)?: A  Little Help from another person to put on and taking off regular upper body clothing?: A Little Help from another person to put on and taking off regular lower body clothing?: A Little 6 Click Score: 19    End of Session    OT Visit Diagnosis: Unsteadiness on feet (R26.81);Other abnormalities of gait and mobility (R26.89);Muscle weakness (generalized) (M62.81);Pain   Activity Tolerance Patient tolerated treatment well   Patient Left in bed;with call bell/phone within reach;with family/visitor present   Nurse Communication Mobility status        Time: 0829-0909 OT Time Calculation (min): 40 min  Charges: OT General Charges $OT Visit: 1 Visit OT Treatments $Therapeutic Activity: 38-52 mins  Bob Eastwood M. [place], OTR/L [facility] Acute Rehabilitation Services [phone] Secure Chat Preferred  [name] 12/01/2024, 10:30 AM

## 2024-12-01 NOTE — Telephone Encounter (Signed)
"  Left vm to return call.  "

## 2024-12-01 NOTE — Discharge Summary (Signed)
 Physician Discharge Summary  Ricky Hood FMW:[id number] DOB: 01/04/1946 DOA: 11/28/2024  PCP: Gable Cambric, MD  Admit date: 11/28/2024 Discharge date: 12/01/2024  Time spent: 47 minutes  Recommendations for Outpatient Follow-up:  Patient to follow-up with Dr. Claudene of [facility] neurosurgery to ensure that has follow-up for consideration of epidural steroid injections of back Medrol Dosepak prescribed for pain control of spinal stenosis--can use Tylenol  ibuprofen  and oxy in a graduated fashion for pain control Would avoid long-term diuretics and ACE inhibitor if can given his advanced age and risk for is a azotemia which could have contributed to his confusion Does require outpatient slums/MoCA testing  Discharge Diagnoses:  MAIN problem for hospitalization   Mild metabolic encephalopathy Lumbago secondary to severe spinal stenosis  Please see below for itemized issues addressed in HOpsital- refer to other progress notes for clarity if needed  Discharge Condition: Improved  Diet recommendation: Heart healthy  Filed Weights   11/30/24 0648 12/01/24 0500  Weight: 73.5 kg 71.8 kg    History of present illness:  79 year old male Ascending aortic aneurysm without rupture Paroxysmal A-fib diagnosed 10/2020 on flecainide  complete heart block with PPM Saint Jude medical Assurity MRI conditional---  Previous pericardial fusion 2.6 cm last EF 01/06/2021 = grade 2 diastolic dysfunction 50-55% EF             Worked up and felt to be secondary to pericarditis treated with colchicine  for 3 months             Also had a left pleural effusion 11/24/2020 drained in the hospital as well felt to be secondary to heart failure?   12/30 present from home slightly more confused-unable to make television work and was trying to use his phone to make it work instead of using the remote control 12/31 MRI brain rules out acute stroke some microvascular changes CT spine send severe central canal stenosis  L3-L5-L5-L6-severe left neural foraminal stenosis at L4-5 with left neural foraminal narrowing L5-6--transitional lumbar anatomy 6 lumbar-type segments lowest designated L6--incomplete segmentation L2-L3 with right lateral body bridging-severe degenerative disc disease throughout rest of lumbar spine      Assessment  & Plan :      Toxic metabolic encephalopathy-2/2 AKI causing delirium-low back pain causing delirium TSH B12 mag Calcitonin CK ammonia-all negative except for low-grade troponin elevation 39-40 Discussed with wife-in detail delirium has many causes pain/urinary retention/AKI can cause the same in addition to constipation-he is high functioning so we will watch for resolution of the same keep sleep-wake will cycle --- discussed with daughter at the bedside --she lives with both parents and can be available to help patient and patient's wife at home He is stable for discharge but will need formalized testing for mild cognitive issues  AKI on admission-PTA Lasix  as needed 20, lisinopril  10 at bedtime Baseline BUN/creatinine 21/1.1-on arrival here 27/1.4 no acidosis--labs are improved Saline bolus given, continue 40 cc/H--I's/O+ 300 Current on lower dose of ibuprofen  400 3 times daily  Meds adjusted as per below at discharge   Uncontrolled LBP CT scan back does not show acute fracture (x-ray was very equivocal)-does show severe stenosis-?  Discussed case with neurosurgery on-call Dr. Penne Claudene who will set up for [facility] as an outpatient with acute care team-I will CC him Starting Medrol Dosepak in hospital and will discharge on low-dose of this going forward   Uncontrolled HTN Add amlodipine 10,--- we had to hold lisinopril  10 at DC and we we will pause Lasix  for a while  until labs can be done in the outpatient setting and resume it when they are done   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% 2022 Underlying history of idiopathic pericarditis status posttreatment Previous left pleural effusion  probably secondary to acute heart failure in the remote past Meds adjusted as above Monitor trends   A-fib CHADVASC >4 Complete heart block with Saint Jude MR conditional PPM Telemetry discontinued-seems to have PVCs and paced rhythm   Low-grade temp Resolved no new issue    Discharge Exam: Vitals:   12/01/24 0051 12/01/24 0600  BP: (!) 152/77 (!) 167/78  Pulse: 92 77  Resp: 18 18  Temp: 98.1 F (36.7 C) 98.4 F (36.9 C)  SpO2: 95% 97%    Subj on day of d/c   Looks well feels fair no distress No nausea no vomiting no chest pain no fever no chills no blurred vision no double vision He was a little agitated overnight but he is completely coherent to me time place person and just wanted to get out of bed Can tell me date time year president Power is 5/5  Instructions   Discharge Instructions     Discharge instructions   Complete by: As directed    Take steroid taper as indicated Use tylenol  1st choice, ibuprofen  [with food] 2nd choice Oxycodone 3rd choice  Use a bowel regimen for constipation  FOllow with Neurosurgery---their office should contact you to discuss needs for further work-up of pain   Increase activity slowly   Complete by: As directed       Allergies as of 12/01/2024   No Known Allergies      Medication List     PAUSE taking these medications    furosemide  20 MG tablet Wait to take this until: December 05, 2024 Commonly known as: LASIX  Take 1 tablet (20 mg total) by mouth daily as needed (for swelling).       STOP taking these medications    lisinopril  10 MG tablet Commonly known as: ZESTRIL        TAKE these medications    acetaminophen  650 MG CR tablet Commonly known as: TYLENOL  Take 650-1,300 mg by mouth every 8 (eight) hours as needed for pain.   amLODipine 10 MG tablet Commonly known as: NORVASC Take 1 tablet (10 mg total) by mouth daily.   ascorbic acid 500 MG tablet Commonly known as: VITAMIN C Take 500 mg by mouth  daily with supper.   CoQ10 100 MG Caps Take 100-200 mg by mouth daily with supper.   finasteride  5 MG tablet Commonly known as: PROSCAR  Take 5 mg by mouth daily with supper.   fish oil-omega-3 fatty acids 1000 MG capsule Take 3 g by mouth daily with supper.   ibuprofen  400 MG tablet Commonly known as: ADVIL  Take 1 tablet (400 mg total) by mouth 3 (three) times daily. What changed:  medication strength how much to take   loratadine 10 MG tablet Commonly known as: CLARITIN Take 10 mg by mouth daily with supper.   methylPREDNISolone 4 MG tablet Commonly known as: Medrol Take 1 tablet (4 mg total) by mouth 2 (two) times daily for 1 day, THEN 1 tablet (4 mg total) 2 (two) times daily for 3 days, THEN 1 tablet (4 mg total) daily for 3 days. Start taking on: December 01, 2024   oxyCODONE 5 MG immediate release tablet Commonly known as: Oxy IR/ROXICODONE Take 1 tablet (5 mg total) by mouth every 4 (four) hours as needed for severe  pain (pain score 7-10).   rosuvastatin  5 MG tablet Commonly known as: CRESTOR  Take 5 mg by mouth daily with supper.   SENIOR MULTIVITAMIN PLUS PO Take 1 tablet by mouth daily with supper.   TART CHERRY PO Take 30 mLs by mouth 2 (two) times daily with a meal.   Vitamin D3 50 MCG (2000 UT) Tabs Take 2,000 Units by mouth daily with supper.   Xarelto  20 MG Tabs tablet Generic drug: rivaroxaban  TAKE 1 TABLET BY MOUTH DAILY WITH SUPPER       Allergies[1]  Contact information for follow-up providers     Claudene Penne ORN, MD Follow up.   Specialty: Neurosurgery Why: If you do not hear from this office by Monday to schedule, please reach out to the number to help schedule an appointment for your spinal issues Contact information: [address] [place] [address] [phone]              Contact information for after-discharge care     Home Medical Care     [facility]) .   Service: Home Health  Services Contact information: [address] [place]  [address] [phone]                      The results of significant diagnostics from this hospitalization (including imaging, microbiology, ancillary and laboratory) are listed below for reference.    Significant Diagnostic Studies: CT LUMBAR SPINE WO CONTRAST Result Date: 11/30/2024 EXAM: CT OF THE LUMBAR SPINE WITHOUT CONTRAST 11/30/2024 12:30:59 AM TECHNIQUE: CT of the lumbar spine was performed without the administration of intravenous contrast. Multiplanar reformatted images are provided for review. Automated exposure control, iterative reconstruction, and/or weight based adjustment of the mA/kV was utilized to reduce the radiation dose to as low as reasonably achievable. COMPARISON: None available. CLINICAL HISTORY: Compression fracture, lumbar. FINDINGS: BONES AND ALIGNMENT: Transitional lumbar anatomy with 6 non-weightbearing segments of the lumbar spine. The lowest segment is termed L6 for purposes of this study. There is incomplete segmentation of L2-L3 with bony bridging of the right lateral aspect of the vertebral bodies. No significant disc space identified at L2-L3. Mild lumbar levoscoliosis, apex left at L3. Vertebral body height is preserved. No acute fracture or suspicious bone lesion. The osseous structures are diffusely osteopenic. DEGENERATIVE CHANGES: Severe disc space narrowing, endplate remodeling, and vacuum disc phenomenon involving the remaining intervertebral disc spaces of the lumbar spine in keeping with changes of diffuse superior degenerative disc disease. At L4-L5, a posterior disc bulge results in severe central canal stenosis. An eccentric disc bulge and facet hypertrophy result in severe left neural foraminal narrowing at this level. At L5-L6, a broad-based disc bulge, laminar hypertrophy, and facet arthrosis result in severe central canal stenosis. Moderate left neural foraminal  narrowing. SOFT TISSUES: No acute abnormality. IMPRESSION: 1. No acute fracture. 2. Severe central canal stenosis at L4-5 and L5-L6. 3. Severe left neural foraminal narrowing at L4-5 and moderate left neural foraminal narrowing at L5-L6. 4. Transitional lumbar anatomy with 6 lumbar-type segments, with the lowest designated L6. 5. Incomplete segmentation at L2-3 with right lateral bony bridging. 6. Mild lumbar levoscoliosis, apex left at L3. 7. Severe diffuse degenerative disc disease throughout the remaining lumbar levels. 8. Diffuse osteopenia. Electronically signed by: Dorethia Molt MD 11/30/2024 12:45 AM EST RP Workstation: [id number]   DG Lumbar Spine 2-3 Views Result Date: 11/29/2024 EXAM: 2 or 3 VIEW(S) XRAY OF THE  LUMBAR SPINE 11/29/2024 04:32:00 PM COMPARISON: CT abdomen and pelvis 01/26/2023. CLINICAL HISTORY: Lumbago. FINDINGS: LUMBAR SPINE: BONES: Mild levoconvex curvature of the lumbar spine, unchanged from prior. Evaluation is limited secondary to overlying soft tissues and diffuse osteopenia. Mild compression deformity cannot be excluded at the L2 level which would be new from the prior study. DISCS AND DEGENERATIVE CHANGES: Intervertebral disc space narrowing and endplate osteophyte formation throughout the spine compatible with degenerative change most significant at L1-L2 and L2-L3. SOFT TISSUES: No acute abnormality. IMPRESSION: 1. Evaluation is limited by overlying soft tissues and diffuse osteopenia; mild compression deformity at L2 cannot be excluded and may be new. If there is high clinical concern for fracture, recommend CT for further evaluation. 2. Mild levoconvex curvature of the lumbar spine with multilevel degenerative change. Electronically signed by: Greig Pique MD 11/29/2024 05:23 PM EST RP Workstation: [id number]   ECHOCARDIOGRAM COMPLETE Result Date: 11/29/2024    ECHOCARDIOGRAM REPORT   Patient Name:   Ricky Hood Date of Exam: 11/29/2024 Medical Rec #:  [id number]       Height:       67.0 in Accession #:    [id number]     Weight:       163.0 lb Date of Birth:  Mar 11, 1946      BSA:          1.854 m Patient Age:    78 years       BP:           148/62 mmHg Patient Gender: M              HR:           73 bpm. Exam Location:  Inpatient Procedure: 2D Echo, Cardiac Doppler and Color Doppler (Both Spectral and Color            Flow Doppler were utilized during procedure). Indications:    Elevated Troponin  History:        Patient has prior history of Echocardiogram examinations, most                 recent 01/06/2021. Risk Factors:Hypertension.  Sonographer:    Nathanel Devonshire Referring Phys: [id number] JUSTIN B HOWERTER IMPRESSIONS  1. Left ventricular ejection fraction, by estimation, is 60 to 65%. The left ventricle has normal function. The left ventricle has no regional wall motion abnormalities. There is moderate left ventricular hypertrophy. Left ventricular diastolic parameters are consistent with Grade I diastolic dysfunction (impaired relaxation).  2. Right ventricular systolic function is normal. The right ventricular size is normal.  3. The mitral valve is normal in structure. No evidence of mitral valve regurgitation. No evidence of mitral stenosis.  4. The aortic valve is calcified. Aortic valve regurgitation is trivial. No aortic stenosis is present.  5. Aortic dilatation noted. There is mild dilatation of the ascending aorta, measuring 45 mm.  6. The inferior vena cava is normal in size with greater than 50% respiratory variability, suggesting right atrial pressure of 3 mmHg. FINDINGS  Left Ventricle: Left ventricular ejection fraction, by estimation, is 60 to 65%. The left ventricle has normal function. The left ventricle has no regional wall motion abnormalities. The left ventricular internal cavity size was normal in size. There is  moderate left ventricular hypertrophy. Left ventricular diastolic parameters are consistent with Grade I diastolic dysfunction (impaired relaxation).  Right Ventricle: The right ventricular size is normal. Right ventricular systolic function is normal. Left Atrium: Left atrial size was normal in size. Right Atrium:  Right atrial size was normal in size. Pericardium: Trivial pericardial effusion is present. Mitral Valve: The mitral valve is normal in structure. No evidence of mitral valve regurgitation. No evidence of mitral valve stenosis. Tricuspid Valve: The tricuspid valve is normal in structure. Tricuspid valve regurgitation is mild . No evidence of tricuspid stenosis. Aortic Valve: The aortic valve is calcified. Aortic valve regurgitation is trivial. No aortic stenosis is present. Aortic valve mean gradient measures 8.0 mmHg. Aortic valve peak gradient measures 17.1 mmHg. Aortic valve area, by VTI measures 2.33 cm. Pulmonic Valve: The pulmonic valve was not well visualized. Pulmonic valve regurgitation is not visualized. No evidence of pulmonic stenosis. Aorta: Aortic dilatation noted. There is mild dilatation of the ascending aorta, measuring 45 mm. Venous: The inferior vena cava is normal in size with greater than 50% respiratory variability, suggesting right atrial pressure of 3 mmHg. IAS/Shunts: No atrial level shunt detected by color flow Doppler. Additional Comments: A device lead is visualized.  LEFT VENTRICLE PLAX 2D LVIDd:         4.80 cm     Diastology LVIDs:         3.40 cm     LV e' medial:    8.70 cm/s LV PW:         0.90 cm     LV E/e' medial:  7.4 LV IVS:        1.30 cm     LV e' lateral:   5.55 cm/s LVOT diam:     2.00 cm     LV E/e' lateral: 11.6 LV SV:         88 LV SV Index:   47 LVOT Area:     3.14 cm LV IVRT:       95 msec  LV Volumes (MOD) LV vol d, MOD A2C: 84.6 ml LV vol d, MOD A4C: 70.8 ml LV vol s, MOD A2C: 37.3 ml LV vol s, MOD A4C: 25.7 ml LV SV MOD A2C:     47.3 ml LV SV MOD A4C:     70.8 ml LV SV MOD BP:      50.6 ml RIGHT VENTRICLE             IVC RV Basal diam:  3.50 cm     IVC diam: 2.10 cm RV S prime:     13.70 cm/s TAPSE  (M-mode): 2.6 cm      PULMONARY VEINS                             Diastolic Velocity: 47.90 cm/s                             S/D Velocity:       1.60                             Systolic Velocity:  78.50 cm/s LEFT ATRIUM             Index        RIGHT ATRIUM           Index LA diam:        3.30 cm 1.78 cm/m   RA Area:     19.10 cm LA Vol (A2C):   55.3 ml 29.83 ml/m  RA Volume:   53.00 ml  28.59 ml/m LA Vol (A4C):  30.4 ml 16.40 ml/m LA Biplane Vol: 42.7 ml 23.03 ml/m  AORTIC VALVE                     PULMONIC VALVE AV Area (Vmax):    2.20 cm      PV Vmax:       1.64 m/s AV Area (Vmean):   2.44 cm      PV Peak grad:  10.8 mmHg AV Area (VTI):     2.33 cm AV Vmax:           207.00 cm/s AV Vmean:          135.000 cm/s AV VTI:            0.377 m AV Peak Grad:      17.1 mmHg AV Mean Grad:      8.0 mmHg LVOT Vmax:         145.00 cm/s LVOT Vmean:        105.000 cm/s LVOT VTI:          0.280 m LVOT/AV VTI ratio: 0.74  AORTA Ao Root diam: 3.90 cm Ao Asc diam:  4.20 cm MITRAL VALVE MV Area (PHT): 3.27 cm    SHUNTS MV Decel Time: 232 msec    Systemic VTI:  0.28 m MV E velocity: 64.50 cm/s  Systemic Diam: 2.00 cm MV A velocity: 90.10 cm/s MV E/A ratio:  0.72 Redell Shallow MD Electronically signed by Redell Shallow MD Signature Date/Time: 11/29/2024/2:51:23 PM    Final    MR BRAIN WO CONTRAST Result Date: 11/29/2024 EXAM: MRI BRAIN WITHOUT CONTRAST 11/29/2024 08:57:37 AM TECHNIQUE: Multiplanar multisequence MRI of the head/brain was performed without the administration of intravenous contrast. COMPARISON: CT head without contrast 09/16/2020. CLINICAL HISTORY: Delirium. FINDINGS: BRAIN AND VENTRICLES: Infarcts are present in the right thalamus. No intracranial hemorrhage. No mass. No midline shift. No hydrocephalus. Periventricular and subcortical T2 hyperintensities are present bilaterally. T2 hyperintensity extends into the brain stem. The sella is unremarkable. Normal flow voids. ORBITS: No acute abnormality.  SINUSES AND MASTOIDS: A fluid level is present in the right sphenoid sinus. Moderate mucosal thickening is present in the right ethmoid air cells. BONES AND SOFT TISSUES: Normal marrow signal. No acute soft tissue abnormality. IMPRESSION: 1. No acute intracranial abnormality to account for delirium. 2. 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 This most likely reflects the sequelae of chronic microvascular ischemia. 3. Right sphenoid sinus air-fluid level and right ethmoid mucosal thickening, which can be seen with acute sinusitis. Electronically signed by: Lonni Necessary MD 11/29/2024 09:12 AM EST RP Workstation: [id number]   CT Head Wo Contrast Result Date: 11/28/2024 EXAM: CT HEAD WITHOUT CONTRAST 11/28/2024 09:05:00 PM TECHNIQUE: CT of the head was performed without the administration of intravenous contrast. Automated exposure control, iterative reconstruction, and/or weight based adjustment of the mA/kV was utilized to reduce the radiation dose to as low as reasonably achievable. COMPARISON: None available. CLINICAL HISTORY: Mental status change, unknown cause. FINDINGS: BRAIN AND VENTRICLES: No acute hemorrhage. No evidence of acute infarct. No hydrocephalus. No extra-axial collection. No mass effect or midline shift. Patchy white matter hypodensities, compatible with chronic microvascular ischemic disease. Cerebral atrophy. Calcific atherosclerosis. ORBITS: No acute abnormality. SINUSES: Mild paranasal sinus mucosal thickening. SOFT TISSUES AND SKULL: No acute soft tissue abnormality. No skull fracture. IMPRESSION: 1. No acute intracranial abnormality. 2. Patchy white matter hypodensities, compatible with chronic microvascular ischemic disease. 3. Cerebral atrophy. Electronically signed by: Franky Stanford MD 11/28/2024  09:34 PM EST RP Workstation: [id number]   DG Chest Port 1 View Result Date: 11/28/2024 EXAM: 1 VIEW(S) XRAY OF THE CHEST 11/28/2024 07:49:00 PM  COMPARISON: Chest x ray 11/24/2020. CLINICAL HISTORY: weakness FINDINGS: LINES, TUBES AND DEVICES: Left subclavian dual lead pacemaker in place. LUNGS AND PLEURA: Low lung volumes. Calcified granuloma at right lung base. No pleural effusion. No pneumothorax. HEART AND MEDIASTINUM: Cardiomegaly. Calcified aorta. Left subclavian dual lead pacemaker in place. BONES AND SOFT TISSUES: Degenerative changes of bilateral shoulders, left greater than right. No acute osseous abnormality. IMPRESSION: 1. No acute cardiopulmonary abnormality. Electronically signed by: Greig Pique MD 11/28/2024 09:04 PM EST RP Workstation: [id number]    Microbiology: Recent Results (from the past 240 hours)  Resp panel by RT-PCR (RSV, Flu A&B, Covid) Anterior Nasal Swab     Status: None   Collection Time: 11/28/24  8:00 PM   Specimen: Anterior Nasal Swab  Result Value Ref Range Status   SARS Coronavirus 2 by RT PCR NEGATIVE NEGATIVE Final   Influenza A by PCR NEGATIVE NEGATIVE Final   Influenza B by PCR NEGATIVE NEGATIVE Final    Comment: (NOTE) The Xpert Xpress SARS-CoV-2/FLU/RSV plus assay is intended as an aid in the diagnosis of influenza from Nasopharyngeal swab specimens and should not be used as a sole basis for treatment. Nasal washings and aspirates are unacceptable for Xpert Xpress SARS-CoV-2/FLU/RSV testing.  Fact Sheet for Patients: bloggercourse.com  Fact Sheet for Healthcare Providers: seriousbroker.it  This test is not yet approved or cleared by the [place]  FDA and has been authorized for detection and/or diagnosis of SARS-CoV-2 by FDA under an Emergency Use Authorization (EUA). This EUA will remain in effect (meaning this test can be used) for the duration of the COVID-19 declaration under Section 564(b)(1) of the Act, 21 U.S.C. section 360bbb-3(b)(1), unless the authorization is terminated or revoked.     Resp Syncytial Virus by PCR NEGATIVE  NEGATIVE Final    Comment: (NOTE) Fact Sheet for Patients: bloggercourse.com  Fact Sheet for Healthcare Providers: seriousbroker.it  This test is not yet approved or cleared by the [place]  FDA and has been authorized for detection and/or diagnosis of SARS-CoV-2 by FDA under an Emergency Use Authorization (EUA). This EUA will remain in effect (meaning this test can be used) for the duration of the COVID-19 declaration under Section 564(b)(1) of the Act, 21 U.S.C. section 360bbb-3(b)(1), unless the authorization is terminated or revoked.  Performed at [facility], [address] [place], [place] [address]   Urine Culture     Status: None   Collection Time: 11/28/24 10:03 PM   Specimen: Urine, Clean Catch  Result Value Ref Range Status   Specimen Description URINE, CLEAN CATCH  Final   Special Requests NONE  Final   Culture   Final    NO GROWTH Performed at [facility], [address] [place], [place] [address]    Report Status 12/01/2024 FINAL  Final     Labs: Basic Metabolic Panel: Recent Labs  Lab 11/28/24 2000 11/28/24 2021 11/29/24 0354 11/30/24 0311  NA 141 141 142 138  K 4.1 4.0 4.1 4.1  CL 106 106 109 104  CO2 23  --  23 24  GLUCOSE 92 91 98 113*  BUN 27* 27* 25* 21  CREATININE 1.23 1.40* 1.13 1.13  CALCIUM  9.2  --  8.8* 8.9  MG 2.1  --  2.1  --    Liver Function Tests: Recent Labs  Lab  11/28/24 2000 11/29/24 0354  AST 19 18  ALT 15 14  ALKPHOS 78 74  BILITOT 0.4 0.4  PROT 7.0 6.6  ALBUMIN 3.7 3.5   No results for input(s): LIPASE, AMYLASE in the last 168 hours. Recent Labs  Lab 11/29/24 0354  AMMONIA 23   CBC: Recent Labs  Lab 11/28/24 2000 11/28/24 2021 11/29/24 0354 11/30/24 0311  WBC 9.7  --  7.8 10.4  NEUTROABS 7.0  --  5.4 7.1  HGB 13.3 12.9* 12.4* 12.2*  HCT 39.1 38.0* 36.8* 35.9*  MCV 95.4  --  95.8 95.5  PLT 222  --  213 208   Cardiac  Enzymes: Recent Labs  Lab 11/29/24 0354  CKTOTAL 106   BNP: BNP (last 3 results) No results for input(s): BNP in the last 8760 hours.  ProBNP (last 3 results) No results for input(s): PROBNP in the last 8760 hours.  CBG: Recent Labs  Lab 11/28/24 1823 11/28/24 1900  GLUCAP 90 92    Signed:  Colen Grimes MD   [facility] 12/01/2024, 9:07 AM      [1] No Known Allergies

## 2024-12-01 NOTE — Progress Notes (Signed)
 Pt was aggressive and refused to let me give him hydralazine  for this morning SBP of 167.

## 2024-12-01 NOTE — TOC Transition Note (Signed)
 Transition of Care [facility]) - Discharge Note   Patient Details  Name: Ricky Hood MRN: [id number] Date of Birth: 10/26/1946  Transition of Care [facility]) CM/SW Contact:  Andrez JULIANNA George, RN Phone Number: 12/01/2024, 9:34 AM   Clinical Narrative:     Pt is discharging home with home health services through [facility]. Information on the AVS. Hedda will contact him for the first home visit.  Pt has transportation home.  Final next level of care: Home w Home Health Services Barriers to Discharge: No Barriers Identified   Patient Goals and CMS Choice Patient states their goals for this hospitalization and ongoing recovery are:: return home CMS Medicare.gov Compare Post Acute Care list provided to:: Patient Represenative (must comment) Choice offered to / list presented to : Spouse      Discharge Placement                       Discharge Plan and Services Additional resources added to the After Visit Summary for     Discharge Planning Services: CM Consult Post Acute Care Choice: Home Health                    HH Arranged: OT, PT [facility] Agency: [facility] Date [facility] Agency Contacted: 11/30/24 Time HH Agency Contacted: [id number] Representative spoke with at [facility] Agency: Darleene  Social Drivers of Health (SDOH) Interventions SDOH Screenings   Food Insecurity: No Food Insecurity (11/29/2024)  Housing: Low Risk (11/29/2024)  Transportation Needs: No Transportation Needs (11/29/2024)  Utilities: Not At Risk (11/29/2024)  Social Connections: Socially Integrated (11/29/2024)  Tobacco Use: Low Risk (11/29/2024)     Readmission Risk Interventions     No data to display

## 2024-12-01 NOTE — Telephone Encounter (Signed)
 Pt  wife called in and stated that the patient was in the [place] for 12/30 to today.  They changed his meds and they would like to know why the meds where changed.  They would like Dr Edwyna to look at. She is hoping for a call before the weekend.    Best number [phone]

## 2024-12-01 NOTE — Plan of Care (Signed)

## 2024-12-05 NOTE — Telephone Encounter (Signed)
 Ricky Hood states that they spoke with Dr. Edwyna and had no additional questions.

## 2024-12-07 DIAGNOSIS — R0609 Other forms of dyspnea: Secondary | ICD-10-CM | POA: Diagnosis not present

## 2024-12-07 DIAGNOSIS — I351 Nonrheumatic aortic (valve) insufficiency: Secondary | ICD-10-CM | POA: Diagnosis not present

## 2024-12-08 DIAGNOSIS — R0609 Other forms of dyspnea: Secondary | ICD-10-CM | POA: Diagnosis not present

## 2024-12-13 ENCOUNTER — Ambulatory Visit

## 2024-12-14 ENCOUNTER — Telehealth: Payer: Self-pay | Admitting: Cardiology

## 2024-12-14 NOTE — Telephone Encounter (Signed)
 Pt missed his home pacer check due to being in [facility] re[facility]

## 2024-12-14 NOTE — Telephone Encounter (Signed)
 Spoke w/ patient wife regarding missed transmission d/t patient being in [place]. States remote monitor is at their home in their bedroom. States patient will be at Rehab approx. 1 more week. Advised patient if able to do so she can take monitor from home and plug it in patient room at Rehab. Patient wife states she is unable to do so.  Informed it is ok to leave monitor plugged in at home if unable to take to Rehab facility and once patient returns home it will connect and send a transmission if needed. Advised patient to contact device clinic if patient stay at Rehab is longer than 1 week or if anything changes. Verbalized understanding.   Will continue to monitor and update accordingly.

## 2024-12-22 ENCOUNTER — Ambulatory Visit: Attending: Cardiology

## 2024-12-22 DIAGNOSIS — I441 Atrioventricular block, second degree: Secondary | ICD-10-CM | POA: Diagnosis not present

## 2024-12-25 LAB — CUP PACEART REMOTE DEVICE CHECK
Battery Remaining Longevity: 62 mo
Battery Remaining Percentage: 54 %
Battery Voltage: 2.99 V
Brady Statistic AP VP Percent: 16 %
Brady Statistic AP VS Percent: 1 %
Brady Statistic AS VP Percent: 83 %
Brady Statistic AS VS Percent: 1 %
Brady Statistic RA Percent Paced: 15 %
Brady Statistic RV Percent Paced: 99 %
Date Time Interrogation Session: 20260123224135
Implantable Lead Connection Status: 753985
Implantable Lead Connection Status: 753985
Implantable Lead Implant Date: 20211019
Implantable Lead Implant Date: 20211019
Implantable Lead Location: 753859
Implantable Lead Location: 753860
Implantable Pulse Generator Implant Date: 20211019
Lead Channel Impedance Value: 350 Ohm
Lead Channel Impedance Value: 440 Ohm
Lead Channel Pacing Threshold Amplitude: 0.5 V
Lead Channel Pacing Threshold Amplitude: 0.75 V
Lead Channel Pacing Threshold Pulse Width: 0.5 ms
Lead Channel Pacing Threshold Pulse Width: 0.5 ms
Lead Channel Sensing Intrinsic Amplitude: 5 mV
Lead Channel Sensing Intrinsic Amplitude: 7.9 mV
Lead Channel Setting Pacing Amplitude: 1 V
Lead Channel Setting Pacing Amplitude: 1.5 V
Lead Channel Setting Pacing Pulse Width: 0.5 ms
Lead Channel Setting Sensing Sensitivity: 4 mV
Pulse Gen Model: 2272
Pulse Gen Serial Number: 6220383

## 2024-12-26 ENCOUNTER — Ambulatory Visit: Payer: Self-pay | Admitting: Cardiology

## 2024-12-26 NOTE — Progress Notes (Signed)
 Remote PPM Transmission

## 2024-12-27 ENCOUNTER — Encounter (HOSPITAL_COMMUNITY): Payer: Self-pay

## 2024-12-27 ENCOUNTER — Other Ambulatory Visit (HOSPITAL_COMMUNITY): Payer: Self-pay | Admitting: Student

## 2024-12-27 DIAGNOSIS — M5416 Radiculopathy, lumbar region: Secondary | ICD-10-CM

## 2025-01-04 ENCOUNTER — Telehealth: Payer: Self-pay | Admitting: Cardiology

## 2025-01-04 NOTE — Telephone Encounter (Signed)
 Pt c/o medication issue:  1. Name of Medication:   ibuprofen  (ADVIL ) 400 MG tablet    2. How are you currently taking this medication (dosage and times per day)? As written   3. Are you having a reaction (difficulty breathing--STAT)? No   4. What is your medication issue? Primary care had concerns of pt being on this amount of medication due to having heart concerns  Pt would like a call back from Dr Elfredia

## 2025-01-29 ENCOUNTER — Ambulatory Visit: Admitting: Cardiology

## 2025-02-07 ENCOUNTER — Other Ambulatory Visit (HOSPITAL_COMMUNITY)

## 2025-02-08 ENCOUNTER — Ambulatory Visit: Admitting: Cardiology

## 2025-03-14 ENCOUNTER — Ambulatory Visit

## 2025-03-23 ENCOUNTER — Ambulatory Visit

## 2025-06-13 ENCOUNTER — Ambulatory Visit

## 2025-06-22 ENCOUNTER — Ambulatory Visit

## 2025-09-12 ENCOUNTER — Ambulatory Visit

## 2025-09-21 ENCOUNTER — Ambulatory Visit

## 2025-12-12 ENCOUNTER — Ambulatory Visit

## 2025-12-21 ENCOUNTER — Ambulatory Visit

## 2026-03-13 ENCOUNTER — Ambulatory Visit
